# Patient Record
Sex: Female | Born: 1950 | Race: Black or African American | Hispanic: No | Smoking: Current every day smoker
Health system: Southern US, Community
[De-identification: ages and names within clinical notes are randomized; demographics above are authoritative.]

## PROBLEM LIST (undated history)

## (undated) DIAGNOSIS — Z87442 Personal history of urinary calculi: Secondary | ICD-10-CM

## (undated) DIAGNOSIS — A630 Anogenital (venereal) warts: Secondary | ICD-10-CM

## (undated) DIAGNOSIS — F17200 Nicotine dependence, unspecified, uncomplicated: Secondary | ICD-10-CM

## (undated) DIAGNOSIS — R911 Solitary pulmonary nodule: Secondary | ICD-10-CM

## (undated) DIAGNOSIS — I10 Essential (primary) hypertension: Secondary | ICD-10-CM

## (undated) DIAGNOSIS — T7840XA Allergy, unspecified, initial encounter: Secondary | ICD-10-CM

## (undated) DIAGNOSIS — C3491 Malignant neoplasm of unspecified part of right bronchus or lung: Secondary | ICD-10-CM

## (undated) DIAGNOSIS — H269 Unspecified cataract: Secondary | ICD-10-CM

## (undated) DIAGNOSIS — E785 Hyperlipidemia, unspecified: Secondary | ICD-10-CM

## (undated) DIAGNOSIS — K219 Gastro-esophageal reflux disease without esophagitis: Secondary | ICD-10-CM

## (undated) HISTORY — DX: Gastro-esophageal reflux disease without esophagitis: K21.9

## (undated) HISTORY — DX: Anogenital (venereal) warts: A63.0

## (undated) HISTORY — PX: OOPHORECTOMY: SHX86

## (undated) HISTORY — PX: ABDOMINAL HYSTERECTOMY: SHX81

## (undated) HISTORY — DX: Hyperlipidemia, unspecified: E78.5

## (undated) HISTORY — DX: Solitary pulmonary nodule: R91.1

## (undated) HISTORY — DX: Nicotine dependence, unspecified, uncomplicated: F17.200

## (undated) HISTORY — DX: Unspecified cataract: H26.9

## (undated) HISTORY — DX: Allergy, unspecified, initial encounter: T78.40XA

---

## 2014-01-28 ENCOUNTER — Emergency Department: Payer: Self-pay | Admitting: Emergency Medicine

## 2015-02-18 ENCOUNTER — Ambulatory Visit: Payer: Self-pay | Admitting: Family Medicine

## 2015-11-20 ENCOUNTER — Emergency Department: Payer: Commercial Managed Care - HMO

## 2015-11-20 ENCOUNTER — Encounter: Payer: Self-pay | Admitting: Emergency Medicine

## 2015-11-20 ENCOUNTER — Emergency Department
Admission: EM | Admit: 2015-11-20 | Discharge: 2015-11-20 | Disposition: A | Discharge status: Home / Self Care | Payer: Commercial Managed Care - HMO | Attending: Emergency Medicine | Admitting: Emergency Medicine

## 2015-11-20 DIAGNOSIS — M7121 Synovial cyst of popliteal space [Baker], right knee: Secondary | ICD-10-CM

## 2015-11-20 DIAGNOSIS — M79604 Pain in right leg: Secondary | ICD-10-CM | POA: Diagnosis present

## 2015-11-20 DIAGNOSIS — F17210 Nicotine dependence, cigarettes, uncomplicated: Secondary | ICD-10-CM | POA: Insufficient documentation

## 2015-11-20 MED ORDER — FUROSEMIDE 20 MG PO TABS: 20.0000 mg | ORAL_TABLET | Freq: Every day | ORAL | 0 refills | Status: DC

## 2015-11-20 MED ORDER — TRAMADOL HCL 50 MG PO TABS: 50.0000 mg | ORAL_TABLET | Freq: Two times a day (BID) | ORAL | 0 refills | Status: DC | PRN

## 2015-11-20 NOTE — ED Provider Notes (Signed)
[facility] Emergency Department Provider Note   ____________________________________________   First MD Initiated Contact with Patient 11/20/15 1429     (approximate)  I have reviewed the triage vital signs and the nursing notes.   HISTORY  Chief Complaint Leg Pain    HPI Joanna Morris is a 65 y.o. female history complaining of right leg pain for 1 week. Patient denies any provocative incident for this complaint. Patient stated pain is located in the popliteal area of the right leg. No palliative measures for this complaint. Patient denies any chest pain or shortness of breath. Patient rated the pain as a 10 over 10. Patient described a pain as "achy". Patient states she's noticed some swelling behind her right knee. Patient stated palpation of the popliteal area is uncomfortable.   History reviewed. No pertinent past medical history.  There are no active problems to display for this patient.   Past Surgical History:  Procedure Laterality Date  . ABDOMINAL HYSTERECTOMY      Prior to Admission medications   Medication Sig Start Date End Date Taking? Authorizing Provider  furosemide (LASIX) 20 MG tablet Take 1 tablet (20 mg total) by mouth daily. 11/20/15 11/19/16  Sable Feil, PA-C  traMADol (ULTRAM) 50 MG tablet Take 1 tablet (50 mg total) by mouth every 12 (twelve) hours as needed for moderate pain. 11/20/15   Sable Feil, PA-C    Allergies Review of patient's allergies indicates no known allergies.  No family history on file.  Social History Social History  Substance Use Topics  . Smoking status: Current Every Day Smoker    Packs/day: 1.00    Types: Cigarettes  . Smokeless tobacco: Not on file  . Alcohol use Not on file    Review of Systems Constitutional: No fever/chills Eyes: No visual changes. ENT: No sore throat. Cardiovascular: Denies chest pain. Respiratory: Denies shortness of breath. Gastrointestinal: No abdominal  pain.  No nausea, no vomiting.  No diarrhea.  No constipation. Genitourinary: Negative for dysuria. Musculoskeletal: Right leg pain Skin: Negative for rash. Neurological: Negative for headaches, focal weakness or numbness.    ____________________________________________   PHYSICAL EXAM:  VITAL SIGNS: ED Triage Vitals  Enc Vitals Group     BP 11/20/15 1239 (!) 146/86     Pulse Rate 11/20/15 1239 69     Resp 11/20/15 1239 18     Temp 11/20/15 1239 97.6 F (36.4 C)     Temp Source 11/20/15 1239 Oral     SpO2 11/20/15 1239 94 %     Weight 11/20/15 1240 200 lb (90.7 kg)     Height 11/20/15 1240 '5\' 1"'$  (1.549 m)     Head Circumference --      Peak Flow --      Pain Score 11/20/15 1240 10     Pain Loc --      Pain Edu? --      Excl. in [place]? --     Constitutional: Alert and oriented. Well appearing and in no acute distress. Eyes: Conjunctivae are normal. PERRL. EOMI. Head: Atraumatic. Nose: No congestion/rhinnorhea. Mouth/Throat: Mucous membranes are moist.  Oropharynx non-erythematous. Neck: No stridor.  No cervical spine tenderness to palpation. Hematological/Lymphatic/Immunilogical: No cervical lymphadenopathy. Cardiovascular: Normal rate, regular rhythm. Grossly normal heart sounds.  Good peripheral circulation. Respiratory: Normal respiratory effort.  No retractions. Lungs CTAB. Gastrointestinal: Soft and nontender. No distention. No abdominal bruits. No CVA tenderness. Musculoskeletal:No obvious deformity of the right lower leg. There is  marked edema approximately 35 cm circumference at the superior aspect of the tibia.. Versus 32 cm of the left superior aspect of the tibia. Patient is marked guarding with palpation of the right popliteal area. Neurologic:  Normal speech and language. No gross focal neurologic deficits are appreciated. No gait instability. Skin:  Skin is warm, dry and intact. No rash noted. Psychiatric: Mood and affect are normal. Speech and behavior are  normal.  ____________________________________________   LABS (all labs ordered are listed, but only abnormal results are displayed)  Labs Reviewed - No data to display ____________________________________________  EKG   ____________________________________________  RADIOLOGY  Ultrasound was negative for DVT. Ultrasound did reveal a Baker's cyst in the right popliteal area. ____________________________________________   PROCEDURES  Procedure(s) performed: None  Procedures  Critical Care performed: No  ____________________________________________   INITIAL IMPRESSION / ASSESSMENT AND PLAN / ED COURSE  Pertinent labs & imaging results that were available during my care of the patient were reviewed by me and considered in my medical decision making (see chart for details).  Right leg pain and edema. Baker's cyst. Discussed ultrasound found with patient. Patient given discharge Instructions. Patient given a prescription for tramadol and Lasix. Patient advised to follow-up with scheduled appointment with PCP next week. Return by ER for condition worsens.  Clinical Course     ____________________________________________   FINAL CLINICAL IMPRESSION(S) / ED DIAGNOSES  Final diagnoses:  Right leg pain  Baker's cyst of knee, right      NEW MEDICATIONS STARTED DURING THIS VISIT:  New Prescriptions   FUROSEMIDE (LASIX) 20 MG TABLET    Take 1 tablet (20 mg total) by mouth daily.   TRAMADOL (ULTRAM) 50 MG TABLET    Take 1 tablet (50 mg total) by mouth every 12 (twelve) hours as needed for moderate pain.     Note:  This document was prepared using Dragon voice recognition software and may include unintentional dictation errors.    Sable Feil, PA-C 11/20/15 [place], MD 11/20/15 [id number]

## 2015-11-20 NOTE — ED Triage Notes (Signed)
R leg pain x 1 week, denies injury.

## 2015-11-20 NOTE — ED Notes (Signed)
Ultrasound resulted, pt moved to flex wait.

## 2015-12-10 ENCOUNTER — Ambulatory Visit (INDEPENDENT_AMBULATORY_CARE_PROVIDER_SITE_OTHER): Payer: Commercial Managed Care - HMO | Admitting: Family Medicine

## 2015-12-10 ENCOUNTER — Encounter: Payer: Self-pay | Admitting: Family Medicine

## 2015-12-10 VITALS — BP 140/90 | HR 79 | Temp 99.0°F | Resp 16 | Ht 62.0 in | Wt 209.8 lb

## 2015-12-10 DIAGNOSIS — I10 Essential (primary) hypertension: Secondary | ICD-10-CM | POA: Insufficient documentation

## 2015-12-10 DIAGNOSIS — M25561 Pain in right knee: Secondary | ICD-10-CM | POA: Diagnosis not present

## 2015-12-10 DIAGNOSIS — Z720 Tobacco use: Secondary | ICD-10-CM | POA: Diagnosis not present

## 2015-12-10 DIAGNOSIS — Z7689 Persons encountering health services in other specified circumstances: Secondary | ICD-10-CM

## 2015-12-10 DIAGNOSIS — Z87891 Personal history of nicotine dependence: Secondary | ICD-10-CM | POA: Insufficient documentation

## 2015-12-10 DIAGNOSIS — Z131 Encounter for screening for diabetes mellitus: Secondary | ICD-10-CM | POA: Diagnosis not present

## 2015-12-10 DIAGNOSIS — J3089 Other allergic rhinitis: Secondary | ICD-10-CM | POA: Insufficient documentation

## 2015-12-10 DIAGNOSIS — R030 Elevated blood-pressure reading, without diagnosis of hypertension: Secondary | ICD-10-CM | POA: Diagnosis not present

## 2015-12-10 DIAGNOSIS — Z13220 Encounter for screening for lipoid disorders: Secondary | ICD-10-CM | POA: Diagnosis not present

## 2015-12-10 DIAGNOSIS — E669 Obesity, unspecified: Secondary | ICD-10-CM | POA: Diagnosis not present

## 2015-12-10 MED ORDER — NAPROXEN 500 MG PO TABS: 500.0000 mg | ORAL_TABLET | Freq: Two times a day (BID) | ORAL | 0 refills | Status: DC

## 2015-12-10 NOTE — Assessment & Plan Note (Signed)
Subacute Right medial knee pain >1 month now, with assoc localized swelling without current joint effusion. 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 Possibly consider just severe knee sprain.  Plan: 1. Maximize conservative therapy first - Naproxen 500 BID wc x 2-3 weeks regularly then PRN, Tylenol breakthrough 2. RICE therapy, knee compression sleeve, ice packs for swelling 3. Avoid over use and re injury 4. Hold imaging unlikely fracture, possible underlying OA contributing 5. Follow-up 4 weeks, if still persistent worsening, consider steroid injection and referral to Ortho for further eval

## 2015-12-10 NOTE — Assessment & Plan Note (Addendum)
Obese, BMI >38, limited prior follow-up, concern for elevated BP and possible new HTN dx. No recent DM screening, concern with family history - Future fasting lipid panel and A1c, chemistry ordered - Follow-up lifestyle recommendations

## 2015-12-10 NOTE — Assessment & Plan Note (Signed)
Active smoker, 0.5ppd >25 year. Prior quit for 2 years without treatment Not ready to quit today. Following smoking cessation.

## 2015-12-10 NOTE — Assessment & Plan Note (Signed)
Concern for borderline HTN vs new dx HTN, prior readings elevated. No prior formal dx. Most likely mild HTN given age and risk factors. Fam history. Recent acute Knee injury also confounding, pain may be raising BP. - Check labs with future fasting chemistry, lipids, A1c - Follow-up 4 weeks re-check BP, if still elevated, dx HTN and start low dose therapy

## 2015-12-10 NOTE — Patient Instructions (Signed)
Thank you for coming in to clinic today.  1. For your Knee pain - I think you sprained your knee, but am concerned you may have injured your Meniscus, shock absorber cartilage in knee  Recommend trial of Anti-inflammatory with Naproxen (Naprosyn) '500mg'$  tabs - take one with food and plenty of water TWICE daily every day (breakfast and dinner), for next 2 weeks, then you may take only as needed - DO NOT TAKE any ibuprofen, aleve, motrin while you are taking this medicine - It is safe to take Tylenol Ext Str '500mg'$  tabs - take 1 to 2 (max dose '1000mg'$ ) every 6 hours as needed for breakthrough pain, max 24 hour daily dose is 6 to 8 tablets or '4000mg'$   Keep using knee sleeve for compression, ice packs, and elevation (toes above nose, or above heart level) Avoid over use and try to limit activity  Blood pressure mildly elevated, we will check again.  If you are interested in quitting smoking let us know.  Mammogram screening, call number on card given.  Send [place] results from your Cologuard or colon cancer screening test  You will be due for fasting blood work before your next follow-up Two options: 1. About 1 to 2 weeks before your next appointment, please schedule a separate "Lab Only" visit here at the clinic to draw your blood only. Prefer early morning, open at 8:00am. You need to be fasting (No food or drink after midnight, and nothing except water / coffee (no cream or sugar) in the morning before your blood draw).   Please schedule a follow-up appointment with Dr. Parks Ranger in 4 weeks to follow-up Right Knee Pain / Lab results / Blood pressure re-check   If you have any other questions or concerns, please feel free to call the clinic or send a message through [place]. You may also schedule an earlier appointment if necessary.  Nobie Putnam, DO [place]

## 2015-12-10 NOTE — Progress Notes (Signed)
Subjective:    Patient ID: Joanna Morris, female    DOB: [birth year], 65 y.o.   MRN: [id number]  Joanna Morris is a 65 y.o. female presenting on 12/10/2015 for Establish Care  No prior PCP.  HPI  RIGHT KNEE PAIN MEDIAL: - Reports recent problem over past >1 month ago with acute Right knee injury. See recent record from ED 11/20/15, dx with R popliteal baker's cyst on [place] (negative for DVT) and given tramadol and lasix for edema. - Today patient reports persistent pain in Right knee. States initial injury while she was walking around a corner and turned to the left with right leg planted and twisted, she felt a "pop" behind her Right knee, and had some pain and her knee "locked up" difficulty straightening it. She was able to put weight on it initially. Overall still persistent and it is affecting her ambulation, pain is usually only with activities. Swelling has improved. Worse with driving and putting pressure on that leg. - No OTC medications. Tried Tramadol x1 from ED but made her itch so she stopped. Tried Lasix for swelling with improvement then stopped - Tried knee compression sleeve with improvement - No prior history of DVT. No prior history of knee injury, injection, surgery  ELEVATED BP / OBESITY BMI >38 - Reports prior history of some borderline elevated BP. No formal dx of HTN. Never treated with anti-HTN therapy. Thinks she has some family members with HTN but unsure. Mother passed with MI and had DM. - No known history of abnormal cholesterol, cannot recall last lab draw - Does not check BP at home  Seasonal Environmental Allergies: - Chronic problem, well controlled recently, uses Flonase as needed  TOBACCO ABUSE: - Chronic smoker >25 years, 0.5ppd currently. Quit only once before for 2 years, cold [place]. Never tried NRT - Not ready to quit.  Health Maintenance: - Last Flu shot she got sick, declines again, counseled on risks benefits today - She plans to order  cologuard through her insurance, and is awaiting on the kit to arrive, will forward results - Last mammo 2012, normal, wants repeat screen, no fam history of breast cancer   Past Medical History:  Diagnosis Date  . Allergy   . Cataract   . Genital warts   . GERD (gastroesophageal reflux disease)   . Kidney stone    Social History   Social History  . Marital status: Divorced    Spouse name: N/A  . Number of children: N/A  . Years of education: High School   Occupational History  . Retired    Social History Main Topics  . Smoking status: Current Every Day Smoker    Packs/day: 0.50    Years: 25.00    Types: Cigarettes  . Smokeless tobacco: Current User     Comment: Quit once for 2 years.  . Alcohol use Yes     Comment: 1-3 drinks every other week.  . Drug use: No  . Sexual activity: Not on file   Other Topics Concern  . Not on file   Social History Narrative  . No narrative on file   Family History  Problem Relation Age of Onset  . Heart disease Mother 61    MI  . Heart attack Mother 29  . Diabetes Mother    No current outpatient prescriptions on file prior to visit.   No current facility-administered medications on file prior to visit.     Review of Systems  Constitutional:  Negative for activity change, appetite change, chills, diaphoresis, fatigue and fever.  HENT: Negative for congestion, hearing loss and sinus pressure.   Eyes: Negative for visual disturbance.  Respiratory: Negative for cough, chest tightness, shortness of breath and wheezing.   Cardiovascular: Negative for chest pain, palpitations and leg swelling.  Gastrointestinal: Negative for abdominal pain, constipation, diarrhea, nausea and vomiting.  Endocrine: Negative for cold intolerance and polyuria.  Genitourinary: Negative for frequency and hematuria.  Musculoskeletal: Positive for arthralgias (Right knee), gait problem and joint swelling. Negative for back pain and neck pain.  Skin:  Negative for rash.  Allergic/Immunologic: Positive for environmental allergies.  Neurological: Negative for dizziness, weakness, light-headedness, numbness and headaches.  Hematological: Negative for adenopathy.  Psychiatric/Behavioral: Negative for behavioral problems, confusion, dysphoric mood and sleep disturbance.   Per HPI unless specifically indicated above     Objective:    BP 140/90 (BP Location: Left Arm, Cuff Size: Normal)   Pulse 79   Temp 99 F (37.2 C) (Oral)   Resp 16   Ht '5\' 2"'  (1.575 m)   Wt 209 lb 12.8 oz (95.2 kg)   BMI 38.37 kg/m   Wt Readings from Last 3 Encounters:  12/10/15 209 lb 12.8 oz (95.2 kg)  11/20/15 200 lb (90.7 kg)    Physical Exam  Constitutional: She is oriented to person, place, and time. She appears well-developed and well-nourished. No distress.  Well-appearing, comfortable, cooperative, obese  HENT:  Head: Normocephalic and atraumatic.  Mouth/Throat: Oropharynx is clear and moist.  Eyes: Conjunctivae and EOM are normal. Pupils are equal, round, and reactive to light.  Neck: Normal range of motion. Neck supple. No thyromegaly present.  Cardiovascular: Normal rate, regular rhythm, normal heart sounds and intact distal pulses.   No murmur heard. Pulmonary/Chest: Effort normal and breath sounds normal. No respiratory distress. She has no wheezes. She has no rales.  Abdominal: Soft. Bowel sounds are normal. She exhibits no distension and no mass. There is no tenderness.  Musculoskeletal: Normal range of motion. She exhibits no edema.  Bilateral Knees Inspection: Mild Right medial surrounding soft tissue edema, not consistent with joint effusion. No erythema. Palpation: Moderate +TTP Right knee only medial joint line and posteriorly. No crepitus ROM: mostly full active ROM, slightly limited R knee flexion due to pain. Special Testing: Lachman / Valgus/Varus tests negative with intact ligaments (ACL, MCL, LCL). McMurray R medial positive with  pain but no palpable pop or click. Standing Thessaly meniscus testing positive R medial for pain. Strength: 5/5 intact knee flex/ext, ankle dorsi/plantarflex Neurovascular: distally intact sensation light touch and pulses Gait is antalgic with favoring Left leg   Lymphadenopathy:    She has no cervical adenopathy.  Neurological: She is alert and oriented to person, place, and time.  Skin: Skin is warm and dry. No rash noted. She is not diaphoretic.  Psychiatric: She has a normal mood and affect. Her behavior is normal.  Nursing note and vitals reviewed.  Reviewed recent imaging radiology report 11/20/15 with Right Lower Extremity Venous US: IMPRESSION: No evidence of right lower extremity deep venous thrombosis. Small right popliteal fossa Baker's cyst.   No results found for this or any previous visit.    Assessment & Plan:   Problem List Items Addressed This Visit    Tobacco abuse    Active smoker, 0.5ppd >25 year. Prior quit for 2 years without treatment Not ready to quit today. Following smoking cessation.      Right medial knee pain - Primary  Subacute Right medial knee pain >1 month now, with assoc localized swelling without current joint effusion. 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 Possibly consider just severe knee sprain.  Plan: 1. Maximize conservative therapy first - Naproxen 500 BID wc x 2-3 weeks regularly then PRN, Tylenol breakthrough 2. RICE therapy, knee compression sleeve, ice packs for swelling 3. Avoid over use and re injury 4. Hold imaging unlikely fracture, possible underlying OA contributing 5. Follow-up 4 weeks, if still persistent worsening, consider steroid injection and referral to Ortho  for further eval      Relevant Medications   naproxen (NAPROSYN) 500 MG tablet   Obesity (BMI 35.0-39.9 without comorbidity)    Obese, BMI >38, limited prior follow-up, concern for elevated BP and possible new HTN dx. No recent DM screening, concern with family history - Future fasting lipid panel and A1c, chemistry ordered - Follow-up lifestyle recommendations      Relevant Orders   COMPLETE METABOLIC PANEL WITH GFR   Lipid panel   Hemoglobin A1c   Environmental and seasonal allergies    Stable, continue Flonase PRN      Elevated blood pressure reading    Concern for borderline HTN vs new dx HTN, prior readings elevated. No prior formal dx. Most likely mild HTN given age and risk factors. Fam history. Recent acute Knee injury also confounding, pain may be raising BP. - Check labs with future fasting chemistry, lipids, A1c - Follow-up 4 weeks re-check BP, if still elevated, dx HTN and start low dose therapy      Relevant Orders   COMPLETE METABOLIC PANEL WITH GFR   Lipid panel   Hemoglobin A1c    Other Visit Diagnoses    Encounter to establish care with new doctor       Screening cholesterol level       Relevant Orders   Lipid panel   Screening for diabetes mellitus       Relevant Orders   Hemoglobin A1c      Meds ordered this encounter  Medications  . naproxen (NAPROSYN) 500 MG tablet    Sig: Take 1 tablet (500 mg total) by mouth 2 (two) times daily with a meal. For 2 weeks, then as needed    Dispense:  60 tablet    Refill:  0      Follow up plan: Return in about 4 weeks (around 01/07/2016) for R knee, , blood pressure, labs.  Nobie Putnam, [facility] 12/10/2015, 5:27 PM

## 2015-12-10 NOTE — Assessment & Plan Note (Signed)
Stable, continue Flonase PRN

## 2015-12-22 LAB — IFOBT (OCCULT BLOOD): IMMUNOLOGICAL FECAL OCCULT BLOOD TEST: NEGATIVE

## 2015-12-23 ENCOUNTER — Other Ambulatory Visit: Payer: Self-pay

## 2015-12-23 DIAGNOSIS — Z131 Encounter for screening for diabetes mellitus: Secondary | ICD-10-CM

## 2015-12-23 DIAGNOSIS — E669 Obesity, unspecified: Secondary | ICD-10-CM

## 2015-12-23 DIAGNOSIS — Z13220 Encounter for screening for lipoid disorders: Secondary | ICD-10-CM

## 2015-12-23 DIAGNOSIS — R030 Elevated blood-pressure reading, without diagnosis of hypertension: Secondary | ICD-10-CM

## 2015-12-24 ENCOUNTER — Other Ambulatory Visit: Payer: Commercial Managed Care - HMO

## 2015-12-24 LAB — COMPLETE METABOLIC PANEL WITH GFR
ALT: 30 U/L — ABNORMAL HIGH (ref 6–29)
AST: 27 U/L (ref 10–35)
Albumin: 3.9 g/dL (ref 3.6–5.1)
Alkaline Phosphatase: 49 U/L (ref 33–130)
BUN: 17 mg/dL (ref 7–25)
CO2: 31 mmol/L (ref 20–31)
Calcium: 9 mg/dL (ref 8.6–10.4)
Chloride: 105 mmol/L (ref 98–110)
Creat: 0.86 mg/dL (ref 0.50–0.99)
GFR, Est African American: 81 mL/min (ref >=60)
GFR, Est Non African American: 70 mL/min (ref >=60)
Glucose, Bld: 105 mg/dL — ABNORMAL HIGH (ref 65–99)
Potassium: 4.7 mmol/L (ref 3.5–5.3)
Sodium: 142 mmol/L (ref 135–146)
Total Bilirubin: 0.6 mg/dL (ref 0.2–1.2)
Total Protein: 6.8 g/dL (ref 6.1–8.1)

## 2015-12-24 LAB — LIPID PANEL
CHOLESTEROL: 192 mg/dL (ref 125–200)
HDL: 54 mg/dL (ref >=46)
LDL CALC: 126 mg/dL (ref <130)
TRIGLYCERIDES: 62 mg/dL (ref <150)
Total CHOL/HDL Ratio: 3.6 Ratio (ref <=5.0)
VLDL: 12 mg/dL (ref <30)

## 2015-12-25 LAB — HEMOGLOBIN A1C
Hgb A1c MFr Bld: 5.5 % (ref <5.7)
Mean Plasma Glucose: 111 mg/dL

## 2016-01-07 ENCOUNTER — Encounter: Payer: Self-pay | Admitting: Family Medicine

## 2016-01-07 ENCOUNTER — Ambulatory Visit (INDEPENDENT_AMBULATORY_CARE_PROVIDER_SITE_OTHER): Payer: Commercial Managed Care - HMO | Admitting: Family Medicine

## 2016-01-07 VITALS — BP 152/78 | HR 72 | Temp 99.3°F | Resp 16 | Ht 62.0 in | Wt 215.6 lb

## 2016-01-07 DIAGNOSIS — M25561 Pain in right knee: Secondary | ICD-10-CM | POA: Diagnosis not present

## 2016-01-07 DIAGNOSIS — R030 Elevated blood-pressure reading, without diagnosis of hypertension: Secondary | ICD-10-CM | POA: Diagnosis not present

## 2016-01-07 MED ORDER — LIDOCAINE HCL (PF) 1 % IJ SOLN
4.0000 mL | Freq: Once | INTRAMUSCULAR | Status: AC
  Administered 2016-01-07: 4 mL

## 2016-01-07 MED ORDER — METHYLPREDNISOLONE ACETATE 40 MG/ML IJ SUSP
40.0000 mg | Freq: Once | INTRAMUSCULAR | Status: AC
  Administered 2016-01-07: 40 mg via INTRA_ARTICULAR

## 2016-01-07 NOTE — Progress Notes (Signed)
Subjective:    Patient ID: Joanna Morris, female    DOB: 08/16/1950, 65 y.o.   MRN: [id number]  Joanna Morris is a 65 y.o. female presenting on 01/07/2016 for Knee Pain (Right side)  HPI  RIGHT KNEE PAIN MEDIAL: - Last visit with me at [facility] on 12/10/15 to establish care, discussed same problem with R medial knee pain, some initial concerns for possible meniscus injury, started on conservative therapy initially with naproxen regularly, activity modification, compression and ice, and advised close follow-up if not improving. - Today patient returns with persistent worsening R medial knee pain (see prior note for details on initial injury, did have a twisting mechanism with feeling a "pop" without traumatic impact). Now severe pain up 9/10 mostly constant, worse with walking, extending knee, and at night. Does admit to a new fall about 2 weeks ago, tried to "plant her weight" on her right leg and it "gave way", without any other injury, and now she is scared ambulating around her home. - Taking Naproxen '500mg'$  regularly BID for 2 weeks, now just as needed, with only minimal relief. Uses topical muscle rub only relieves pain briefly - She could not tolerate Tramadol from ED - No prior history of knee injury, injection, surgery - Denies any fevers/chills, redness of knee joint, other joint pain or swelling  ELEVATED BP without dx HTN - Last visit 12/2015 with elevated BP, but no formal dx of HTN. Never treated with anti-HTN therapy. Family history of HTN. She remains in acute pain and thinks this may be affecting her BP. Also active smoker. - Does not check BP outside of office but will be able to get a cuff - Denies chest pain, shortness of breath, headache, lightheadedness, dizziness, pre-syncope  Social History  Substance Use Topics  . Smoking status: Current Every Day Smoker    Packs/day: 0.50    Years: 25.00    Types: Cigarettes  . Smokeless tobacco: Current User     Comment: Quit  once for 2 years.  . Alcohol use Yes     Comment: 1-3 drinks every other week.   Review of Systems   Per HPI unless specifically indicated above      Objective:    BP (!) 152/78 (BP Location: Right Arm, Cuff Size: Normal)   Pulse 72   Temp 99.3 F (37.4 C) (Oral)   Resp 16   Ht '5\' 2"'$  (1.575 m)   Wt 215 lb 9.6 oz (97.8 kg)   BMI 39.43 kg/m   Wt Readings from Last 3 Encounters:  01/07/16 215 lb 9.6 oz (97.8 kg)  12/10/15 209 lb 12.8 oz (95.2 kg)  11/20/15 200 lb (90.7 kg)    Physical Exam  Constitutional: She is oriented to person, place, and time. She appears well-developed and well-nourished. No distress.  Well-appearing, comfortable, cooperative, obese  HENT:  Head: Normocephalic and atraumatic.  Mouth/Throat: Oropharynx is clear and moist.  Neck: Neck supple.  Cardiovascular: Normal rate and intact distal pulses.   Pulmonary/Chest: Effort normal.  Musculoskeletal: Normal range of motion. She exhibits no edema.  Bilateral Knees - Unchanged from last exam Inspection: Mild Right medial surrounding soft tissue edema, not consistent with joint effusion. No erythema. Palpation: Moderate +TTP Right knee only medial joint line and posteriorly. No crepitus ROM: mostly full active ROM, slightly limited R knee flexion due to pain. Special Testing: Lachman / Valgus/Varus tests negative with intact ligaments (ACL, MCL, LCL). McMurray R medial positive with pain but no  palpable pop or click. Standing Thessaly meniscus testing positive R medial for pain. Strength: 5/5 intact knee flex/ext, ankle dorsi/plantarflex Neurovascular: distally intact sensation light touch and pulses Gait is antalgic with favoring Left leg   Neurological: She is alert and oriented to person, place, and time.  Skin: Skin is warm and dry. No rash noted. She is not diaphoretic.  Nursing note and vitals reviewed.   ________________________________________________________ PROCEDURE NOTE Date: 01/07/16 Right  Knee corticosteroid injection Discussed benefits and risks (including pain, bleeding, infection, steroid flare). Verbal consent given by patient. Medication:  1 cc Depo-medrol '40mg'$  and 4 cc Lidocaine 1% without epi Time Out taken  Landmarks identified. Area cleansed with alcohol wipes.Using 21 gauge and 1, 1/2 inch needle, Right knee joint space was injected (with above listed medication) via medial approach cold spray used for superficial anesthetic.Sterile bandage placed.Patient tolerated procedure well without bleeding or paresthesias.No complications.   I have personally reviewed the following lab results from 12/23/15.  Results for orders placed or performed in visit on 12/23/15  COMPLETE METABOLIC PANEL WITH GFR  Result Value Ref Range   Sodium 142 135 - 146 mmol/L   Potassium 4.7 3.5 - 5.3 mmol/L   Chloride 105 98 - 110 mmol/L   CO2 31 20 - 31 mmol/L   Glucose, Bld 105 (H) 65 - 99 mg/dL   BUN 17 7 - 25 mg/dL   Creat 0.86 0.50 - 0.99 mg/dL   Total Bilirubin 0.6 0.2 - 1.2 mg/dL   Alkaline Phosphatase 49 33 - 130 U/L   AST 27 10 - 35 U/L   ALT 30 (H) 6 - 29 U/L   Total Protein 6.8 6.1 - 8.1 g/dL   Albumin 3.9 3.6 - 5.1 g/dL   Calcium 9.0 8.6 - 10.4 mg/dL   GFR, Est African American 81 >=60 mL/min   GFR, Est Non African American 70 >=60 mL/min  Lipid panel  Result Value Ref Range   Cholesterol 192 125 - 200 mg/dL   Triglycerides 62 <150 mg/dL   HDL 54 >=46 mg/dL   Total CHOL/HDL Ratio 3.6 <=5.0 Ratio   VLDL 12 <30 mg/dL   LDL Cholesterol 126 <130 mg/dL  Hemoglobin A1c  Result Value Ref Range   Hgb A1c MFr Bld 5.5 <5.7 %   Mean Plasma Glucose 111 mg/dL      Assessment & Plan:   Problem List Items Addressed This Visit    Right medial knee pain - Primary    Worsening subacute/chronic R medial knee pain now following twisting injury mechanism. Still significant concern for meniscus injury from exam and history, especially with mechanical symptoms. No improvement on  NSAID conservative therapy. Less likely sprained Knee with worsening now.  Plan: 1. Received R knee corticosteroid injection today, see procedure note for details, tolerated well, counseled on may have initial steroid flare within 24 hours, relative rest next 1-2 days 2. Referral to [facility] for further imaging and evaluation, may need arthroscopic vs surgical management if torn meniscus 3. May continue Naproxen '500mg'$  BID for few days then PRN 4. Maximize regular Tylenol dosing, continue RICE therapy as needed 5. Follow-up if not improving      Relevant Medications   methylPREDNISolone acetate (DEPO-MEDROL) injection 40 mg (Completed)   lidocaine (PF) (XYLOCAINE) 1 % injection 4 mL (Completed)   Other Relevant Orders   Ambulatory referral to Orthopedic Surgery   Elevated blood pressure reading    Still concern with borderline HTN, has risk factors with  fam history and smoker. However, do not want to diagnose patient with HTN given her acute pain with R knee injury. - Reviewed recent labs - Follow-up 4 weeks check BP outside office, bring log to next visit, if still elevated will likely need start anti-HTN therapy       Other Visit Diagnoses   None.     Meds ordered this encounter  Medications  . methylPREDNISolone acetate (DEPO-MEDROL) injection 40 mg  . lidocaine (PF) (XYLOCAINE) 1 % injection 4 mL      Follow up plan: Return in about 4 weeks (around 02/04/2016) for blood pressure.  Nobie Putnam, [facility] 01/07/2016, 12:53 PM

## 2016-01-07 NOTE — Assessment & Plan Note (Signed)
Worsening subacute/chronic R medial knee pain now following twisting injury mechanism. Still significant concern for meniscus injury from exam and history, especially with mechanical symptoms. No improvement on NSAID conservative therapy. Less likely sprained Knee with worsening now.  Plan: 1. Received R knee corticosteroid injection today, see procedure note for details, tolerated well, counseled on may have initial steroid flare within 24 hours, relative rest next 1-2 days 2. Referral to [facility] for further imaging and evaluation, may need arthroscopic vs surgical management if torn meniscus 3. May continue Naproxen '500mg'$  BID for few days then PRN 4. Maximize regular Tylenol dosing, continue RICE therapy as needed 5. Follow-up if not improving

## 2016-01-07 NOTE — Assessment & Plan Note (Signed)
Still concern with borderline HTN, has risk factors with fam history and smoker. However, do not want to diagnose patient with HTN given her acute pain with R knee injury. - Reviewed recent labs - Follow-up 4 weeks check BP outside office, bring log to next visit, if still elevated will likely need start anti-HTN therapy

## 2016-01-07 NOTE — Patient Instructions (Addendum)
Thank you for coming in to clinic today.  Steroid injection in R knee today. This may cause worsening pain overnight. Take it easy, rest, elevate leg above heart level, use ACE wrap compression or sleeve on your knee, ice packs will help swelling. Do light activity tomorrow.  Continue Naproxen '500mg'$  twice a day for next few days then go back to as needed  Recommend to start taking Tylenol Extra Strength '500mg'$  tabs - take 1 to 2 tabs (max '1000mg'$  per dose) every 6 hours for pain (take regularly, don't skip a dose for next 3-7 days), max 24 hour daily dose is 6 to 8 tablets or 3000 to '4000mg'$  - This is safe to take with Naproxen as needed  [facility] [place], [place]  [address] Phone: [phone]  They will call you with appointment  Check BP at home regularly for next few weeks, write down readings, if BP is consistently >140/90 or higher, let us know. If you are in persistent worse knee pain, then perhaps we wait until knee improves FIRST  Try to quit smoking. 1 800-QUIT NOW.  Please schedule a follow-up appointment with Dr. Parks Ranger in 1 to 3 months for BP re-check  If you have any other questions or concerns, please feel free to call the clinic or send a message through [place]. You may also schedule an earlier appointment if necessary.  Nobie Putnam, DO [place]

## 2016-01-13 ENCOUNTER — Telehealth: Payer: Self-pay | Admitting: Family Medicine

## 2016-01-13 NOTE — Telephone Encounter (Signed)
Auth# [id number] Valid from 01/13/16-07/19/16 Visit# 6

## 2016-01-13 NOTE — Telephone Encounter (Signed)
Cindy with Dr. Claiborne Billings [phone] at [facility] ortho needs a [place] referral for pt's appt on Nov 17th for M25.561.  Her call back is [phone]

## 2016-01-17 ENCOUNTER — Telehealth: Payer: Self-pay | Admitting: *Deleted

## 2016-01-17 NOTE — Telephone Encounter (Signed)
Patient aware FIT fecal testing results normal.[place]

## 2016-01-26 ENCOUNTER — Other Ambulatory Visit: Payer: Self-pay | Admitting: Orthopedic Surgery

## 2016-01-26 DIAGNOSIS — M25561 Pain in right knee: Secondary | ICD-10-CM

## 2016-02-04 ENCOUNTER — Encounter: Payer: Self-pay | Admitting: Family Medicine

## 2016-02-04 ENCOUNTER — Ambulatory Visit (INDEPENDENT_AMBULATORY_CARE_PROVIDER_SITE_OTHER): Payer: Medicare Other | Admitting: Family Medicine

## 2016-02-04 VITALS — BP 142/86 | HR 75 | Temp 98.5°F | Resp 16 | Ht 62.0 in | Wt 215.6 lb

## 2016-02-04 DIAGNOSIS — M25561 Pain in right knee: Secondary | ICD-10-CM | POA: Diagnosis not present

## 2016-02-04 DIAGNOSIS — I10 Essential (primary) hypertension: Secondary | ICD-10-CM | POA: Diagnosis not present

## 2016-02-04 DIAGNOSIS — Z720 Tobacco use: Secondary | ICD-10-CM

## 2016-02-04 MED ORDER — HYDROCHLOROTHIAZIDE 12.5 MG PO CAPS: 12.5000 mg | ORAL_CAPSULE | Freq: Every day | ORAL | 5 refills | Status: DC

## 2016-02-04 NOTE — Patient Instructions (Addendum)
Thank you for coming in to clinic today.  1. Started Hydrochlorothiazide 12.'5mg'$  daily for BP, you are still borderline elevated BP 140s. Take same time every day in morning. It is a mild fluid pill may cause you to urinate some more and may help reduce swelling - Continue to try low salt diet, high vegetables - When your knee is repaired, work on gentle walking regular exercise - in the future keep trying to cut back on cigarettes, 1 800-QUIT NOW  CHeck BP at home and write down readings, once a day different times of day  Goal is BP < 140 / 90  Please schedule a follow-up appointment with Dr. Parks Ranger in 3 months for BP re-check, if concerned about still elevated BP you can return in 1 month for a NURSE ONLY BP check  If you have any other questions or concerns, please feel free to call the clinic or send a message through [place]. You may also schedule an earlier appointment if necessary.  Nobie Putnam, DO [place]

## 2016-02-04 NOTE — Assessment & Plan Note (Signed)
Not ready to quit, with continued knee pain and possible procedure Given resources, [place] Quitline, smoking cessation today, risks of inc BP with smoking Follow-up few months, then re-consider cessation

## 2016-02-04 NOTE — Assessment & Plan Note (Signed)
Stable, continued pain. Follow-up as scheduled with R knee MRI and Dr Marry Guan at [facility], suspects may need arthroscopic procedure if indeed degenerative mensicus injury

## 2016-02-04 NOTE — Progress Notes (Signed)
Subjective:    Patient ID: Joanna Morris, female    DOB: 07/24/1950, 65 y.o.   MRN: [id number]  Joanna Morris is a 65 y.o. female presenting on 02/04/2016 for Hypertension  HPI  HYPERTENSION, new diagnosis: - Last visit with me 01/07/16 for R knee pain, and I have seen her twice in past 2 months with elevated blood pressure. She did not have a prior diagnosis of HTN, and reports her pressure had been normal most of her life sometimes told "borderline HTN". She did not check BP outside office, as her home cuff is out of batteries, waiting to be replaced. She does have family history of HTN. - Never taken any anti-HTN medications - Tries to eat healthy diet but does admit to eating salty foods and various red meat as well - Recently concern with acute on chronic R knee pain contributing to elevated BP - Denies chest pain, shortness of breath, headache, lightheadedness, dizziness, pre-syncope  RIGHT KNEE PAIN MEDIAL: - Last seen by me twice in past 2 months for this complaint, recently established with [facility] Dr Marry Guan, on 11/17, she has had a steroid injection, and medical management without significant improvement, she is scheduled for MRI R knee on 02/10/16, suspected to have meniscus injury, awaiting further results and management per ortho. Today no new concerns, still admits to knee pain. - Denies any fevers/chills, redness of knee joint, other joint pain or swelling  TOBACCO ABUSE: - Chronic smoker >25 years, 0.5ppd currently. Quit only once before for 2 years, cold [place]. Never tried NRT - Not ready to quit, with upcoming likely knee procedure and pain, but understands she needs to cutback and quit in the future, to help her BP  Social History  Substance Use Topics  . Smoking status: Current Every Day Smoker    Packs/day: 0.50    Years: 25.00    Types: Cigarettes  . Smokeless tobacco: Current User     Comment: Quit once for 2 years.  . Alcohol use Yes   Comment: 1-3 drinks every other week.   Review of Systems Per HPI unless specifically indicated above      Objective:    BP (!) 142/86 (BP Location: Left Arm, Cuff Size: Normal)   Pulse 75   Temp 98.5 F (36.9 C) (Oral)   Resp 16   Ht '5\' 2"'$  (1.575 m)   Wt 215 lb 9.6 oz (97.8 kg)   BMI 39.43 kg/m   Wt Readings from Last 3 Encounters:  02/04/16 215 lb 9.6 oz (97.8 kg)  01/07/16 215 lb 9.6 oz (97.8 kg)  12/10/15 209 lb 12.8 oz (95.2 kg)    Physical Exam  Constitutional: She is oriented to person, place, and time. She appears well-developed and well-nourished. No distress.  Well-appearing, comfortable, cooperative, obese  HENT:  Head: Normocephalic and atraumatic.  Mouth/Throat: Oropharynx is clear and moist.  Neck: Neck supple.  Cardiovascular: Normal rate, regular rhythm, normal heart sounds and intact distal pulses.   No murmur heard. Pulmonary/Chest: Effort normal and breath sounds normal. No respiratory distress. She has no wheezes. She has no rales.  Musculoskeletal: She exhibits no edema.  Neurological: She is alert and oriented to person, place, and time.  Skin: Skin is warm and dry. She is not diaphoretic.  Nursing note and vitals reviewed.  I have personally reviewed the following lab results from 12/23/15.  Results for orders placed or performed in visit on 12/23/15  COMPLETE METABOLIC PANEL WITH GFR  Result Value Ref Range   Sodium 142 135 - 146 mmol/L   Potassium 4.7 3.5 - 5.3 mmol/L   Chloride 105 98 - 110 mmol/L   CO2 31 20 - 31 mmol/L   Glucose, Bld 105 (H) 65 - 99 mg/dL   BUN 17 7 - 25 mg/dL   Creat 0.86 0.50 - 0.99 mg/dL   Total Bilirubin 0.6 0.2 - 1.2 mg/dL   Alkaline Phosphatase 49 33 - 130 U/L   AST 27 10 - 35 U/L   ALT 30 (H) 6 - 29 U/L   Total Protein 6.8 6.1 - 8.1 g/dL   Albumin 3.9 3.6 - 5.1 g/dL   Calcium 9.0 8.6 - 10.4 mg/dL   GFR, Est African American 81 >=60 mL/min   GFR, Est Non African American 70 >=60 mL/min  Lipid panel  Result  Value Ref Range   Cholesterol 192 125 - 200 mg/dL   Triglycerides 62 <150 mg/dL   HDL 54 >=46 mg/dL   Total CHOL/HDL Ratio 3.6 <=5.0 Ratio   VLDL 12 <30 mg/dL   LDL Cholesterol 126 <130 mg/dL  Hemoglobin A1c  Result Value Ref Range   Hgb A1c MFr Bld 5.5 <5.7 %   Mean Plasma Glucose 111 mg/dL      Assessment & Plan:   Problem List Items Addressed This Visit    Tobacco abuse    Not ready to quit, with continued knee pain and possible procedure Given resources, [place] Quitline, smoking cessation today, risks of inc BP with smoking Follow-up few months, then re-consider cessation      Right medial knee pain    Stable, continued pain. Follow-up as scheduled with R knee MRI and Dr Marry Guan at [facility], suspects may need arthroscopic procedure if indeed degenerative mensicus injury      Hypertension - Primary    New diagnosis HTN today, given multiple values >140/90 in past, suspect some elevation with acute on chronic R knee pain likely torn meniscus, however given family history, risk factor with obesity BMI 31, age suspect she would benefit from HTN management  Plan: 1. Discussion today on chronic HTN and complications etc, reasons for managing, and preventing problems with ASCVD, mutual agreement to go ahead and start anti-HTN therapy today 2. Start low dose HCTZ 12.'5mg'$  daily, recent labs reviewed 3. Check BP at home, handout given for log, bring to next visit 4. Follow-up 1 to 3 months re-check BP, may do nurse visit or see me, future consider add low dose CCB amlodipine 2.5 to 5 if any additional treatment needed. Smoking cessation counseling once knee pain is resolved      Relevant Medications   hydrochlorothiazide (MICROZIDE) 12.5 MG capsule      Meds ordered this encounter  Medications  . hydrochlorothiazide (MICROZIDE) 12.5 MG capsule    Sig: Take 1 capsule (12.5 mg total) by mouth daily.    Dispense:  30 capsule    Refill:  5      Follow up plan: Return in  about 3 months (around 05/04/2016) for blood pressure.  Joanna Morris, [facility] 02/04/2016, 1:02 PM

## 2016-02-04 NOTE — Assessment & Plan Note (Signed)
New diagnosis HTN today, given multiple values >140/90 in past, suspect some elevation with acute on chronic R knee pain likely torn meniscus, however given family history, risk factor with obesity BMI 73, age suspect she would benefit from HTN management  Plan: 1. Discussion today on chronic HTN and complications etc, reasons for managing, and preventing problems with ASCVD, mutual agreement to go ahead and start anti-HTN therapy today 2. Start low dose HCTZ 12.'5mg'$  daily, recent labs reviewed 3. Check BP at home, handout given for log, bring to next visit 4. Follow-up 1 to 3 months re-check BP, may do nurse visit or see me, future consider add low dose CCB amlodipine 2.5 to 5 if any additional treatment needed. Smoking cessation counseling once knee pain is resolved

## 2016-02-10 ENCOUNTER — Ambulatory Visit
Admission: RE | Admit: 2016-02-10 | Discharge: 2016-02-10 | Disposition: A | Discharge status: Home / Self Care | Payer: Medicare Other | Source: Ambulatory Visit | Attending: Orthopedic Surgery | Admitting: Orthopedic Surgery

## 2016-02-10 DIAGNOSIS — M25561 Pain in right knee: Secondary | ICD-10-CM | POA: Diagnosis present

## 2016-02-10 DIAGNOSIS — X58XXXA Exposure to other specified factors, initial encounter: Secondary | ICD-10-CM | POA: Diagnosis not present

## 2016-02-10 DIAGNOSIS — S83241A Other tear of medial meniscus, current injury, right knee, initial encounter: Secondary | ICD-10-CM | POA: Diagnosis not present

## 2016-04-13 DIAGNOSIS — M2391 Unspecified internal derangement of right knee: Secondary | ICD-10-CM | POA: Diagnosis not present

## 2016-05-06 NOTE — Discharge Instructions (Signed)
°  Instructions after Knee Arthroscopy    Vint Pola P. Holley Bouche., M.D.     Dept. of [facility]  [address], [place]  [address]   Phone: [phone]   Fax: [phone]   DIET:  Drink plenty of non-alcoholic fluids & begin a light diet.  Resume your normal diet the day after surgery.  ACTIVITY:   You may use crutches or a walker with weight-bearing as tolerated, unless instructed otherwise.  You may wean yourself off of the walker or crutches as tolerated.   Begin doing gentle exercises. Exercising will reduce the pain and swelling, increase motion, and prevent muscle weakness.    Avoid strenuous activities or athletics for a minimum of 4-6 weeks after arthroscopic surgery.  Do not drive or operate any equipment until instructed.  WOUND CARE:   Place one to two pillows under the knee the first day or two when sitting or lying.   Continue to use the ice packs periodically to reduce pain and swelling.  The small incisions in your knee are closed with nylon stitches. The stitches will be removed in the office.  The bulky dressing may be removed on the second day after surgery. DO NOT TOUCH THE STITCHES. Put a Band-Aid over each stitch. Do NOT use any ointments or creams on the incisions.   You may bathe or shower after the stitches are removed at the first office visit following surgery.  MEDICATIONS:  You may resume your regular medications.  Please take the pain medication as prescribed.  Do not take pain medication on an empty stomach.  Do not drive or drink alcoholic beverages when taking pain medications.  CALL THE OFFICE FOR:  Temperature above 101 degrees  Excessive bleeding or drainage on the dressing.  Excessive swelling, coldness, or paleness of the toes.  Persistent nausea and vomiting.  FOLLOW-UP:   You should have an appointment to return to the office in 7-10 days after surgery.

## 2016-05-12 ENCOUNTER — Encounter: Payer: Self-pay | Admitting: Family Medicine

## 2016-05-12 ENCOUNTER — Ambulatory Visit (INDEPENDENT_AMBULATORY_CARE_PROVIDER_SITE_OTHER): Payer: Medicare Other | Admitting: Family Medicine

## 2016-05-12 DIAGNOSIS — I10 Essential (primary) hypertension: Secondary | ICD-10-CM

## 2016-05-12 DIAGNOSIS — Z720 Tobacco use: Secondary | ICD-10-CM | POA: Diagnosis not present

## 2016-05-12 DIAGNOSIS — Z13820 Encounter for screening for osteoporosis: Secondary | ICD-10-CM | POA: Diagnosis not present

## 2016-05-12 DIAGNOSIS — Z1239 Encounter for other screening for malignant neoplasm of breast: Secondary | ICD-10-CM

## 2016-05-12 DIAGNOSIS — Z780 Asymptomatic menopausal state: Secondary | ICD-10-CM | POA: Diagnosis not present

## 2016-05-12 DIAGNOSIS — Z1231 Encounter for screening mammogram for malignant neoplasm of breast: Secondary | ICD-10-CM | POA: Diagnosis not present

## 2016-05-12 NOTE — Progress Notes (Signed)
Subjective:    Patient ID: Joanna Morris, female    DOB: 07/08/1950, 66 y.o.   MRN: [id number]  Joanna Morris is a 66 y.o. female presenting on 05/12/2016 for Hypertension  HPI  CHRONIC HTN: - Recently diagnosed with HTN 02/2016, started on HCTZ 12.'5mg'$  daily, however patient states she did not realized had refills, only took intermittently to stretch out medication bottle, and missed many days, she did take one pill today, only has 3 left, plans to pick up refill today - Did not check BP outside of office, but admits can do this at pharmacy Meds - HCTZ 12.'5mg'$  daily, tolerating without side effects - Tries to eat healthy diet but does admit to eating salty foods - Still significant pain Right knee injury contributing to elevated BP, scheduled for Orthopedic surgery 3/19 - Denies chest pain, shortness of breath, headache, lightheadedness, dizziness, pre-syncope  TOBACCO ABUSE - Active smoker, not increased or reduced amount, stressed about upcoming knee arthroscopic surgery 05/2016  Health Maintenance: - Request repeat Mammo today she will schedule this after surgery (has health maintenance list of due items from insurance company), Last mammo 2012, normal, no fam history of breast cancer - Requesting DEXA scan, no history of osteoporosis, but she is estrogen deficient postmenopausal (surgical hysterectomy)  Social History  Substance Use Topics  . Smoking status: Current Every Day Smoker    Packs/day: 0.50    Years: 25.00    Types: Cigarettes  . Smokeless tobacco: Current User     Comment: Quit once for 2 years.  . Alcohol use Yes     Comment: 1-3 drinks every other week.   Review of Systems Per HPI unless specifically indicated above      Objective:    There were no vitals taken for this visit.  Wt Readings from Last 3 Encounters:  02/04/16 215 lb 9.6 oz (97.8 kg)  01/07/16 215 lb 9.6 oz (97.8 kg)  12/10/15 209 lb 12.8 oz (95.2 kg)    Physical Exam  Constitutional:  She is oriented to person, place, and time. She appears well-developed and well-nourished. No distress.  Well-appearing, comfortable, cooperative, obese  HENT:  Head: Normocephalic and atraumatic.  Mouth/Throat: Oropharynx is clear and moist.  Eyes: Conjunctivae are normal.  Neck: Neck supple.  Cardiovascular: Normal rate, regular rhythm, normal heart sounds and intact distal pulses.   No murmur heard. Pulmonary/Chest: Effort normal and breath sounds normal. No respiratory distress. She has no wheezes. She has no rales.  Musculoskeletal: She exhibits no edema.  Notable pain in Right knee, antalgic gait R knee not re-examined today  Neurological: She is alert and oriented to person, place, and time.  Skin: Skin is warm and dry. No rash noted. She is not diaphoretic.  Psychiatric: She has a normal mood and affect. Her behavior is normal.  Nursing note and vitals reviewed.    Chemistry      Component Value Date/Time   NA 142 12/23/2015 0807   K 4.7 12/23/2015 0807   CL 105 12/23/2015 0807   CO2 31 12/23/2015 0807   BUN 17 12/23/2015 0807   CREATININE 0.86 12/23/2015 0807      Component Value Date/Time   CALCIUM 9.0 12/23/2015 0807   ALKPHOS 49 12/23/2015 0807   AST 27 12/23/2015 0807   ALT 30 (H) 12/23/2015 0807   BILITOT 0.6 12/23/2015 0807          Assessment & Plan:   Problem List Items Addressed This Visit  Tobacco abuse    Not ready to quit Reviewed smoking cessation today, risks of inc BP with smoking Follow-up when ready      Hypertension - Primary    Still mild elevated BP, newly dx HTN 3 months ago with prior chronic history of borderline vs elevated BP Not adhering to HCTZ therapy, she was unaware of refills and missed many doses, did take one today by report No known complications  Plan: 1. Continue HCTZ 12.'5mg'$  for more stable dosing to see if helping - will not order future BMET as considered, since she is scheduled for pre-op labs within next 1-2  weeks for upcoming R knee arthroscopic surgery end of 05/2016 2. Again advised needs to check BP at pharmacy or home, write down readings, need more data 3. Encouraged plan for future after knee surgery, goals to work on exercise, quit smoking 4. Follow-up 3 months for HTN, then likely 3-4 months after that for 12/2016 Annual Physical and fasting labs       Other Visit Diagnoses    Osteoporosis screening       No prior DEXA, ordered now, handout given for pt to schedule   Relevant Orders   DG Bone Density   Breast cancer screening       Ordered mammogram, last 2012 normal, none in interval, handout given to schedule   Relevant Orders   MM DIGITAL SCREENING BILATERAL   Postmenopausal estrogen deficiency       Relevant Orders   DG Bone Density      No orders of the defined types were placed in this encounter.   Follow up plan: Return in about 3 months (around 08/12/2016) for blood pressure.  Nobie Putnam, [facility] 05/12/2016, 12:13 PM

## 2016-05-12 NOTE — Patient Instructions (Signed)
Thank you for coming in to clinic today.  1.  Keep taking HCTZ 12.'5mg'$  daily - make sure to keep up with your refills If about to run out or need new refills, please phone our office or the pharmacy to request this We can also provide 90 day supply if preferred Check BP at pharmacy at least once weekly. If BP is consistently >140/90, then notify us and we can send in additional BP pill, Amlodipine '5mg'$  daily, to take both together  [facility] [place], [place] [address] Phone: [phone] Call anytime to schedule once we have submitted order  [facility] [address] [place], [place] [address]  Good luck with your surgery, and we will stay tuned.  Let me know if you are interested to quit smoking.  Please schedule a follow-up appointment with Dr. Parks Ranger in 3 months for HTN, next visit will be Annual Physical 12/2016  If you have any other questions or concerns, please feel free to call the clinic or send a message through [place]. You may also schedule an earlier appointment if necessary.  Nobie Putnam, DO [place]

## 2016-05-12 NOTE — Assessment & Plan Note (Signed)
Not ready to quit Reviewed smoking cessation today, risks of inc BP with smoking Follow-up when ready

## 2016-05-12 NOTE — Assessment & Plan Note (Addendum)
Still mild elevated BP, newly dx HTN 3 months ago with prior chronic history of borderline vs elevated BP Not adhering to HCTZ therapy, she was unaware of refills and missed many doses, did take one today by report No known complications  Plan: 1. Continue HCTZ 12.'5mg'$  for more stable dosing to see if helping - will not order future BMET as considered, since she is scheduled for pre-op labs within next 1-2 weeks for upcoming R knee arthroscopic surgery end of 05/2016 2. Again advised needs to check BP at pharmacy or home, write down readings, need more data 3. Encouraged plan for future after knee surgery, goals to work on exercise, quit smoking 4. Follow-up 3 months for HTN, then likely 3-4 months after that for 12/2016 Annual Physical and fasting labs

## 2016-05-15 NOTE — Patient Instructions (Signed)
  Your procedure is scheduled on: 05-22-16 [facility]) Report to Same Day Surgery 2nd floor medical mall [facility] Entrance-take elevator on left to 2nd floor.  Check in with surgery information desk.) To find out your arrival time please call [phone] between 1PM - 3PM on 05-19-16 (FRIDAY)  Remember: Instructions that are not followed completely may result in serious medical risk, up to and including death, or upon the discretion of your surgeon and anesthesiologist your surgery may need to be rescheduled.    _x___ 1. Do not eat food or drink liquids after midnight. No gum chewing or hard candies.     __x__ 2. No Alcohol for 24 hours before or after surgery.   __x__3. No Smoking for 24 prior to surgery.   ____  4. Bring all medications with you on the day of surgery if instructed.    __x__ 5. Notify your doctor if there is any change in your medical condition     (cold, fever, infections).     Do not wear jewelry, make-up, hairpins, clips or nail polish.  Do not wear lotions, powders, or perfumes. You may wear deodorant.  Do not shave 48 hours prior to surgery. Men may shave face and neck.  Do not bring valuables to the hospital.    [facility] is not responsible for any belongings or valuables.               Contacts, dentures or bridgework may not be worn into surgery.  Leave your suitcase in the car. After surgery it may be brought to your room.  For patients admitted to the hospital, discharge time is determined by your treatment team.   Patients discharged the day of surgery will not be allowed to drive home.  You will need someone to drive you home and stay with you the night of your procedure.    Please read over the following fact sheets that you were given:   [facility] Preparing for Surgery and or MRSA Information   ____ Take anti-hypertensive (unless it includes a diuretic), cardiac, seizure, asthma, anti-reflux and psychiatric medicines. These include:  1.  NONE  2.  3.  4.  5.  6.  ____Fleets enema or Magnesium Citrate as directed.   _x___ Use CHG Soap or sage wipes as directed on instruction sheet   ____ Use inhalers on the day of surgery and bring to hospital day of surgery  ____ Stop Metformin and Janumet 2 days prior to surgery.    ____ Take 1/2 of usual insulin dose the night before surgery and none on the morning     surgery.   ____ Follow recommendations from Cardiologist, Pulmonologist or PCP regarding          stopping Aspirin, Coumadin, Pllavix ,Eliquis, Effient, or Pradaxa, and Pletal.  X____Stop Anti-inflammatories such as Advil, Aleve, Ibuprofen, Motrin, Naproxen, Naprosyn, Goodies powders or aspirin products NOW-OK to take Tylenol   ____ Stop supplements until after surgery.    ____ Bring C-Pap to the hospital.

## 2016-05-16 ENCOUNTER — Encounter
Admission: RE | Admit: 2016-05-16 | Discharge: 2016-05-16 | Disposition: A | Discharge status: Home / Self Care | Payer: Medicare Other | Source: Ambulatory Visit | Attending: Orthopedic Surgery | Admitting: Orthopedic Surgery

## 2016-05-16 DIAGNOSIS — Z01810 Encounter for preprocedural cardiovascular examination: Secondary | ICD-10-CM | POA: Diagnosis not present

## 2016-05-16 DIAGNOSIS — I10 Essential (primary) hypertension: Secondary | ICD-10-CM | POA: Insufficient documentation

## 2016-05-16 DIAGNOSIS — R9431 Abnormal electrocardiogram [ECG] [EKG]: Secondary | ICD-10-CM | POA: Diagnosis not present

## 2016-05-16 DIAGNOSIS — Z01812 Encounter for preprocedural laboratory examination: Secondary | ICD-10-CM | POA: Diagnosis not present

## 2016-05-16 HISTORY — DX: Personal history of urinary calculi: Z87.442

## 2016-05-16 HISTORY — DX: Essential (primary) hypertension: I10

## 2016-05-16 LAB — BASIC METABOLIC PANEL
Anion gap: 6 (ref 5–15)
BUN: 16 mg/dL (ref 6–20)
CO2: 33 mmol/L — AB (ref 22–32)
CREATININE: 0.68 mg/dL (ref 0.44–1.00)
Calcium: 9.4 mg/dL (ref 8.9–10.3)
Chloride: 100 mmol/L — ABNORMAL LOW (ref 101–111)
GFR calc non Af Amer: >60 mL/min (ref >60)
GLUCOSE: 107 mg/dL — AB (ref 65–99)
Potassium: 3.7 mmol/L (ref 3.5–5.1)
Sodium: 139 mmol/L (ref 135–145)

## 2016-05-16 LAB — CBC
HCT: 48.8 % — ABNORMAL HIGH (ref 35.0–47.0)
Hemoglobin: 16.1 g/dL — ABNORMAL HIGH (ref 12.0–16.0)
MCH: 29.7 pg (ref 26.0–34.0)
MCHC: 33 g/dL (ref 32.0–36.0)
MCV: 90 fL (ref 80.0–100.0)
PLATELETS: 140 10*3/uL — AB (ref 150–440)
RBC: 5.42 MIL/uL — ABNORMAL HIGH (ref 3.80–5.20)
RDW: 13.9 % (ref 11.5–14.5)
WBC: 5.6 10*3/uL (ref 3.6–11.0)

## 2016-05-21 MED ORDER — FAMOTIDINE 20 MG PO TABS
20.0000 mg | ORAL_TABLET | Freq: Once | ORAL | Status: AC
  Administered 2016-05-22: 20 mg via ORAL

## 2016-05-22 ENCOUNTER — Encounter: Payer: Self-pay | Admitting: Orthopedic Surgery

## 2016-05-22 ENCOUNTER — Encounter
Admission: RE | Disposition: A | Discharge status: Home / Self Care | Payer: Self-pay | Source: Ambulatory Visit | Attending: Orthopedic Surgery

## 2016-05-22 ENCOUNTER — Ambulatory Visit
Admission: RE | Admit: 2016-05-22 | Discharge: 2016-05-22 | Disposition: A | Discharge status: Home / Self Care | Payer: Medicare Other | Source: Ambulatory Visit | Attending: Orthopedic Surgery | Admitting: Orthopedic Surgery

## 2016-05-22 ENCOUNTER — Ambulatory Visit: Payer: Medicare Other | Admitting: Anesthesiology

## 2016-05-22 DIAGNOSIS — I10 Essential (primary) hypertension: Secondary | ICD-10-CM | POA: Diagnosis not present

## 2016-05-22 DIAGNOSIS — Y9301 Activity, walking, marching and hiking: Secondary | ICD-10-CM | POA: Diagnosis not present

## 2016-05-22 DIAGNOSIS — M23211 Derangement of anterior horn of medial meniscus due to old tear or injury, right knee: Secondary | ICD-10-CM | POA: Diagnosis not present

## 2016-05-22 DIAGNOSIS — M2391 Unspecified internal derangement of right knee: Secondary | ICD-10-CM | POA: Diagnosis present

## 2016-05-22 DIAGNOSIS — E669 Obesity, unspecified: Secondary | ICD-10-CM | POA: Diagnosis not present

## 2016-05-22 DIAGNOSIS — S83241A Other tear of medial meniscus, current injury, right knee, initial encounter: Secondary | ICD-10-CM | POA: Insufficient documentation

## 2016-05-22 DIAGNOSIS — F17200 Nicotine dependence, unspecified, uncomplicated: Secondary | ICD-10-CM | POA: Insufficient documentation

## 2016-05-22 DIAGNOSIS — X58XXXA Exposure to other specified factors, initial encounter: Secondary | ICD-10-CM | POA: Diagnosis not present

## 2016-05-22 DIAGNOSIS — Z79899 Other long term (current) drug therapy: Secondary | ICD-10-CM | POA: Insufficient documentation

## 2016-05-22 DIAGNOSIS — Z6841 Body Mass Index (BMI) 40.0 and over, adult: Secondary | ICD-10-CM | POA: Insufficient documentation

## 2016-05-22 HISTORY — PX: KNEE ARTHROSCOPY WITH MEDIAL MENISECTOMY: SHX5651

## 2016-05-22 SURGERY — ARTHROSCOPY, KNEE, WITH MEDIAL MENISCECTOMY
Anesthesia: General | Laterality: Right | Wound class: Clean

## 2016-05-22 MED ORDER — ACETAMINOPHEN 10 MG/ML IV SOLN
INTRAVENOUS | Status: DC | PRN
  Administered 2016-05-22: 1000 mg via INTRAVENOUS

## 2016-05-22 MED ORDER — SUCCINYLCHOLINE CHLORIDE 20 MG/ML IJ SOLN
INTRAMUSCULAR | Status: AC
  Filled 2016-05-22: qty 1

## 2016-05-22 MED ORDER — ACETAMINOPHEN 10 MG/ML IV SOLN
INTRAVENOUS | Status: AC
  Filled 2016-05-22: qty 100

## 2016-05-22 MED ORDER — FENTANYL CITRATE (PF) 100 MCG/2ML IJ SOLN
25.0000 ug | INTRAMUSCULAR | Status: DC | PRN
  Administered 2016-05-22 (×4): 25 ug via INTRAVENOUS

## 2016-05-22 MED ORDER — ONDANSETRON HCL 4 MG/2ML IJ SOLN: 4.0000 mg | Freq: Once | INTRAMUSCULAR | Status: DC | PRN

## 2016-05-22 MED ORDER — FENTANYL CITRATE (PF) 100 MCG/2ML IJ SOLN
INTRAMUSCULAR | Status: AC
  Administered 2016-05-22: 25 ug via INTRAVENOUS
  Filled 2016-05-22: qty 2

## 2016-05-22 MED ORDER — NALOXONE HCL 0.4 MG/ML IJ SOLN
INTRAMUSCULAR | Status: AC
  Filled 2016-05-22: qty 1

## 2016-05-22 MED ORDER — MORPHINE SULFATE (PF) 4 MG/ML IV SOLN
INTRAVENOUS | Status: DC | PRN
  Administered 2016-05-22: 4 mg

## 2016-05-22 MED ORDER — SUCCINYLCHOLINE CHLORIDE 20 MG/ML IJ SOLN
INTRAMUSCULAR | Status: DC | PRN
  Administered 2016-05-22: 120 mg via INTRAVENOUS

## 2016-05-22 MED ORDER — SUGAMMADEX SODIUM 200 MG/2ML IV SOLN
INTRAVENOUS | Status: AC
  Filled 2016-05-22: qty 2

## 2016-05-22 MED ORDER — ONDANSETRON HCL 4 MG/2ML IJ SOLN: 4.0000 mg | Freq: Four times a day (QID) | INTRAMUSCULAR | Status: DC | PRN

## 2016-05-22 MED ORDER — FENTANYL CITRATE (PF) 100 MCG/2ML IJ SOLN
INTRAMUSCULAR | Status: DC | PRN
  Administered 2016-05-22 (×2): 100 ug via INTRAVENOUS

## 2016-05-22 MED ORDER — ONDANSETRON HCL 4 MG/2ML IJ SOLN
INTRAMUSCULAR | Status: DC | PRN
  Administered 2016-05-22: 4 mg via INTRAVENOUS

## 2016-05-22 MED ORDER — BUPIVACAINE-EPINEPHRINE (PF) 0.25% -1:200000 IJ SOLN
INTRAMUSCULAR | Status: AC
  Filled 2016-05-22: qty 30

## 2016-05-22 MED ORDER — IPRATROPIUM-ALBUTEROL 0.5-2.5 (3) MG/3ML IN SOLN
3.0000 mL | Freq: Once | RESPIRATORY_TRACT | Status: AC
  Administered 2016-05-22: 3 mL via RESPIRATORY_TRACT

## 2016-05-22 MED ORDER — MIDAZOLAM HCL 2 MG/2ML IJ SOLN
INTRAMUSCULAR | Status: AC
  Filled 2016-05-22: qty 2

## 2016-05-22 MED ORDER — PROPOFOL 10 MG/ML IV BOLUS
INTRAVENOUS | Status: AC
  Filled 2016-05-22: qty 20

## 2016-05-22 MED ORDER — PHENYLEPHRINE HCL 10 MG/ML IJ SOLN
INTRAMUSCULAR | Status: DC | PRN
  Administered 2016-05-22 (×3): 100 ug via INTRAVENOUS

## 2016-05-22 MED ORDER — ROCURONIUM BROMIDE 50 MG/5ML IV SOLN
INTRAVENOUS | Status: AC
  Filled 2016-05-22: qty 1

## 2016-05-22 MED ORDER — ALBUTEROL SULFATE HFA 108 (90 BASE) MCG/ACT IN AERS
INHALATION_SPRAY | RESPIRATORY_TRACT | Status: AC
  Filled 2016-05-22: qty 6.7

## 2016-05-22 MED ORDER — FAMOTIDINE 20 MG PO TABS
ORAL_TABLET | ORAL | Status: AC
  Filled 2016-05-22: qty 1

## 2016-05-22 MED ORDER — METOCLOPRAMIDE HCL 5 MG/ML IJ SOLN: 5.0000 mg | Freq: Three times a day (TID) | INTRAMUSCULAR | Status: DC | PRN

## 2016-05-22 MED ORDER — DEXAMETHASONE SODIUM PHOSPHATE 10 MG/ML IJ SOLN
INTRAMUSCULAR | Status: AC
  Filled 2016-05-22: qty 2

## 2016-05-22 MED ORDER — LIDOCAINE HCL (CARDIAC) 20 MG/ML IV SOLN
INTRAVENOUS | Status: DC | PRN
  Administered 2016-05-22: 100 mg via INTRAVENOUS

## 2016-05-22 MED ORDER — IPRATROPIUM-ALBUTEROL 0.5-2.5 (3) MG/3ML IN SOLN
RESPIRATORY_TRACT | Status: AC
  Filled 2016-05-22: qty 3

## 2016-05-22 MED ORDER — LACTATED RINGERS IV SOLN
INTRAVENOUS | Status: DC
  Administered 2016-05-22 (×2): via INTRAVENOUS

## 2016-05-22 MED ORDER — MIDAZOLAM HCL 2 MG/2ML IJ SOLN
INTRAMUSCULAR | Status: DC | PRN
  Administered 2016-05-22: 2 mg via INTRAVENOUS

## 2016-05-22 MED ORDER — FENTANYL CITRATE (PF) 100 MCG/2ML IJ SOLN
INTRAMUSCULAR | Status: AC
  Filled 2016-05-22: qty 2

## 2016-05-22 MED ORDER — ONDANSETRON HCL 4 MG/2ML IJ SOLN
INTRAMUSCULAR | Status: AC
  Filled 2016-05-22: qty 2

## 2016-05-22 MED ORDER — MORPHINE SULFATE (PF) 4 MG/ML IV SOLN
INTRAVENOUS | Status: AC
  Filled 2016-05-22: qty 1

## 2016-05-22 MED ORDER — BUPIVACAINE-EPINEPHRINE (PF) 0.25% -1:200000 IJ SOLN
INTRAMUSCULAR | Status: DC | PRN
  Administered 2016-05-22: 25 mL via PERINEURAL
  Administered 2016-05-22: 5 mL via PERINEURAL

## 2016-05-22 MED ORDER — PROPOFOL 10 MG/ML IV BOLUS
INTRAVENOUS | Status: DC | PRN
Start: 2016-05-22 — End: 2016-05-22
  Administered 2016-05-22: 150 mg via INTRAVENOUS
  Administered 2016-05-22: 50 mg via INTRAVENOUS

## 2016-05-22 MED ORDER — LIDOCAINE HCL (PF) 2 % IJ SOLN
INTRAMUSCULAR | Status: AC
  Filled 2016-05-22: qty 2

## 2016-05-22 MED ORDER — SUGAMMADEX SODIUM 200 MG/2ML IV SOLN
INTRAVENOUS | Status: DC | PRN
  Administered 2016-05-22: 195 mg via INTRAVENOUS

## 2016-05-22 MED ORDER — HYDROCODONE-ACETAMINOPHEN 5-325 MG PO TABS: 1.0000 | ORAL_TABLET | ORAL | 0 refills | Status: DC | PRN

## 2016-05-22 MED ORDER — ONDANSETRON HCL 4 MG PO TABS: 4.0000 mg | ORAL_TABLET | Freq: Four times a day (QID) | ORAL | Status: DC | PRN

## 2016-05-22 MED ORDER — DEXAMETHASONE SODIUM PHOSPHATE 10 MG/ML IJ SOLN
INTRAMUSCULAR | Status: DC | PRN
  Administered 2016-05-22: 20 mg via INTRAVENOUS

## 2016-05-22 MED ORDER — CHLORHEXIDINE GLUCONATE 4 % EX LIQD: 60.0000 mL | Freq: Once | CUTANEOUS | Status: DC

## 2016-05-22 MED ORDER — ALBUTEROL SULFATE HFA 108 (90 BASE) MCG/ACT IN AERS
INHALATION_SPRAY | RESPIRATORY_TRACT | Status: DC | PRN
  Administered 2016-05-22: 5 via RESPIRATORY_TRACT

## 2016-05-22 MED ORDER — METOCLOPRAMIDE HCL 10 MG PO TABS: 5.0000 mg | ORAL_TABLET | Freq: Three times a day (TID) | ORAL | Status: DC | PRN

## 2016-05-22 MED ORDER — ROCURONIUM BROMIDE 100 MG/10ML IV SOLN
INTRAVENOUS | Status: DC | PRN
  Administered 2016-05-22: 50 mg via INTRAVENOUS

## 2016-05-22 SURGICAL SUPPLY — 30 items
ADAPTER IRRIG TUBE 2 SPIKE SOL (ADAPTER) ×6 IMPLANT
BLADE SHAVER 4.5 DBL SERAT CV (CUTTER) ×3 IMPLANT
BNDG ESMARK 6X12 TAN STRL LF (GAUZE/BANDAGES/DRESSINGS) ×3 IMPLANT
CUFF TOURN 24 STER (MISCELLANEOUS) IMPLANT
CUFF TOURN 30 STER DUAL PORT (MISCELLANEOUS) ×3 IMPLANT
DRSG DERMACEA 8X12 NADH (GAUZE/BANDAGES/DRESSINGS) ×3 IMPLANT
DURAPREP 26ML APPLICATOR (WOUND CARE) ×6 IMPLANT
GAUZE SPONGE 4X4 12PLY STRL (GAUZE/BANDAGES/DRESSINGS) ×3 IMPLANT
GLOVE BIO SURGEON STRL SZ7 (GLOVE) ×6 IMPLANT
GLOVE BIOGEL M STRL SZ7.5 (GLOVE) ×3 IMPLANT
GLOVE BIOGEL PI IND STRL 7.0 (GLOVE) ×2 IMPLANT
GLOVE BIOGEL PI INDICATOR 7.0 (GLOVE) ×4
GLOVE INDICATOR 8.0 STRL GRN (GLOVE) ×3 IMPLANT
GOWN STRL REUS W/ TWL LRG LVL3 (GOWN DISPOSABLE) ×2 IMPLANT
GOWN STRL REUS W/TWL LRG LVL3 (GOWN DISPOSABLE) ×4
IV LACTATED RINGER IRRG 3000ML (IV SOLUTION) ×12
IV LR IRRIG 3000ML ARTHROMATIC (IV SOLUTION) ×6 IMPLANT
KIT RM TURNOVER STRD PROC AR (KITS) ×3 IMPLANT
MANIFOLD NEPTUNE II (INSTRUMENTS) ×3 IMPLANT
NEEDLE FILTER BLUNT 18X 1/2SAF (NEEDLE) ×2
NEEDLE FILTER BLUNT 18X1 1/2 (NEEDLE) ×1 IMPLANT
PACK ARTHROSCOPY KNEE (MISCELLANEOUS) ×3 IMPLANT
SET TUBE SUCT SHAVER OUTFL 24K (TUBING) ×3 IMPLANT
SET TUBE TIP INTRA-ARTICULAR (MISCELLANEOUS) ×3 IMPLANT
SUT ETHILON 3-0 FS-10 30 BLK (SUTURE) ×3
SUTURE EHLN 3-0 FS-10 30 BLK (SUTURE) ×1 IMPLANT
SYR 3ML LL SCALE MARK (SYRINGE) ×3 IMPLANT
TUBING ARTHRO INFLOW-ONLY STRL (TUBING) ×6 IMPLANT
WAND HAND CNTRL MULTIVAC 50 (MISCELLANEOUS) ×3 IMPLANT
WRAP KNEE W/COLD PACKS 25.5X14 (SOFTGOODS) ×3 IMPLANT

## 2016-05-22 NOTE — Anesthesia Preprocedure Evaluation (Signed)
Anesthesia Evaluation  Patient identified by MRN, date of birth, ID band Patient awake    Reviewed: Allergy & Precautions, NPO status , Patient's Chart, lab work & pertinent test results  History of Anesthesia Complications Negative for: history of anesthetic complications  Airway Mallampati: III       Dental   Pulmonary neg pulmonary ROS, Current Smoker,           Cardiovascular hypertension, Pt. on medications      Neuro/Psych negative neurological ROS     GI/Hepatic Neg liver ROS, GERD  ,  Endo/Other  negative endocrine ROS  Renal/GU negative Renal ROS     Musculoskeletal   Abdominal   Peds  Hematology negative hematology ROS (+)   Anesthesia Other Findings   Reproductive/Obstetrics                             Anesthesia Physical Anesthesia Plan  ASA: II  Anesthesia Plan: General   Post-op Pain Management:    Induction: Intravenous  Airway Management Planned: LMA  Additional Equipment:   Intra-op Plan:   Post-operative Plan:   Informed Consent: I have reviewed the patients History and Physical, chart, labs and discussed the procedure including the risks, benefits and alternatives for the proposed anesthesia with the patient or authorized representative who has indicated his/her understanding and acceptance.     Plan Discussed with:   Anesthesia Plan Comments:         Anesthesia Quick Evaluation

## 2016-05-22 NOTE — Anesthesia Procedure Notes (Signed)
Procedure Name: Intubation Date/Time: 05/22/2016 3:30 PM Performed by: Nelda Marseille Pre-anesthesia Checklist: Patient identified, Patient being monitored, Timeout performed, Emergency Drugs available and Suction available Patient Re-evaluated:Patient Re-evaluated prior to inductionOxygen Delivery Method: Circle system utilized Preoxygenation: Pre-oxygenation with 100% oxygen Intubation Type: IV induction Ventilation: Two handed mask ventilation required and Oral airway inserted - appropriate to patient size Laryngoscope Size: Mac and 4 Grade View: Grade III Tube type: Oral Tube size: 7.0 mm Number of attempts: 2 Airway Equipment and Method: Stylet and Oral airway Placement Confirmation: ETT inserted through vocal cords under direct vision,  positive ETCO2 and breath sounds checked- equal and bilateral Secured at: 21 cm Tube secured with: Tape Dental Injury: Teeth and Oropharynx as per pre-operative assessment  Difficulty Due To: Difficult Airway- due to large tongue, Difficult Airway- due to reduced neck mobility, Difficult Airway-  due to edematous airway and Difficulty was unanticipated Comments: Placed ETT and took 3.5 LMA out.  Unable to ventilate well with LMA.  High PIP pressures.  Albuterol given.

## 2016-05-22 NOTE — Op Note (Signed)
OPERATIVE NOTE  DATE OF SURGERY:  05/22/2016  PATIENT NAME:  Joanna Morris   DOB: 02/11/1951  MRN: [id number]   PRE-OPERATIVE DIAGNOSIS:  Internal derangement of the right knee   POST-OPERATIVE DIAGNOSIS:   Tear of the posterior horn of the medial meniscus, right knee  PROCEDURE:  Right knee arthroscopy, partial medial meniscectomy  SURGEON:  Marciano Sequin., M.D.   ASSISTANT: none  ANESTHESIA: general  ESTIMATED BLOOD LOSS: Minimal  FLUIDS REPLACED: 1000 mL of crystalloid  TOURNIQUET TIME: Not used   DRAINS: none  IMPLANTS UTILIZED: None  INDICATIONS FOR SURGERY: Joanna Morris is a 66 y.o. year old female who has been seen for complaints of right knee pain. MRI demonstrated findings consistent with meniscal pathology. After discussion of the risks and benefits of surgical intervention, the patient expressed understanding of the risks benefits and agree with plans for right knee arthroscopy.   PROCEDURE IN DETAIL: The patient was brought into the operating room and, after adequate general anesthesia was achieved, a tourniquet was applied to the right thigh and the leg was placed in the leg holder. All bony prominences were well padded. The patient's right knee was cleaned and prepped with alcohol and Duraprep and draped in the usual sterile fashion. A "timeout" was performed as per usual protocol. The anticipated portal sites were injected with 0.25% Marcaine with epinephrine. An anterolateral incision was made and a cannula was inserted. A small effusion was evacuated and the knee was distended with fluid using the pump. The scope was advanced down the medial gutter into the medial compartment. Under visualization with the scope, an anteromedial portal was created and a hooked probe was inserted. The medial meniscus was visualized and probed. There was a large flap type tear of the posterior horn of the medial meniscus. The tear was debrided using combination of meniscal  punches and a 4.5 mm incisor shaver. Final contouring was performed using a 50 ArthroCare wand. The remaining rim of meniscus was visualized and probed and noted to be stable. The articular cartilage was visualized and was noted to be in good condition.  The scope was then advanced into the intercondylar notch. The anterior cruciate ligament was visualized and probed and felt to be intact. The scope was removed from the lateral portal and reinserted via the anteromedial portal to better visualize the lateral compartment. The lateral meniscus was visualized and probed. There is no evidence of tear or instability of the lateral meniscus. The articular cartilage of the lateral compartment was visualized and noted to be in good condition. Finally, the scope was advanced so as to visualize the patellofemoral articulation. Good patellar tracking was appreciated.   The knee was irrigated with copius amounts of fluid and suctioned dry. The anterolateral portal was re-approximated with #3-0 nylon. A combination of 0.25% Marcaine with epinephrine and 4 mg of Morphine were injected via the scope. The scope was removed and the anteromedial portal was re-approximated with #3-0 nylon. A sterile dressing was applied followed by application of an ice wrap.  The patient tolerated the procedure well and was transported to the PACU in stable condition.  Kaleth Koy P. Holley Bouche., M.D.

## 2016-05-22 NOTE — H&P (Signed)
The patient has been re-examined, and the chart reviewed, and there have been no interval changes to the documented history and physical.    The risks, benefits, and alternatives have been discussed at length. The patient expressed understanding of the risks benefits and agreed with plans for surgical intervention.  James P. Hooten, Jr. M.D.    

## 2016-05-22 NOTE — Anesthesia Post-op Follow-up Note (Cosign Needed)
Anesthesia QCDR form completed.        

## 2016-05-22 NOTE — Transfer of Care (Signed)
Immediate Anesthesia Transfer of Care Note  Patient: Joanna Morris  Procedure(s) Performed: Procedure(s): KNEE ARTHROSCOPY WITH PARTIAL MEDIAL MENISECTOMY (Right)  Patient Location: PACU  Anesthesia Type:General  Level of Consciousness: awake, alert  and oriented  Airway & Oxygen Therapy: Patient Spontanous Breathing and Patient connected to face mask oxygen  Post-op Assessment: Report given to RN and Post -op Vital signs reviewed and stable  Post vital signs: Reviewed and stable  Last Vitals:  Vitals:   05/22/16 1357 05/22/16 1710  BP: 135/85 (!) (P) 176/99  Pulse: 81   Resp: 18 (P) 14  Temp: 37 C (P) 36.2 C    Last Pain:  Vitals:   05/22/16 1357  TempSrc: Oral         Complications: No apparent anesthesia complications

## 2016-05-22 NOTE — Anesthesia Procedure Notes (Signed)
Procedure Name: LMA Insertion Date/Time: 05/22/2016 3:15 PM Performed by: Nelda Marseille Pre-anesthesia Checklist: Patient identified, Patient being monitored, Timeout performed, Emergency Drugs available and Suction available Patient Re-evaluated:Patient Re-evaluated prior to inductionOxygen Delivery Method: Circle system utilized Preoxygenation: Pre-oxygenation with 100% oxygen Intubation Type: IV induction Ventilation: Mask ventilation without difficulty LMA: LMA inserted LMA Size: 3.5 Tube type: Oral Number of attempts: 1 Placement Confirmation: positive ETCO2 and breath sounds checked- equal and bilateral Tube secured with: Tape Dental Injury: Teeth and Oropharynx as per pre-operative assessment

## 2016-05-23 ENCOUNTER — Encounter: Payer: Self-pay | Admitting: Orthopedic Surgery

## 2016-05-23 NOTE — Anesthesia Postprocedure Evaluation (Signed)
Anesthesia Post Note  Patient: Joanna Morris  Procedure(s) Performed: Procedure(s) (LRB): KNEE ARTHROSCOPY WITH PARTIAL MEDIAL MENISECTOMY (Right)  Patient location during evaluation: PACU Anesthesia Type: General Level of consciousness: awake and alert Pain management: pain level controlled Vital Signs Assessment: post-procedure vital signs reviewed and stable Respiratory status: spontaneous breathing and respiratory function stable Cardiovascular status: stable Anesthetic complications: no     Last Vitals:  Vitals:   05/22/16 1829 05/22/16 1853  BP: (!) 139/97   Pulse: 93 90  Resp: 15   Temp:      Last Pain:  Vitals:   05/22/16 1820  TempSrc: Temporal  PainSc:                  [name] K

## 2016-08-18 ENCOUNTER — Ambulatory Visit: Payer: Self-pay | Admitting: Family Medicine

## 2016-08-21 ENCOUNTER — Encounter: Payer: Self-pay | Admitting: Family Medicine

## 2016-08-21 ENCOUNTER — Ambulatory Visit (INDEPENDENT_AMBULATORY_CARE_PROVIDER_SITE_OTHER): Payer: Medicare Other | Admitting: Family Medicine

## 2016-08-21 ENCOUNTER — Other Ambulatory Visit: Payer: Self-pay | Admitting: Family Medicine

## 2016-08-21 VITALS — BP 138/74 | HR 86 | Temp 98.5°F | Resp 16 | Ht 62.0 in | Wt 216.4 lb

## 2016-08-21 DIAGNOSIS — R7309 Other abnormal glucose: Secondary | ICD-10-CM

## 2016-08-21 DIAGNOSIS — R7989 Other specified abnormal findings of blood chemistry: Secondary | ICD-10-CM

## 2016-08-21 DIAGNOSIS — R799 Abnormal finding of blood chemistry, unspecified: Secondary | ICD-10-CM

## 2016-08-21 DIAGNOSIS — I10 Essential (primary) hypertension: Secondary | ICD-10-CM

## 2016-08-21 DIAGNOSIS — Z716 Tobacco abuse counseling: Secondary | ICD-10-CM

## 2016-08-21 DIAGNOSIS — Z720 Tobacco use: Secondary | ICD-10-CM | POA: Diagnosis not present

## 2016-08-21 DIAGNOSIS — E669 Obesity, unspecified: Secondary | ICD-10-CM

## 2016-08-21 DIAGNOSIS — E7800 Pure hypercholesterolemia, unspecified: Secondary | ICD-10-CM

## 2016-08-21 DIAGNOSIS — Z0000 Encounter for general adult medical examination without abnormal findings: Secondary | ICD-10-CM

## 2016-08-21 MED ORDER — HYDROCHLOROTHIAZIDE 12.5 MG PO CAPS: 12.5000 mg | ORAL_CAPSULE | Freq: Every day | ORAL | 11 refills | Status: DC

## 2016-08-21 NOTE — Assessment & Plan Note (Addendum)
Not ready to quit today. However she is very interested to quit and understands health impact of smoking, she is in contemplative stages of quitting now. Considering Chantix. - Her motivations to quit include caring for her niece/nephew young family members, avoiding smoke exposure.  Discussion today >5 minutes (<10 minutes) specifically on counseling on risks of tobacco use, complications, treatment, smoking cessation.

## 2016-08-21 NOTE — Patient Instructions (Addendum)
Thank you for coming in to clinic today.  1.  Keep taking HCTZ 12.5mg  daily  As discussed, next time if your BP is still close to 140, then we will likely double up the dose and increase to HCTZ 25mg . Try to reduce smoking or quit, and stay more active and take your pill each day to help control this.  Check BP at pharmacy at least once weekly. If BP is consistently >140/90, then notify us and we can send in additional BP pill, Amlodipine 5mg  daily, to take both together  [facility] [place], [place] [address] Phone: [phone] Call anytime to schedule once we have submitted order  [facility] [address] [place], [place] [address]  Let me know if you are interested to quit smoking. Please consider Varenicline (Chantix) or Buproprion (Zyban) - you could quit smoking in 1 month on these meds.  1 800-QUIT NOW  You will be due for FASTING BLOOD WORK (no food or drink after midnight before, only water or coffee without cream/sugar on the morning of)  - Please go ahead and schedule a "Lab Only" visit in the morning at the clinic for lab draw in 4 months  before next Annual Physical  - Make sure Lab Only appointment is at least 1-2 weeks before your next appointment, so that results will be available  Please schedule a follow-up appointment with Dr. Parks Ranger in 4 months for HTN, next visit will be Annual Physical 12/2016  If you have any other questions or concerns, please feel free to call the clinic or send a message through [place]. You may also schedule an earlier appointment if necessary.  Nobie Putnam, DO [place]

## 2016-08-21 NOTE — Progress Notes (Addendum)
Subjective:    Patient ID: Joanna Morris, female    DOB: Oct 18, 1950, 66 y.o.   MRN: [id number]  Joanna Morris is a 66 y.o. female presenting on 08/21/2016 for Hypertension  HPI  CHRONIC HTN: Reports that she has been not as adherent to medication, she has been taking HCTZ 12.5mg  every other day. She checks BP at the pharmacy maybe 1x week or less, often readings are 130-140s/68-70s. In past admits problem with running out of meds before, but now this is not problem, has enough med at home. Current Meds - HCTZ 12.5mg    Reports took meds today. Tolerating well, w/o complaints. - Med helps with limiting LE edema Lifestyle: - Diet: Reducing salty foods since last visit, drinks mostly water - Exercise: Recently s/p knee surgery, she has been working on home exercises for strength. Not doing PT currently. Walking distance is reduced at the moment while healing. Denies CP, dyspnea, HA, edema, dizziness / lightheadedness  TOBACCO ABUSE: - Active smoker still continues to think about reducing amount but not ready to quit, Admits recent stress with recent knee surgery and she is watching over her nieces's children recently more incentive to quit. - Still smoking about 0.5ppd, unchanged. She may consider Chantix in future.  Health Maintenance: - Has not scheduled Mammogram or DEXA yet, since last visit ordered in 05/2016. She will call soon, had recent recovery from knee surgery.  Social History  Substance Use Topics  . Smoking status: Current Every Day Smoker    Packs/day: 0.50    Years: 25.00    Types: Cigarettes  . Smokeless tobacco: Current User     Comment: Quit once for 2 years.  . Alcohol use Yes     Comment: 1-3 drinks every other week.   Review of Systems Per HPI unless specifically indicated above      Objective:    BP 138/74 (BP Location: Left Arm, Cuff Size: Large)   Pulse 86   Temp 98.5 F (36.9 C) (Oral)   Resp 16   Ht 5\' 2"  (1.575 m)   Wt 216 lb 6.4 oz (98.2  kg)   BMI 39.58 kg/m   Wt Readings from Last 3 Encounters:  08/21/16 216 lb 6.4 oz (98.2 kg)  05/22/16 215 lb (97.5 kg)  02/04/16 215 lb 9.6 oz (97.8 kg)    Physical Exam  Constitutional: She is oriented to person, place, and time. She appears well-developed and well-nourished. No distress.  Well-appearing, comfortable, cooperative, obese  HENT:  Head: Normocephalic and atraumatic.  Mouth/Throat: Oropharynx is clear and moist.  Eyes: Conjunctivae are normal.  Neck: Normal range of motion. Neck supple.  Cardiovascular: Normal rate, regular rhythm, normal heart sounds and intact distal pulses.   No murmur heard. Pulmonary/Chest: Effort normal and breath sounds normal. No respiratory distress. She has no wheezes. She has no rales.  Musculoskeletal: She exhibits no edema.  Neurological: She is alert and oriented to person, place, and time.  Skin: Skin is warm and dry. No rash noted. She is not diaphoretic.  Psychiatric: She has a normal mood and affect. Her behavior is normal.  Nursing note and vitals reviewed.     Chemistry      Component Value Date/Time   NA 139 05/16/2016 1124   K 3.7 05/16/2016 1124   CL 100 (L) 05/16/2016 1124   CO2 33 (H) 05/16/2016 1124   BUN 16 05/16/2016 1124   CREATININE 0.68 05/16/2016 1124   CREATININE 0.86 12/23/2015 0807  Component Value Date/Time   CALCIUM 9.4 05/16/2016 1124   ALKPHOS 49 12/23/2015 0807   AST 27 12/23/2015 0807   ALT 30 (H) 12/23/2015 0807   BILITOT 0.6 12/23/2015 0807        Assessment & Plan:   Problem List Items Addressed This Visit    Tobacco abuse    Not ready to quit today. However she is very interested to quit and understands health impact of smoking, she is in contemplative stages of quitting now. Considering Chantix. - Her motivations to quit include caring for her niece/nephew young family members, avoiding smoke exposure.  Discussion today >5 minutes (<10 minutes) specifically on counseling on risks of  tobacco use, complications, treatment, smoking cessation.      Hypertension - Primary    Still mild elevated BP, overall improved but SBP on manual repeat is borderline 138. Still not adhering to therapy regularly, intermittent qod dosing - Interval pre-op chemistry normal after relatively new start thiazide No known complications  Plan: 1. Discussion that may need additional meds / higher dose - however since still non adherent will defer for now, she admits to improving adherence, wants to try longer on current dose - Continue HCTZ 12.5mg  2. Continue to check BP at pharmacy, write down readings, notify sooner if persistently elevated >140/90, likely would increase dose HCTZ to 25mg  daily first 3. Now post-op - continue to improve working on daily exercise and lifestyle, low sodium diet 4. Counseling on quitting smoking - plan to quit by 01/2017 5. Follow-up 4 months for Annual Physical and labs      Relevant Medications   hydrochlorothiazide (MICROZIDE) 12.5 MG capsule    Other Visit Diagnoses    Encounter for smoking cessation counseling          Meds ordered this encounter  Medications  . hydrochlorothiazide (MICROZIDE) 12.5 MG capsule    Sig: Take 1 capsule (12.5 mg total) by mouth daily.    Dispense:  30 capsule    Refill:  11    Follow up plan: Return in about 4 months (around 12/21/2016) for Annual Physical.  Nobie Putnam, DO [facility] 08/21/2016, 10:38 PM

## 2016-08-21 NOTE — Assessment & Plan Note (Addendum)
Still mild elevated BP, overall improved but SBP on manual repeat is borderline 138. Still not adhering to therapy regularly, intermittent qod dosing - Interval pre-op chemistry normal after relatively new start thiazide No known complications  Plan: 1. Discussion that may need additional meds / higher dose - however since still non adherent will defer for now, she admits to improving adherence, wants to try longer on current dose - Continue HCTZ 12.5mg  2. Continue to check BP at pharmacy, write down readings, notify sooner if persistently elevated >140/90, likely would increase dose HCTZ to 25mg  daily first 3. Now post-op - continue to improve working on daily exercise and lifestyle, low sodium diet 4. Counseling on quitting smoking - plan to quit by 01/2017 5. Follow-up 4 months for Annual Physical and labs

## 2016-12-05 ENCOUNTER — Ambulatory Visit (INDEPENDENT_AMBULATORY_CARE_PROVIDER_SITE_OTHER): Payer: Medicare Other

## 2016-12-05 ENCOUNTER — Other Ambulatory Visit: Payer: Self-pay

## 2016-12-05 VITALS — BP 148/82 | HR 82 | Temp 99.0°F | Resp 16 | Ht 62.0 in | Wt 216.2 lb

## 2016-12-05 DIAGNOSIS — E669 Obesity, unspecified: Secondary | ICD-10-CM

## 2016-12-05 DIAGNOSIS — I10 Essential (primary) hypertension: Secondary | ICD-10-CM

## 2016-12-05 DIAGNOSIS — R7989 Other specified abnormal findings of blood chemistry: Secondary | ICD-10-CM

## 2016-12-05 DIAGNOSIS — E7800 Pure hypercholesterolemia, unspecified: Secondary | ICD-10-CM

## 2016-12-05 DIAGNOSIS — R7309 Other abnormal glucose: Secondary | ICD-10-CM

## 2016-12-05 DIAGNOSIS — Z0000 Encounter for general adult medical examination without abnormal findings: Secondary | ICD-10-CM | POA: Diagnosis not present

## 2016-12-05 DIAGNOSIS — R799 Abnormal finding of blood chemistry, unspecified: Secondary | ICD-10-CM

## 2016-12-05 NOTE — Patient Instructions (Signed)
Joanna Morris , Thank you for taking time to come for your Medicare Wellness Visit. I appreciate your ongoing commitment to your health goals. Please review the following plan we discussed and let me know if I can assist you in the future.   Screening recommendations/referrals: Colonoscopy: completed 12/10/15 Mammogram: Please call [phone] to schedule your mammogram.  Bone Density: Please call [phone] to schedule your Dexa Scan.  Recommended yearly ophthalmology/optometry visit for glaucoma screening and checkup Recommended yearly dental visit for hygiene and checkup  Vaccinations: Influenza vaccine: declined Pneumococcal vaccine: declined Tdap vaccine: declined. Will check with insurance Shingles vaccine: declined. Will check with insurance    Advanced directives: Advance directive discussed with you today. I have provided a copy for you to complete at home and have notarized. Once this is complete please bring a copy in to our office so we can scan it into your chart.  Conditions/risks identified: Recommend gradually increasing distance walked from 1 mile to 2 miles; Fall risk prevention  Next appointment: Scheduled to complete labs on 12/05/16 @ 9:15am. Scheduled to see Dr. Parks Ranger on 12/27/16 @ 9:00am. Follow annual wellness in one year.   Preventive Care 17 Years and Older, Female Preventive care refers to lifestyle choices and visits with your health care provider that can promote health and wellness. What does preventive care include?  A yearly physical exam. This is also called an annual well check.  Dental exams once or twice a year.  Routine eye exams. Ask your health care provider how often you should have your eyes checked.  Personal lifestyle choices, including:  Daily care of your teeth and gums.  Regular physical activity.  Eating a healthy diet.  Avoiding tobacco and drug use.  Limiting alcohol use.  Practicing safe sex.  Taking low-dose  aspirin every day.  Taking vitamin and mineral supplements as recommended by your health care provider. What happens during an annual well check? The services and screenings done by your health care provider during your annual well check will depend on your age, overall health, lifestyle risk factors, and family history of disease. Counseling  Your health care provider may ask you questions about your:  Alcohol use.  Tobacco use.  Drug use.  Emotional well-being.  Home and relationship well-being.  Sexual activity.  Eating habits.  History of falls.  Memory and ability to understand (cognition).  Work and work Statistician.  Reproductive health. Screening  You may have the following tests or measurements:  Height, weight, and BMI.  Blood pressure.  Lipid and cholesterol levels. These may be checked every 5 years, or more frequently if you are over 29 years old.  Skin check.  Lung cancer screening. You may have this screening every year starting at age 25 if you have a 30-pack-year history of smoking and currently smoke or have quit within the past 15 years.  Fecal occult blood test (FOBT) of the stool. You may have this test every year starting at age 28.  Flexible sigmoidoscopy or colonoscopy. You may have a sigmoidoscopy every 5 years or a colonoscopy every 10 years starting at age 60.  Hepatitis C blood test.  Hepatitis B blood test.  Sexually transmitted disease (STD) testing.  Diabetes screening. This is done by checking your blood sugar (glucose) after you have not eaten for a while (fasting). You may have this done every 1-3 years.  Bone density scan. This is done to screen for osteoporosis. You may have this done starting at age 70.  Mammogram. This may be done every 1-2 years. Talk to your health care provider about how often you should have regular mammograms. Talk with your health care provider about your test results, treatment options, and if  necessary, the need for more tests. Vaccines  Your health care provider may recommend certain vaccines, such as:  Influenza vaccine. This is recommended every year.  Tetanus, diphtheria, and acellular pertussis (Tdap, Td) vaccine. You may need a Td booster every 10 years.  Zoster vaccine. You may need this after age 69.  Pneumococcal 13-valent conjugate (PCV13) vaccine. One dose is recommended after age 63.  Pneumococcal polysaccharide (PPSV23) vaccine. One dose is recommended after age 51. Talk to your health care provider about which screenings and vaccines you need and how often you need them. This information is not intended to replace advice given to you by your health care provider. Make sure you discuss any questions you have with your health care provider. Document Released: 03/19/2015 Document Revised: 11/10/2015 Document Reviewed: 12/22/2014 Elsevier Interactive Patient Education  2017 [employer] Prevention in the Home Falls can cause injuries. They can happen to people of all ages. There are many things you can do to make your home safe and to help prevent falls. What can I do on the outside of my home?  Regularly fix the edges of walkways and driveways and fix any cracks.  Remove anything that might make you trip as you walk through a door, such as a raised step or threshold.  Trim any bushes or trees on the path to your home.  Use bright outdoor lighting.  Clear any walking paths of anything that might make someone trip, such as rocks or tools.  Regularly check to see if handrails are loose or broken. Make sure that both sides of any steps have handrails.  Any raised decks and porches should have guardrails on the edges.  Have any leaves, snow, or ice cleared regularly.  Use sand or salt on walking paths during winter.  Clean up any spills in your garage right away. This includes oil or grease spills. What can I do in the bathroom?  Use night  lights.  Install grab bars by the toilet and in the tub and shower. Do not use towel bars as grab bars.  Use non-skid mats or decals in the tub or shower.  If you need to sit down in the shower, use a plastic, non-slip stool.  Keep the floor dry. Clean up any water that spills on the floor as soon as it happens.  Remove soap buildup in the tub or shower regularly.  Attach bath mats securely with double-sided non-slip rug tape.  Do not have throw rugs and other things on the floor that can make you trip. What can I do in the bedroom?  Use night lights.  Make sure that you have a light by your bed that is easy to reach.  Do not use any sheets or blankets that are too big for your bed. They should not hang down onto the floor.  Have a firm chair that has side arms. You can use this for support while you get dressed.  Do not have throw rugs and other things on the floor that can make you trip. What can I do in the kitchen?  Clean up any spills right away.  Avoid walking on wet floors.  Keep items that you use a lot in easy-to-reach places.  If you need to reach  something above you, use a strong step stool that has a grab bar.  Keep electrical cords out of the way.  Do not use floor polish or wax that makes floors slippery. If you must use wax, use non-skid floor wax.  Do not have throw rugs and other things on the floor that can make you trip. What can I do with my stairs?  Do not leave any items on the stairs.  Make sure that there are handrails on both sides of the stairs and use them. Fix handrails that are broken or loose. Make sure that handrails are as long as the stairways.  Check any carpeting to make sure that it is firmly attached to the stairs. Fix any carpet that is loose or worn.  Avoid having throw rugs at the top or bottom of the stairs. If you do have throw rugs, attach them to the floor with carpet tape.  Make sure that you have a light switch at the  top of the stairs and the bottom of the stairs. If you do not have them, ask someone to add them for you. What else can I do to help prevent falls?  Wear shoes that:  Do not have high heels.  Have rubber bottoms.  Are comfortable and fit you well.  Are closed at the toe. Do not wear sandals.  If you use a stepladder:  Make sure that it is fully opened. Do not climb a closed stepladder.  Make sure that both sides of the stepladder are locked into place.  Ask someone to hold it for you, if possible.  Clearly mark and make sure that you can see:  Any grab bars or handrails.  First and last steps.  Where the edge of each step is.  Use tools that help you move around (mobility aids) if they are needed. These include:  Canes.  Walkers.  Scooters.  Crutches.  Turn on the lights when you go into a dark area. Replace any light bulbs as soon as they burn out.  Set up your furniture so you have a clear path. Avoid moving your furniture around.  If any of your floors are uneven, fix them.  If there are any pets around you, be aware of where they are.  Review your medicines with your doctor. Some medicines can make you feel dizzy. This can increase your chance of falling. Ask your doctor what other things that you can do to help prevent falls. This information is not intended to replace advice given to you by your health care provider. Make sure you discuss any questions you have with your health care provider. Document Released: 12/17/2008 Document Revised: 07/29/2015 Document Reviewed: 03/27/2014 Elsevier Interactive Patient Education  2017 [employer].

## 2016-12-05 NOTE — Progress Notes (Signed)
Subjective:   Joanna Morris is a 66 y.o. female who presents for an Initial Medicare Annual Wellness Visit.  Review of Systems: N/A      Cardiac Risk Factors include: advanced age (>77men, >71 women);dyslipidemia;hypertension;obesity (BMI >30kg/m2);smoking/ tobacco exposure     Objective:    Today's Vitals   12/05/16 0915  BP: (!) 148/82  Pulse: 82  Resp: 16  Temp: 99 F (37.2 C)  TempSrc: Oral  Weight: 216 lb 3.2 oz (98.1 kg)  Height: 5\' 2"  (1.575 m)   Body mass index is 39.54 kg/m.   Current Medications (verified) Outpatient Encounter Prescriptions as of 12/05/2016  Medication Sig  . hydrochlorothiazide (MICROZIDE) 12.5 MG capsule Take 1 capsule (12.5 mg total) by mouth daily.  Marland Kitchen ketotifen (ZADITOR) 0.025 % ophthalmic solution Place 1-2 drops into both eyes 2 (two) times daily as needed (for itchy eyes).   No facility-administered encounter medications on file as of 12/05/2016.     Allergies (verified) Tramadol   History: Past Medical History:  Diagnosis Date  . Cataract   . Genital warts   . GERD (gastroesophageal reflux disease)    H/O  . History of kidney stones   . Hypertension    Past Surgical History:  Procedure Laterality Date  . ABDOMINAL HYSTERECTOMY    . CESAREAN SECTION     X2  . KNEE ARTHROSCOPY WITH MEDIAL MENISECTOMY Right 05/22/2016   Procedure: KNEE ARTHROSCOPY WITH PARTIAL MEDIAL MENISECTOMY;  Surgeon: Dereck Leep, MD;  Location: [facility] ORS;  Service: Orthopedics;  Laterality: Right;   Family History  Problem Relation Age of Onset  . Heart disease Mother 77       MI  . Heart attack Mother 37  . Diabetes Mother   . Pancreatic cancer Sister    Social History   Occupational History  . Retired    Social History Main Topics  . Smoking status: Current Every Day Smoker    Packs/day: 0.50    Years: 25.00    Types: Cigarettes  . Smokeless tobacco: Current User     Comment: Quit once for 2 years.  . Alcohol use Yes   Comment: 1-3 drinks every other week.  . Drug use: No  . Sexual activity: Not on file    Tobacco Counseling Ready to quit: Not Answered Counseling given: Not Answered   Activities of Daily Living In your present state of health, do you have any difficulty performing the following activities: 12/05/2016 05/15/2016  Hearing? N N  Vision? N N  Difficulty concentrating or making decisions? N N  Walking or climbing stairs? Y Y  Comment s/p R knee repair March of this year. Still having issues with ambulation -  Dressing or bathing? N N  Doing errands, shopping? N N  Preparing Food and eating ? N -  Using the Toilet? N -  In the past six months, have you accidently leaked urine? N -  Do you have problems with loss of bowel control? N -  Managing your Medications? N -  Managing your Finances? N -  Housekeeping or managing your Housekeeping? N -  Some recent data might be hidden    Immunizations and Health Maintenance  There is no immunization history on file for this patient. There are no preventive care reminders to display for this patient.  Patient Care Team: Olin Hauser, DO as PCP - General (Family Medicine) Marry Guan, Laurice Record, MD as Consulting Physician (Orthopedic Surgery)  Indicate any recent Medical  Services you may have received from other than [facility] providers in the past year (date may be approximate).     Assessment:   This is a routine wellness examination for Joanna Morris.   Hearing/Vision screen Vision Screening Comments: Does not complete annual eye exams. Does not have an eye doctor  Dietary issues and exercise activities discussed: Current Exercise Habits: The patient does not participate in regular exercise at present (Does walk. Has been gradually increasing distance since knee repair ), Exercise limited by: orthopedic condition(s) (s/p R knee repair May 22, 2016)  Goals    . Exercise 150 minutes per week (moderate activity)          Recommend  gradually increasing distance walked from 1 mile to 2 miles      Depression Screen PHQ 2/9 Scores 12/05/2016 12/10/2015  PHQ - 2 Score 0 0    Fall Risk Fall Risk  12/05/2016 12/10/2015  Falls in the past year? Yes No  Number falls in past yr: 1 -  Injury with Fall? No -  Risk for fall due to : Impaired balance/gait;Medication side effect -  Risk for fall due to: Comment s/p R knee repair 05/2016. Still having mobility issues with gait -  Follow up Education provided;Falls prevention discussed -    Cognitive Function:     6CIT Screen 12/05/2016  What Year? 0 points  What month? 0 points  What time? 0 points  Count back from 20 0 points  Months in reverse 0 points  Repeat phrase 2 points  Total Score 2    Screening Tests Health Maintenance  Topic Date Due  . DEXA SCAN  12/09/2016 (Originally 05/16/2013)  . MAMMOGRAM  01/05/2017 (Originally 05/17/1998)  . INFLUENZA VACCINE  12/04/2017 (Originally 10/04/2016)  . PNA vac Low Risk Adult (1 of 2 - PCV13) 12/04/2017 (Originally 05/16/2013)  . TETANUS/TDAP  12/09/2025 (Originally 05/17/1967)  . COLONOSCOPY  12/09/2025  . Hepatitis C Screening  Completed      Plan:   I have personally reviewed and addressed the Medicare Annual Wellness questionnaire and have noted the following in the patient's chart:  A. Medical and social history B. Use of alcohol, tobacco or illicit drugs  C. Current medications and supplements D. Functional ability and status E.  Nutritional status F.  Physical activity G. Advance directives H. List of other physicians I.  Hospitalizations, surgeries, and ER visits in previous 12 months J.  [place] such as hearing and vision if needed, cognitive and depression L. Referrals and appointments - none  In addition, I have reviewed and discussed with patient certain preventive protocols, quality metrics, and best practice recommendations. A written personalized care plan for preventive services as  well as general preventive health recommendations were provided to patient.  See attached scanned questionnaire for additional information.   Signed,  Aleatha Borer, LPN Nurse Health Advisor   MD Recommendations: None

## 2016-12-06 ENCOUNTER — Encounter: Payer: Self-pay | Admitting: Family Medicine

## 2016-12-06 DIAGNOSIS — R7309 Other abnormal glucose: Secondary | ICD-10-CM | POA: Insufficient documentation

## 2016-12-06 LAB — CBC WITH DIFFERENTIAL/PLATELET
BASOS ABS: 31 {cells}/uL (ref 0–200)
Basophils Relative: 0.7 %
EOS PCT: 1.6 %
Eosinophils Absolute: 70 cells/uL (ref 15–500)
HEMATOCRIT: 45.3 % — AB (ref 35.0–45.0)
HEMOGLOBIN: 14.9 g/dL (ref 11.7–15.5)
LYMPHS ABS: 2284 {cells}/uL (ref 850–3900)
MCH: 28.4 pg (ref 27.0–33.0)
MCHC: 32.9 g/dL (ref 32.0–36.0)
MCV: 86.5 fL (ref 80.0–100.0)
MPV: 11.9 fL (ref 7.5–12.5)
Monocytes Relative: 8.2 %
NEUTROS ABS: 1654 {cells}/uL (ref 1500–7800)
Neutrophils Relative %: 37.6 %
Platelets: 175 10*3/uL (ref 140–400)
RBC: 5.24 10*6/uL — ABNORMAL HIGH (ref 3.80–5.10)
RDW: 12.8 % (ref 11.0–15.0)
Total Lymphocyte: 51.9 %
WBC: 4.4 10*3/uL (ref 3.8–10.8)
WBCMIX: 361 {cells}/uL (ref 200–950)

## 2016-12-06 LAB — COMPLETE METABOLIC PANEL WITH GFR
AG RATIO: 1.2 (calc) (ref 1.0–2.5)
ALKALINE PHOSPHATASE (APISO): 42 U/L (ref 33–130)
ALT: 30 U/L — AB (ref 6–29)
AST: 30 U/L (ref 10–35)
Albumin: 3.6 g/dL (ref 3.6–5.1)
BILIRUBIN TOTAL: 0.6 mg/dL (ref 0.2–1.2)
BUN: 14 mg/dL (ref 7–25)
CALCIUM: 9.1 mg/dL (ref 8.6–10.4)
CHLORIDE: 102 mmol/L (ref 98–110)
CO2: 31 mmol/L (ref 20–32)
Creat: 0.83 mg/dL (ref 0.50–0.99)
GFR, EST NON AFRICAN AMERICAN: 72 mL/min/{1.73_m2} (ref >=60)
GFR, Est African American: 84 mL/min/{1.73_m2} (ref >=60)
GLOBULIN: 3 g/dL (ref 1.9–3.7)
Glucose, Bld: 101 mg/dL — ABNORMAL HIGH (ref 65–99)
POTASSIUM: 3.6 mmol/L (ref 3.5–5.3)
SODIUM: 142 mmol/L (ref 135–146)
Total Protein: 6.6 g/dL (ref 6.1–8.1)

## 2016-12-06 LAB — LIPID PANEL
Cholesterol: 191 mg/dL (ref <200)
HDL: 57 mg/dL (ref >50)
LDL Cholesterol (Calc): 112 mg/dL (calc) — ABNORMAL HIGH
NON-HDL CHOLESTEROL (CALC): 134 mg/dL — AB (ref <130)
TRIGLYCERIDES: 117 mg/dL (ref <150)
Total CHOL/HDL Ratio: 3.4 (calc) (ref <5.0)

## 2016-12-06 LAB — HEMOGLOBIN A1C
HEMOGLOBIN A1C: 5.7 %{Hb} — AB (ref <5.7)
Mean Plasma Glucose: 117 (calc)
eAG (mmol/L): 6.5 (calc)

## 2016-12-19 ENCOUNTER — Other Ambulatory Visit: Payer: Self-pay

## 2016-12-27 ENCOUNTER — Encounter: Payer: Self-pay | Admitting: Family Medicine

## 2016-12-27 ENCOUNTER — Other Ambulatory Visit: Payer: Self-pay | Admitting: Family Medicine

## 2016-12-27 ENCOUNTER — Ambulatory Visit (INDEPENDENT_AMBULATORY_CARE_PROVIDER_SITE_OTHER): Payer: Medicare Other | Admitting: Family Medicine

## 2016-12-27 VITALS — BP 127/78 | HR 74 | Temp 98.4°F | Resp 16 | Ht 62.0 in | Wt 210.0 lb

## 2016-12-27 DIAGNOSIS — Z23 Encounter for immunization: Secondary | ICD-10-CM | POA: Diagnosis not present

## 2016-12-27 DIAGNOSIS — R7309 Other abnormal glucose: Secondary | ICD-10-CM

## 2016-12-27 DIAGNOSIS — E7800 Pure hypercholesterolemia, unspecified: Secondary | ICD-10-CM

## 2016-12-27 DIAGNOSIS — Z720 Tobacco use: Secondary | ICD-10-CM

## 2016-12-27 DIAGNOSIS — D2310 Other benign neoplasm of skin of unspecified eyelid, including canthus: Secondary | ICD-10-CM

## 2016-12-27 DIAGNOSIS — J309 Allergic rhinitis, unspecified: Secondary | ICD-10-CM | POA: Insufficient documentation

## 2016-12-27 DIAGNOSIS — I10 Essential (primary) hypertension: Secondary | ICD-10-CM

## 2016-12-27 DIAGNOSIS — M25561 Pain in right knee: Secondary | ICD-10-CM

## 2016-12-27 DIAGNOSIS — Z79899 Other long term (current) drug therapy: Secondary | ICD-10-CM

## 2016-12-27 DIAGNOSIS — J0110 Acute frontal sinusitis, unspecified: Secondary | ICD-10-CM

## 2016-12-27 DIAGNOSIS — H6983 Other specified disorders of Eustachian tube, bilateral: Secondary | ICD-10-CM

## 2016-12-27 DIAGNOSIS — J3089 Other allergic rhinitis: Secondary | ICD-10-CM

## 2016-12-27 MED ORDER — LORATADINE 10 MG PO TABS: 10.0000 mg | ORAL_TABLET | Freq: Every day | ORAL | 11 refills | Status: DC

## 2016-12-27 MED ORDER — FLUTICASONE PROPIONATE 50 MCG/ACT NA SUSP: 2.0000 | Freq: Every day | NASAL | 3 refills | Status: DC

## 2016-12-27 MED ORDER — ASPIRIN EC 81 MG PO TBEC: 81.0000 mg | DELAYED_RELEASE_TABLET | Freq: Every day | ORAL | Status: AC

## 2016-12-27 MED ORDER — ROSUVASTATIN CALCIUM 5 MG PO TABS: 5.0000 mg | ORAL_TABLET | Freq: Every day | ORAL | 5 refills | Status: DC

## 2016-12-27 MED ORDER — AMOXICILLIN 500 MG PO CAPS: 500.0000 mg | ORAL_CAPSULE | Freq: Two times a day (BID) | ORAL | 0 refills | Status: DC

## 2016-12-27 NOTE — Assessment & Plan Note (Signed)
Not ready to quit today. She is still in contemplative stages of quitting now. Considering Chantix. - Her motivations to quit still remain with caring for her niece/nephew young family members, avoiding smoke exposure.

## 2016-12-27 NOTE — Progress Notes (Signed)
Subjective:    Patient ID: Joanna Morris, female    DOB: February 06, 1951, 66 y.o.   MRN: [id number]  Joanna Morris is a 66 y.o. female presenting on 12/27/2016 for Annual Exam   HPI   Here for Annual Physical and Lab Review.  CHRONIC HTN: No new concerns. Improved adherence. Not checking BP regularly outside office Current Meds - HCTZ 12.5mg    Reports took meds today. Tolerating well, w/o complaints. Lifestyle: - Diet: Reducing salty foods since last visit, drinks mostly water  ELEVATED A1c / MORBID OBESITY (BMI >38 with comorbid HTN, HLD) Reports no concerns, this is first result with A1c >5.7 Has family history mother DM Lifestyle: - Weight loss down 6 lbs in 1-2 months - Diet (Admits eats a lot of pasta and carbs, not limiting, but she tries to reduce sugars, drinks a lot of kool-aid)  - Exercise (Improving now with walking, s/p knee surgery few months ago)  HYPERLIPIDEMIA: - Reports no concerns. Last lipid panel 12/2016, mostly controlled  - Never on statin cholesterol medication - Not taking ASA, had some intolerance to regular aspirin in past, not EC  TOBACCO ABUSE: - Active smoker, has not reduced or quit yet. Still consider due to importance of watching over her nieces's children recently more incentive to quit. - Still smoking about 0.5ppd, unchanged. She may consider Chantix in future, again not ready at this time  Chronic R knee pain, s/p R arthroscopic partial medial meniscectomy - Last seen by [facility] Dr Marry Guan on 08/10/16, reportedly doing well and improving exercise, was told expected still long duration of recovery to get back to normal. - Today reports pain continues to improve, still only mild R lower medial aspect with discomfort at times, much improved still, says may be worse with colder weather - No change to medication  SINUSITIS, Ear Pressure - Reports new complaint over past 1 week with increasing sinus pressure and congestion, also ear pain and  fullness, frontal sinus pain, states post nasal drip thicker continues to worsen. Not tried any OTC medications. - No recent antibiotics - Denies any fever/chills, sick contacts, cough, nausea vomiting  Health Maintenance: - Due for Flu Shot, will receive today - Already scheduled for Mammogram and DEXA on 01/08/17 - Declines Pneumonia vaccine today  Depression screen [facility] 2/9 12/27/2016 12/05/2016 12/10/2015  Decreased Interest 0 0 0  Down, Depressed, Hopeless 0 0 0  PHQ - 2 Score 0 0 0    Past Medical History:  Diagnosis Date  . Cataract   . Genital warts   . GERD (gastroesophageal reflux disease)    H/O  . History of kidney stones   . Hypertension    Past Surgical History:  Procedure Laterality Date  . ABDOMINAL HYSTERECTOMY    . CESAREAN SECTION     X2  . KNEE ARTHROSCOPY WITH MEDIAL MENISECTOMY Right 05/22/2016   Procedure: KNEE ARTHROSCOPY WITH PARTIAL MEDIAL MENISECTOMY;  Surgeon: Dereck Leep, MD;  Location: [facility] ORS;  Service: Orthopedics;  Laterality: Right;   Social History   Social History  . Marital status: Divorced    Spouse name: N/A  . Number of children: N/A  . Years of education: High School   Occupational History  . Retired    Social History Main Topics  . Smoking status: Current Every Day Smoker    Packs/day: 0.50    Years: 25.00    Types: Cigarettes  . Smokeless tobacco: Current User     Comment: Quit  once for 2 years.  . Alcohol use Yes     Comment: 1-3 drinks every other week.  . Drug use: No  . Sexual activity: Not on file   Other Topics Concern  . Not on file   Social History Narrative  . No narrative on file   Family History  Problem Relation Age of Onset  . Heart disease Mother 75       MI  . Heart attack Mother 49  . Diabetes Mother   . Pancreatic cancer Sister    Current Outpatient Prescriptions on File Prior to Visit  Medication Sig  . hydrochlorothiazide (MICROZIDE) 12.5 MG capsule Take 1 capsule (12.5 mg total) by  mouth daily.  Marland Kitchen ketotifen (ZADITOR) 0.025 % ophthalmic solution Place 1-2 drops into both eyes 2 (two) times daily as needed (for itchy eyes).   No current facility-administered medications on file prior to visit.     Review of Systems  Constitutional: Negative for activity change, appetite change, chills, diaphoresis, fatigue and fever.  HENT: Positive for congestion, ear pain, sinus pain and sinus pressure. Negative for hearing loss and sore throat.   Eyes: Negative for discharge and visual disturbance.  Respiratory: Negative for apnea, cough, choking, chest tightness, shortness of breath and wheezing.   Cardiovascular: Negative for chest pain, palpitations and leg swelling.  Gastrointestinal: Negative for abdominal pain, anal bleeding, blood in stool, constipation, diarrhea, nausea and vomiting.  Endocrine: Negative for cold intolerance and polyuria.  Genitourinary: Negative for dysuria, frequency and hematuria.  Musculoskeletal: Negative for arthralgias and neck pain.  Skin: Negative for rash.  Neurological: Negative for dizziness, weakness, light-headedness, numbness and headaches.  Hematological: Negative for adenopathy.  Psychiatric/Behavioral: Negative for behavioral problems, dysphoric mood and sleep disturbance.   Per HPI unless specifically indicated above     Objective:    BP 127/78   Pulse 74   Temp 98.4 F (36.9 C) (Oral)   Resp 16   Ht 5\' 2"  (1.575 m)   Wt 210 lb (95.3 kg)   BMI 38.41 kg/m   Wt Readings from Last 3 Encounters:  12/27/16 210 lb (95.3 kg)  12/05/16 216 lb 3.2 oz (98.1 kg)  08/21/16 216 lb 6.4 oz (98.2 kg)    Physical Exam  Constitutional: She is oriented to person, place, and time. She appears well-developed and well-nourished. No distress.  Well-appearing, comfortable, cooperative, obese with some weight loss  HENT:  Head: Normocephalic and atraumatic.  Mouth/Throat: Oropharynx is clear and moist.  Frontal sinuses mild tender. Nares with  some turbinate edema without congestion or purulence . Bilateral TMs symmetrical with opaque effusion and fullness to slight bulging with mild erythema.  Oropharynx clear without erythema, exudates, edema or asymmetry.  Poor dentition  Eyes: Pupils are equal, round, and reactive to light. Conjunctivae and EOM are normal. Right eye exhibits no discharge. Left eye exhibits no discharge.  Right lateral upper eyelid with large soft mobile cyst-like appendage vs skin tag, non inflamed, non tender  Neck: Normal range of motion. Neck supple. No thyromegaly present.  No carotid bruits  Cardiovascular: Normal rate, regular rhythm, normal heart sounds and intact distal pulses.   No murmur heard. Pulmonary/Chest: Effort normal and breath sounds normal. No respiratory distress. She has no wheezes. She has no rales.  Abdominal: Soft. Bowel sounds are normal. She exhibits no distension and no mass. There is no tenderness.  Musculoskeletal: Normal range of motion. She exhibits no edema or tenderness.  Upper / Lower Extremities: -  Normal muscle tone, strength bilateral upper extremities 5/5, lower extremities 5/5  Right Knee, normal appearance, no effusion or deformity. Non reproducible localized lower medial aspect with discomfort at times. No abnormality on exam.  Lymphadenopathy:    She has no cervical adenopathy.  Neurological: She is alert and oriented to person, place, and time.  Distal sensation intact to light touch all extremities  Skin: Skin is warm and dry. No rash noted. She is not diaphoretic. No erythema.  Psychiatric: She has a normal mood and affect. Her behavior is normal.  Well groomed, good eye contact, normal speech and thoughts  Nursing note and vitals reviewed.  Results for orders placed or performed in visit on 12/05/16  COMPLETE METABOLIC PANEL WITH GFR  Result Value Ref Range   Glucose, Bld 101 (H) 65 - 99 mg/dL   BUN 14 7 - 25 mg/dL   Creat 0.83 0.50 - 0.99 mg/dL   GFR, Est  Non African American 72 > OR = 60 mL/min/1.49m2   GFR, Est African American 84 > OR = 60 mL/min/1.54m2   BUN/Creatinine Ratio NOT APPLICABLE 6 - 22 (calc)   Sodium 142 135 - 146 mmol/L   Potassium 3.6 3.5 - 5.3 mmol/L   Chloride 102 98 - 110 mmol/L   CO2 31 20 - 32 mmol/L   Calcium 9.1 8.6 - 10.4 mg/dL   Total Protein 6.6 6.1 - 8.1 g/dL   Albumin 3.6 3.6 - 5.1 g/dL   Globulin 3.0 1.9 - 3.7 g/dL (calc)   AG Ratio 1.2 1.0 - 2.5 (calc)   Total Bilirubin 0.6 0.2 - 1.2 mg/dL   Alkaline phosphatase (APISO) 42 33 - 130 U/L   AST 30 10 - 35 U/L   ALT 30 (H) 6 - 29 U/L  Lipid panel  Result Value Ref Range   Cholesterol 191 <200 mg/dL   HDL 57 >50 mg/dL   Triglycerides 117 <150 mg/dL   LDL Cholesterol (Calc) 112 (H) mg/dL (calc)   Total CHOL/HDL Ratio 3.4 <5.0 (calc)   Non-HDL Cholesterol (Calc) 134 (H) <130 mg/dL (calc)  Hemoglobin A1c  Result Value Ref Range   Hgb A1c MFr Bld 5.7 (H) <5.7 % of total Hgb   Mean Plasma Glucose 117 (calc)   eAG (mmol/L) 6.5 (calc)  CBC with Differential/Platelet  Result Value Ref Range   WBC 4.4 3.8 - 10.8 Thousand/uL   RBC 5.24 (H) 3.80 - 5.10 Million/uL   Hemoglobin 14.9 11.7 - 15.5 g/dL   HCT 45.3 (H) 35.0 - 45.0 %   MCV 86.5 80.0 - 100.0 fL   MCH 28.4 27.0 - 33.0 pg   MCHC 32.9 32.0 - 36.0 g/dL   RDW 12.8 11.0 - 15.0 %   Platelets 175 140 - 400 Thousand/uL   MPV 11.9 7.5 - 12.5 fL   Neutro Abs 1,654 1,500 - 7,800 cells/uL   Lymphs Abs 2,284 850 - 3,900 cells/uL   WBC mixed population 361 200 - 950 cells/uL   Eosinophils Absolute 70 15 - 500 cells/uL   Basophils Absolute 31 0 - 200 cells/uL   Neutrophils Relative % 37.6 %   Total Lymphocyte 51.9 %   Monocytes Relative 8.2 %   Eosinophils Relative 1.6 %   Basophils Relative 0.7 %      Assessment & Plan:   Problem List Items Addressed This Visit    Allergic rhinitis    Consistent with acute frontal rhinosinusitis, likely initially viral URI vs allergic rhinitis component with worsening  concern for bacterial infection with now pain and pressure bilateral TMs questionable with opaque effusion and some erythema.  Plan: 1. With recent change agree to provide antibiotic coverage with Amoxicillin 500mg  BID x 10 days 2. Start Loratadine (Claritin) 10mg  daily and Flonase 2 sprays in each nostril daily for next 4-6 weeks, then may stop and use seasonally or as needed 3. Supportive care with nasal saline OTC, hydration 4. Return criteria reviewed       Relevant Medications   fluticasone (FLONASE) 50 MCG/ACT nasal spray   loratadine (CLARITIN) 10 MG tablet   Elevated hemoglobin A1c    Elevated A1c with recent labs 5.7, no prior readings 5.7, not considered new dx PreDM yet Now improving lifestyle exercise, but was not adhering to diet Concern with obesity, HTN, HLD  Plan:  1. Not indicated for medication 2. Encourage improved lifestyle - handout given for low carb, low sugar diet, reduce portion size, continue improving regular exercise 3. Follow-up 6 months for labs including A1c trend if >5.7 then new dx Pre-DM      Elevated LDL cholesterol level    Mildly elevated LDL on last lipid otherwise normal lipids. Improved on lifestyle Last lipid panel 12/2016 Calculated ASCVD 10 yr risk score elevated at 22.5% (large risk factor with smoking) Never on statin before  Plan: 1. Discussion on ASCVD risk reduction strategies medicines - Start Rosuvastatin 5mg  nightly for LDL and ASCVD risk reduction, counseled on potential side effects benefits and dosing titration if myalgias reduce dose every other day - Start ASA EC 81mg  daily for primary ASCVD risk reduction 3. Encourage improved lifestyle - low carb/cholesterol, reduce portion size, continue improving regular exercise 4. Follow-up 6 months - repeat fasting lipids to check progress on statin      Relevant Medications   rosuvastatin (CRESTOR) 5 MG tablet   Hypertension - Primary    Well-controlled HTN, significant  improvement likely now with less pain after R knee surgery months ago and better med adherence - Home BP readings none available  No known complications  Plan:  1. Continue current BP regimen - HCTZ 12.5mg  2. Encourage improved lifestyle - low sodium diet, regular exercise 3. Start monitor BP outside office, bring readings to next visit, if persistently >140/90 or new symptoms notify office sooner 4. Follow-up 6 months labs      Relevant Medications   rosuvastatin (CRESTOR) 5 MG tablet   aspirin EC 81 MG tablet   Morbid obesity (HCC)    Clinically considered morbid obesity with BMI >38, with 4 comorbid conditions HLD, HTN, GERD, OA Improved weight loss down 6 lbs in 1-2 months with improved regular exercise now after knee arthroscopic meniscus procedure  Encouraged continue improve diet and exercise lifestyle towards goal weight Follow-up 6 months weight check, labs as above      Right medial knee pain    Improved R knee medial pain, now s/p R arthroscopic and partial meniscotomy Followed by [facility] with lifestyle improvement regular exercise walking rehab program as tolerated      Tobacco abuse    Not ready to quit today. She is still in contemplative stages of quitting now. Considering Chantix. - Her motivations to quit still remain with caring for her niece/nephew young family members, avoiding smoke exposure.       Other Visit Diagnoses    Dermoid cyst of right eyelid     Benign appearing soft large sized cyst vs skin tag of R lateral upper eyelid Requesting  excision and treatment, as it is interfering with her eyelid function Referral to [facility], called them to confirm this problem could be addressed and would not need dermatology instead, they reassured me and proceed with referral    Relevant Orders   Ambulatory referral to Ophthalmology   Needs flu shot       Relevant Orders   Flu vaccine HIGH DOSE PF (Completed)   Dysfunction of Eustachian  tube, bilateral       Acute non-recurrent frontal sinusitis      Consistent with acute frontal rhinosinusitis, likely initially viral URI vs allergic rhinitis component with worsening concern for bacterial infection with now pain and pressure bilateral TMs questionable with opaque effusion and some erythema.  Plan: 1. With recent change agree to provide antibiotic coverage with Amoxicillin 500mg  BID x 10 days 2. Start Loratadine (Claritin) 10mg  daily and Flonase 2 sprays in each nostril daily for next 4-6 weeks, then may stop and use seasonally or as needed 3. Supportive care with nasal saline OTC, hydration 4. Return criteria reviewed   Relevant Medications   amoxicillin (AMOXIL) 500 MG capsule   fluticasone (FLONASE) 50 MCG/ACT nasal spray   loratadine (CLARITIN) 10 MG tablet      Meds ordered this encounter  Medications  . rosuvastatin (CRESTOR) 5 MG tablet    Sig: Take 1 tablet (5 mg total) by mouth at bedtime.    Dispense:  30 tablet    Refill:  5  . aspirin EC 81 MG tablet    Sig: Take 1 tablet (81 mg total) by mouth daily.  Marland Kitchen amoxicillin (AMOXIL) 500 MG capsule    Sig: Take 1 capsule (500 mg total) by mouth 2 (two) times daily. For 10 days    Dispense:  20 capsule    Refill:  0  . fluticasone (FLONASE) 50 MCG/ACT nasal spray    Sig: Place 2 sprays into both nostrils daily. Use for 4-6 weeks then stop and use seasonally or as needed.    Dispense:  16 g    Refill:  3  . loratadine (CLARITIN) 10 MG tablet    Sig: Take 1 tablet (10 mg total) by mouth daily. Use for 4-6 weeks then stop, and use as needed or seasonally    Dispense:  30 tablet    Refill:  11   Follow up plan: Return in about 6 months (around 06/27/2017) for Elevated A1c, HLD (on statin), Smoking.  Nobie Putnam, [facility] 12/27/2016, 12:55 PM

## 2016-12-27 NOTE — Assessment & Plan Note (Signed)
Elevated A1c with recent labs 5.7, no prior readings 5.7, not considered new dx PreDM yet Now improving lifestyle exercise, but was not adhering to diet Concern with obesity, HTN, HLD  Plan:  1. Not indicated for medication 2. Encourage improved lifestyle - handout given for low carb, low sugar diet, reduce portion size, continue improving regular exercise 3. Follow-up 6 months for labs including A1c trend if >5.7 then new dx Pre-DM

## 2016-12-27 NOTE — Assessment & Plan Note (Signed)
Well-controlled HTN, significant improvement likely now with less pain after R knee surgery months ago and better med adherence - Home BP readings none available  No known complications  Plan:  1. Continue current BP regimen - HCTZ 12.5mg  2. Encourage improved lifestyle - low sodium diet, regular exercise 3. Start monitor BP outside office, bring readings to next visit, if persistently >140/90 or new symptoms notify office sooner 4. Follow-up 6 months labs

## 2016-12-27 NOTE — Assessment & Plan Note (Signed)
Mildly elevated LDL on last lipid otherwise normal lipids. Improved on lifestyle Last lipid panel 12/2016 Calculated ASCVD 10 yr risk score elevated at 22.5% (large risk factor with smoking) Never on statin before  Plan: 1. Discussion on ASCVD risk reduction strategies medicines - Start Rosuvastatin 5mg  nightly for LDL and ASCVD risk reduction, counseled on potential side effects benefits and dosing titration if myalgias reduce dose every other day - Start ASA EC 81mg  daily for primary ASCVD risk reduction 3. Encourage improved lifestyle - low carb/cholesterol, reduce portion size, continue improving regular exercise 4. Follow-up 6 months - repeat fasting lipids to check progress on statin

## 2016-12-27 NOTE — Assessment & Plan Note (Signed)
Improved R knee medial pain, now s/p R arthroscopic and partial meniscotomy Followed by [facility] with lifestyle improvement regular exercise walking rehab program as tolerated

## 2016-12-27 NOTE — Assessment & Plan Note (Signed)
Consistent with acute frontal rhinosinusitis, likely initially viral URI vs allergic rhinitis component with worsening concern for bacterial infection with now pain and pressure bilateral TMs questionable with opaque effusion and some erythema.  Plan: 1. With recent change agree to provide antibiotic coverage with Amoxicillin 500mg  BID x 10 days 2. Start Loratadine (Claritin) 10mg  daily and Flonase 2 sprays in each nostril daily for next 4-6 weeks, then may stop and use seasonally or as needed 3. Supportive care with nasal saline OTC, hydration 4. Return criteria reviewed

## 2016-12-27 NOTE — Patient Instructions (Addendum)
Thank you for coming to the clinic today.  1.  For sinus take Amoxicillin as prescribed Continue Flonase 2 sprays each nostril once daily for several weeks Take Loratadine 10mg  daily generic claritin ---  Right eyelid cyst  Referral placed to [facility] - if you don't hear back go ahead and call them next week  We will also check with them to confirm that they can do this procedure, if NOT then we will switch your referral to Dermatology  [facility] Address: [address] [place], [place] [address]  Phone: [phone]  Website: [email or web address]  ------ IF Needed here is the dermatology information we will let you know if switching referral  [facility]   [place], [place] [address] Hours: 8AM-5PM Phone: [phone]  2. A1c mildly elevated 5.7  Diet Recommendations for Preventing Diabetes   Reduce Starchy (carb) foods include: Bread, rice, pasta, potatoes, corn, crackers, bagels, muffins, all baked goods.   Protein foods include: Meat, fish, poultry, eggs, dairy foods, and beans such as pinto and kidney beans (beans also provide carbohydrate).   1. Eat at least 3 meals and 1-2 snacks per day. Never go more than 4-5 hours while awake without eating.   2. Limit starchy foods to TWO per meal and ONE per snack. ONE portion of a starchy  food is equal to the following:   - ONE slice of bread (or its equivalent, such as half of a hamburger bun).   - 1/2 cup of a "scoopable" starchy food such as potatoes or rice.   - 1 OUNCE (28 grams) of starchy snacks (crackers or pretzels, look on label).   - 15 grams of carbohydrate as shown on food label.   3. Both lunch and dinner should include a protein food, a carb food, and vegetables.   - Obtain twice as many veg's as protein or carbohydrate foods for both lunch and dinner.   - Try to keep frozen veg's on hand for a quick vegetable serving.     - Fresh or frozen veg's are best.   4.  Breakfast should always include protein. -----------------------------------------------------------------------  DUE for FASTING BLOOD WORK (no food or drink after midnight before the lab appointment, only water or coffee without cream/sugar on the morning of)  SCHEDULE "Lab Only" visit in the morning at the clinic for lab draw in 6 MONTHS   - Make sure Lab Only appointment is at about 1 week before your next appointment, so that results will be available  For Lab Results, once available within 2-3 days of blood draw, you can can log in to MyChart online to view your results and a brief explanation. Also, we can discuss results at next follow-up visit.  Please schedule a Follow-up Appointment to: Return in about 6 months (around 06/27/2017) for Elevated A1c, HLD (on statin), Smoking.  If you have any other questions or concerns, please feel free to call the clinic or send a message through [place]. You may also schedule an earlier appointment if necessary.  Additionally, you may be receiving a survey about your experience at our clinic within a few days to 1 week by e-mail or mail. We value your feedback.  Nobie Putnam, DO [place]

## 2016-12-27 NOTE — Assessment & Plan Note (Addendum)
Clinically considered morbid obesity with BMI >38, with 4 comorbid conditions HLD, HTN, GERD, OA Improved weight loss down 6 lbs in 1-2 months with improved regular exercise now after knee arthroscopic meniscus procedure  Encouraged continue improve diet and exercise lifestyle towards goal weight Follow-up 6 months weight check, labs as above

## 2017-01-08 ENCOUNTER — Encounter: Payer: Self-pay | Admitting: Family Medicine

## 2017-01-08 ENCOUNTER — Ambulatory Visit
Admission: RE | Admit: 2017-01-08 | Discharge: 2017-01-08 | Disposition: A | Discharge status: Home / Self Care | Payer: Medicare Other | Source: Ambulatory Visit | Attending: Family Medicine | Admitting: Family Medicine

## 2017-01-08 DIAGNOSIS — M8589 Other specified disorders of bone density and structure, multiple sites: Secondary | ICD-10-CM | POA: Diagnosis not present

## 2017-01-08 DIAGNOSIS — Z13820 Encounter for screening for osteoporosis: Secondary | ICD-10-CM | POA: Diagnosis present

## 2017-01-08 DIAGNOSIS — Z1239 Encounter for other screening for malignant neoplasm of breast: Secondary | ICD-10-CM

## 2017-01-08 DIAGNOSIS — M8588 Other specified disorders of bone density and structure, other site: Secondary | ICD-10-CM | POA: Insufficient documentation

## 2017-01-08 DIAGNOSIS — Z780 Asymptomatic menopausal state: Secondary | ICD-10-CM | POA: Diagnosis not present

## 2017-01-08 DIAGNOSIS — Z1231 Encounter for screening mammogram for malignant neoplasm of breast: Secondary | ICD-10-CM | POA: Insufficient documentation

## 2017-01-08 DIAGNOSIS — M8580 Other specified disorders of bone density and structure, unspecified site: Secondary | ICD-10-CM | POA: Insufficient documentation

## 2017-01-16 ENCOUNTER — Other Ambulatory Visit: Payer: Self-pay | Admitting: *Deleted

## 2017-01-16 ENCOUNTER — Inpatient Hospital Stay
Admission: RE | Admit: 2017-01-16 | Discharge: 2017-01-16 | Disposition: A | Discharge status: Home / Self Care | Payer: Self-pay | Source: Ambulatory Visit | Attending: *Deleted | Admitting: *Deleted

## 2017-01-16 DIAGNOSIS — Z9289 Personal history of other medical treatment: Secondary | ICD-10-CM

## 2017-01-17 DIAGNOSIS — H2513 Age-related nuclear cataract, bilateral: Secondary | ICD-10-CM | POA: Diagnosis not present

## 2017-02-08 DIAGNOSIS — D485 Neoplasm of uncertain behavior of skin: Secondary | ICD-10-CM | POA: Diagnosis not present

## 2017-05-10 DIAGNOSIS — D23122 Other benign neoplasm of skin of left lower eyelid, including canthus: Secondary | ICD-10-CM | POA: Diagnosis not present

## 2017-05-10 DIAGNOSIS — D485 Neoplasm of uncertain behavior of skin: Secondary | ICD-10-CM | POA: Diagnosis not present

## 2017-05-10 DIAGNOSIS — D23111 Other benign neoplasm of skin of right upper eyelid, including canthus: Secondary | ICD-10-CM | POA: Diagnosis not present

## 2017-06-28 ENCOUNTER — Encounter: Payer: Self-pay | Admitting: Family Medicine

## 2017-06-28 ENCOUNTER — Ambulatory Visit (INDEPENDENT_AMBULATORY_CARE_PROVIDER_SITE_OTHER): Payer: Medicare Other | Admitting: Family Medicine

## 2017-06-28 VITALS — BP 134/80 | HR 84 | Temp 99.1°F | Resp 16 | Ht 62.0 in | Wt 208.0 lb

## 2017-06-28 DIAGNOSIS — M25561 Pain in right knee: Secondary | ICD-10-CM

## 2017-06-28 DIAGNOSIS — I10 Essential (primary) hypertension: Secondary | ICD-10-CM

## 2017-06-28 DIAGNOSIS — E7800 Pure hypercholesterolemia, unspecified: Secondary | ICD-10-CM | POA: Diagnosis not present

## 2017-06-28 DIAGNOSIS — Z720 Tobacco use: Secondary | ICD-10-CM | POA: Diagnosis not present

## 2017-06-28 DIAGNOSIS — R7309 Other abnormal glucose: Secondary | ICD-10-CM

## 2017-06-28 LAB — POCT GLYCOSYLATED HEMOGLOBIN (HGB A1C): Hemoglobin A1C: 5.9 — AB (ref ?–5.7)

## 2017-06-28 MED ORDER — HYDROCHLOROTHIAZIDE 12.5 MG PO CAPS: 12.5000 mg | ORAL_CAPSULE | Freq: Every day | ORAL | 1 refills | Status: DC

## 2017-06-28 MED ORDER — ROSUVASTATIN CALCIUM 5 MG PO TABS: 5.0000 mg | ORAL_TABLET | Freq: Every day | ORAL | 1 refills | Status: DC

## 2017-06-28 NOTE — Progress Notes (Signed)
Subjective:    Patient ID: Joanna Morris, female    DOB: [birth year], 67 y.o.   MRN: [id number]  Joanna Morris is a 67 y.o. female presenting on 06/28/2017 for Hyperlipidemia; Hypertension; and Pre-Diabetes   HPI   CHRONIC HTN: No new concerns. Checking BP occasionally at home. Readings controlled Current Meds - HCTZ 12.5mg  Reports did NOT take meds today. Tolerating well, w/o complaints.  ELEVATED A1c / MORBID OBESITY (BMI >38 with comorbid HTN, HLD) Reports no concerns, she was hopeful a1c improved, today up to 5.9 from 5.7 Has family history mother DM Lifestyle: - Weight loss down 8 lbs in 6 months - Diet (Reduced sweets and starch since last visit, now only limits bread to 1-2 slices per day, tried to inc water) - Exercise (She had improved walking after prior knee arthroscopic surgery now limited with return of some pain)  HYPERLIPIDEMIA: - Reports no concerns. Last visit 12/2016 with lipids, reviewed ASCVD risk >22% as a smoker, she was started on Rosuvastatin 5mg  nightly, tolerating well without myalgias. Additionally on ASA 81mg  daily.  TOBACCO ABUSE: Reduced cigarettes down to 8 per day and using nicotine patches, started last week, says using #3 NRT patch, has 14 day supply, will need to order more, lower doses. She plans to quit soon. Previously was smoking 10 cigs or half ppd previously  FOLLOW-UP Chronic R knee pain, s/p R arthroscopic partial medial meniscectomy - Last seen by [facility] Dr Marry Guan 08/2016 after arthroscopic surgery for R medial meniscus, see prior note - Today she states had improved for several months and doing well, until recently started to get gradual worsening pain and swelling localized again to R anterior medial knee - She admits needs to return to Ortho to review this. Not taking any new meds - Has tried ice packs and compression with improvement, elevation helps swelling - Denies any new fall or injury or twist, redness, fever chills,  swelling of other joint   Depression screen [facility] 2/9 06/28/2017 12/27/2016 12/05/2016  Decreased Interest 0 0 0  Down, Depressed, Hopeless 0 0 0  PHQ - 2 Score 0 0 0    Social History   Tobacco Use  . Smoking status: Current Every Day Smoker    Packs/day: 0.50    Years: 25.00    Pack years: 12.50    Types: Cigarettes  . Smokeless tobacco: Current User  . Tobacco comment: Quit once for 2 years.  Substance Use Topics  . Alcohol use: Yes    Comment: 1-3 drinks every other week.  . Drug use: No    Review of Systems Per HPI unless specifically indicated above     Objective:    BP 134/80 (BP Location: Left Arm, Cuff Size: Normal)   Pulse 84   Temp 99.1 F (37.3 C) (Oral)   Resp 16   Ht 5\' 2"  (1.575 m)   Wt 208 lb (94.3 kg)   BMI 38.04 kg/m   Wt Readings from Last 3 Encounters:  06/28/17 208 lb (94.3 kg)  12/27/16 210 lb (95.3 kg)  12/05/16 216 lb 3.2 oz (98.1 kg)    Physical Exam  Constitutional: She is oriented to person, place, and time. She appears well-developed and well-nourished. No distress.  Well-appearing, comfortable, cooperative, obese  HENT:  Head: Normocephalic and atraumatic.  Mouth/Throat: Oropharynx is clear and moist.  Eyes: Conjunctivae are normal. Right eye exhibits no discharge. Left eye exhibits no discharge.  Neck: Normal range of motion. Neck supple.  No thyromegaly present.  Cardiovascular: Normal rate, regular rhythm, normal heart sounds and intact distal pulses.  No murmur heard. Pulmonary/Chest: Effort normal and breath sounds normal. No respiratory distress. She has no wheezes. She has no rales.  Musculoskeletal: Normal range of motion.  Right Bilateral Knee Inspection Palpation: Slightly bulky appearance with soft tissue edema vs effusion medial joint line aspect, mild tender to palpation. No ecchymosis ROM: Mostly full active ROM bilaterally, slightly limited R knee flex due to swelling - Did not re-test meniscus tests today Strength:  5/5 intact knee flex/ext, ankle dorsi/plantarflex Neurovascular: distally intact sensation light touch and pulses  Lymphadenopathy:    She has no cervical adenopathy.  Neurological: She is alert and oriented to person, place, and time.  Skin: Skin is warm and dry. No rash noted. She is not diaphoretic. No erythema.  Psychiatric: She has a normal mood and affect. Her behavior is normal.  Well groomed, good eye contact, normal speech and thoughts  Nursing note and vitals reviewed.    Recent Labs    12/05/16 0953 06/28/17 0820  HGBA1C 5.7* 5.9*   Results for orders placed or performed in visit on 06/28/17  POCT HgB A1C  Result Value Ref Range   Hemoglobin A1C 5.9 (A) 5.7      Assessment & Plan:   Problem List Items Addressed This Visit    Elevated hemoglobin A1c - Primary    Further elevated A1c with 5.9 from 5.7, does not carry new dx PreDM yet Limited exercise again with not walking as much due to knee, but improved diet Concern with obesity, HTN, HLD  Plan:  1. Not indicated for medication 2. Encourage improved lifestyle 3. Follow-up 3 months PreDM A1c      Relevant Orders   POCT HgB A1C (Completed)   Elevated LDL cholesterol level    Stable and tolerating statin now Rosuvastatin 5mg  - refilled today Calculated ASCVD 10 yr risk score elevated at 22.5% (large risk factor with smoking) Continue ASA 81 Will re-check Lipids in 6 months approx 12/2017      Relevant Medications   rosuvastatin (CRESTOR) 5 MG tablet   Hypertension    Seems to be controlled HTN, did not take med today, overall BP seems improved with less knee pain, now some pain resumed and limited exercise - Home BP readings reported normal No known complications  Plan:  1. Continue current BP regimen - HCTZ 12.5mg  - refilled today 90 day supply 2. Encourage improved lifestyle - low sodium diet, regular exercise - need to return to ortho for knee 3. Continue monitor BP outside office, bring readings  to next visit, if persistently >140/90 or new symptoms notify office sooner 4. Follow-up 3 months      Relevant Medications   hydrochlorothiazide (MICROZIDE) 12.5 MG capsule   rosuvastatin (CRESTOR) 5 MG tablet   Morbid obesity (HCC)    Clinically considered morbid obesity with BMI >38, with 4 comorbid conditions HLD, HTN, GERD, OA Again improved weight loss down 6 lbs in 6 months, had improved exercise now knee painful and swelling again  Encouraged continue improve diet and exercise lifestyle towards goal weight Follow-up 3 months      Relevant Medications   calcium carbonate (TUMS - DOSED IN MG ELEMENTAL CALCIUM) 500 MG chewable tablet   Right medial knee pain    Concerning now with return of R medial knee pain and swelling, w/o injury, approx 8 months after her arthroscopic surgery Recommend continue RICE therapy, avoid injury  May use Tylenol PRN Future consider topical Diclofenac She should return to [facility] Ortho Dr Marry Guan for re-evaluation before any worsening - handout given w/ #      Tobacco abuse    Actively trying to quit smoking, reducing cigs down to 8 per day Just started NRT patches 1 week ago on 21mg  by her report, plan to taper nicotine dose down Reviewed when to quit on patches, dose taper every 2-4 weeks, handout given  Discussion today >5 minutes (<10 minutes) specifically on counseling on risks of tobacco use, complications, treatment, smoking cessation.         Meds ordered this encounter  Medications  . hydrochlorothiazide (MICROZIDE) 12.5 MG capsule    Sig: Take 1 capsule (12.5 mg total) by mouth daily.    Dispense:  90 capsule    Refill:  1    Please keep this refill on file until patient requests it. She is not due for refill yet. 90 day supply requested  . rosuvastatin (CRESTOR) 5 MG tablet    Sig: Take 1 tablet (5 mg total) by mouth at bedtime.    Dispense:  90 tablet    Refill:  1      Follow up plan: Return in about 3 months (around  09/27/2017) for PreDM A1c, HTN, Smoking, R Knee Pain.  Nobie Putnam, [facility] 06/28/2017, 8:49 AM

## 2017-06-28 NOTE — Assessment & Plan Note (Signed)
Further elevated A1c with 5.9 from 5.7, does not carry new dx PreDM yet Limited exercise again with not walking as much due to knee, but improved diet Concern with obesity, HTN, HLD  Plan:  1. Not indicated for medication 2. Encourage improved lifestyle 3. Follow-up 3 months PreDM A1c

## 2017-06-28 NOTE — Patient Instructions (Addendum)
Thank you for coming to the office today.  Please contact Dr Marry Guan to re-evaluate your Right knee with the return of pain and swelling  Dr Skip Estimable  [address]  [place], [place] [address]  [phone]   Refilled BP med and cholesterol med  A1c 5.9 today, slightly elevated  Try to improve diet and exercise as you are, if knee is treated then maybe can stay more active  BP is controlled  Congratulations on reducing smoking, keep using patches   Please schedule a Follow-up Appointment to: Return in about 3 months (around 09/27/2017) for PreDM A1c, HTN, Smoking, R Knee Pain.  If you have any other questions or concerns, please feel free to call the office or send a message through [place]. You may also schedule an earlier appointment if necessary.  Additionally, you may be receiving a survey about your experience at our office within a few days to 1 week by e-mail or mail. We value your feedback.  Nobie Putnam, DO [place]

## 2017-06-28 NOTE — Assessment & Plan Note (Addendum)
Seems to be controlled HTN, did not take med today, overall BP seems improved with less knee pain, now some pain resumed and limited exercise - Home BP readings reported normal No known complications  Plan:  1. Continue current BP regimen - HCTZ 12.5mg  - refilled today 90 day supply 2. Encourage improved lifestyle - low sodium diet, regular exercise - need to return to ortho for knee 3. Continue monitor BP outside office, bring readings to next visit, if persistently >140/90 or new symptoms notify office sooner 4. Follow-up 3 months

## 2017-06-28 NOTE — Assessment & Plan Note (Signed)
Concerning now with return of R medial knee pain and swelling, w/o injury, approx 8 months after her arthroscopic surgery Recommend continue RICE therapy, avoid injury May use Tylenol PRN Future consider topical Diclofenac She should return to [place] for re-evaluation before any worsening - handout given w/ #

## 2017-06-28 NOTE — Assessment & Plan Note (Signed)
Stable and tolerating statin now Rosuvastatin 5mg  - refilled today Calculated ASCVD 10 yr risk score elevated at 22.5% (large risk factor with smoking) Continue ASA 81 Will re-check Lipids in 6 months approx 12/2017

## 2017-06-28 NOTE — Assessment & Plan Note (Signed)
Clinically considered morbid obesity with BMI >38, with 4 comorbid conditions HLD, HTN, GERD, OA Again improved weight loss down 6 lbs in 6 months, had improved exercise now knee painful and swelling again  Encouraged continue improve diet and exercise lifestyle towards goal weight Follow-up 3 months

## 2017-06-28 NOTE — Assessment & Plan Note (Signed)
Actively trying to quit smoking, reducing cigs down to 8 per day Just started NRT patches 1 week ago on 21mg  by her report, plan to taper nicotine dose down Reviewed when to quit on patches, dose taper every 2-4 weeks, handout given  Discussion today >5 minutes (<10 minutes) specifically on counseling on risks of tobacco use, complications, treatment, smoking cessation.

## 2017-07-05 DIAGNOSIS — M25561 Pain in right knee: Secondary | ICD-10-CM | POA: Diagnosis not present

## 2017-07-05 DIAGNOSIS — M2391 Unspecified internal derangement of right knee: Secondary | ICD-10-CM | POA: Diagnosis not present

## 2017-10-02 ENCOUNTER — Other Ambulatory Visit: Payer: Self-pay | Admitting: Family Medicine

## 2017-10-02 ENCOUNTER — Encounter: Payer: Self-pay | Admitting: Family Medicine

## 2017-10-02 ENCOUNTER — Ambulatory Visit (INDEPENDENT_AMBULATORY_CARE_PROVIDER_SITE_OTHER): Payer: Medicare Other | Admitting: Family Medicine

## 2017-10-02 VITALS — BP 130/72 | HR 74 | Temp 98.8°F | Resp 16 | Ht 62.0 in | Wt 204.0 lb

## 2017-10-02 DIAGNOSIS — Z720 Tobacco use: Secondary | ICD-10-CM | POA: Diagnosis not present

## 2017-10-02 DIAGNOSIS — Z0000 Encounter for general adult medical examination without abnormal findings: Secondary | ICD-10-CM

## 2017-10-02 DIAGNOSIS — I10 Essential (primary) hypertension: Secondary | ICD-10-CM | POA: Diagnosis not present

## 2017-10-02 DIAGNOSIS — M25561 Pain in right knee: Secondary | ICD-10-CM

## 2017-10-02 DIAGNOSIS — R7309 Other abnormal glucose: Secondary | ICD-10-CM

## 2017-10-02 DIAGNOSIS — E7800 Pure hypercholesterolemia, unspecified: Secondary | ICD-10-CM

## 2017-10-02 DIAGNOSIS — M8580 Other specified disorders of bone density and structure, unspecified site: Secondary | ICD-10-CM

## 2017-10-02 DIAGNOSIS — E559 Vitamin D deficiency, unspecified: Secondary | ICD-10-CM

## 2017-10-02 LAB — POCT GLYCOSYLATED HEMOGLOBIN (HGB A1C): HEMOGLOBIN A1C: 6 % — AB (ref 4.0–5.6)

## 2017-10-02 NOTE — Assessment & Plan Note (Signed)
Improved s/p recent steroid injection per [facility] Orth[facility] Follow-up as planned with ortho Gradually increase activity, avoid re-injury

## 2017-10-02 NOTE — Progress Notes (Signed)
Subjective:    Patient ID: Joanna Morris, female    DOB: 26-Sep-1950, 67 y.o.   MRN: [id number]  Joanna Morris is a 67 y.o. female presenting on 10/02/2017 for prediabetes; Arthritis; and Hypertension   HPI   CHRONIC HTN: No new concerns. Checking BP occasionally at home Current Meds - HCTZ 12.5mg  Reports took meds today. Tolerating well, w/o complaints. Denies CP, dyspnea, HA, edema, dizziness / lightheadedness  ELEVATED A1c/ MORBID OBESITY (BMI >37 with comorbid HTN, HLD) Reports no concerns. A1c up from 5.9 up to 6.0 today. Has family history mother DM Lifestyle: - Weight loss still down 4 lbs in 3 months - Diet (Still improving diet - reducing sweets and starch since last visit, now only limits bread, tried to inc water) - Exercise (Improving walking - trying to avoid overuse on R knee now feeling better)  TOBACCO ABUSE: Still smoking. Reduced cigarettes to < 10 per day still. Using nicotine patches, limited effective for her. She will try to self taper and quit. She considered Chantix, her brother took this and quit and recommended it. Interested but not ready.  FOLLOW-UP Chronic R knee pain, s/p R arthroscopic partial medial meniscectomy - Last seen by [facility] 07/05/2017, with updated R knee x-ray showed - mild degenerative changes to medial compartment of knee, given s/p steroid injection, with significant improvement and reduced pain. - Today she reports pain improved and she is a little more active - Denies any new fall or injury or twist, redness, fever chills, swelling of other joint   Depression screen [facility] 2/9 10/02/2017 06/28/2017 12/27/2016  Decreased Interest 0 0 0  Down, Depressed, Hopeless 0 0 0  PHQ - 2 Score 0 0 0    Social History   Tobacco Use  . Smoking status: Current Every Day Smoker    Packs/day: 0.25    Years: 25.00    Pack years: 6.25    Types: Cigarettes  . Smokeless tobacco: Current User  . Tobacco comment: Quit once for 2 years.    Substance Use Topics  . Alcohol use: Yes    Comment: 1-3 drinks every other week.  . Drug use: No    Review of Systems Per HPI unless specifically indicated above     Objective:    BP 130/72   Pulse 74   Temp 98.8 F (37.1 C) (Oral)   Resp 16   Ht 5\' 2"  (1.575 m)   Wt 204 lb (92.5 kg)   BMI 37.31 kg/m   Wt Readings from Last 3 Encounters:  10/02/17 204 lb (92.5 kg)  06/28/17 208 lb (94.3 kg)  12/27/16 210 lb (95.3 kg)    Physical Exam  Constitutional: She is oriented to person, place, and time. She appears well-developed and well-nourished. No distress.  Well-appearing, comfortable, cooperative, obese  HENT:  Head: Normocephalic and atraumatic.  Mouth/Throat: Oropharynx is clear and moist.  Eyes: Conjunctivae are normal. Right eye exhibits no discharge. Left eye exhibits no discharge.  Neck: Normal range of motion. Neck supple. No thyromegaly present.  Cardiovascular: Normal rate, regular rhythm, normal heart sounds and intact distal pulses.  No murmur heard. Pulmonary/Chest: Effort normal and breath sounds normal. No respiratory distress. She has no wheezes. She has no rales.  Musculoskeletal: Normal range of motion. She exhibits no edema.  Lymphadenopathy:    She has no cervical adenopathy.  Neurological: She is alert and oriented to person, place, and time.  Skin: Skin is warm and dry. No rash  noted. She is not diaphoretic. No erythema.  Psychiatric: She has a normal mood and affect. Her behavior is normal.  Well groomed, good eye contact, normal speech and thoughts  Nursing note and vitals reviewed.    Recent Labs    12/05/16 0953 06/28/17 0820 10/02/17 0818  HGBA1C 5.7* 5.9* 6.0*    Results for orders placed or performed in visit on 10/02/17  POCT HgB A1C  Result Value Ref Range   Hemoglobin A1C 6.0 (A) 4.0 - 5.6 %   HbA1c POC (<> result, manual entry)  4.0 - 5.6 %   HbA1c, POC (prediabetic range)  5.7 - 6.4 %   HbA1c, POC (controlled diabetic  range)  0.0 - 7.0 %      Assessment & Plan:   Problem List Items Addressed This Visit    Elevated hemoglobin A1c - Primary    Mild elevated A1c to 6.0, from prior 5.7 to 5.9 Concern with obesity, HTN, HLD  Plan:  1. Not on any therapy currently  - remain off 2. Encourage improved lifestyle - low carb, low sugar diet, reduce portion size, continue improving regular exercise now with improved knee 3. Follow-up 3 months annual phys + labs A1c       Relevant Orders   POCT HgB A1C (Completed)   Hypertension    Controlled HTN. Improved with less knee pain - Home BP readings reported normal No known complications  Plan:  1. Continue current BP regimen - HCTZ 12.5mg  2. Encourage improved lifestyle - low sodium diet, regular exercise - need to return to ortho for knee 3. Continue monitor BP outside office, bring readings to next visit, if persistently >140/90 or new symptoms notify office sooner 4. Follow-up 3 months      Right medial knee pain    Improved s/p recent steroid injection per [facility] Follow-up as planned with ortho Gradually increase activity, avoid re-injury      Tobacco abuse    Ready to quit Continue to taper down cigarettes < 8-10 now, finish NRT patches Offered Chantix, she is not ready - will reconsider         No orders of the defined types were placed in this encounter.   Follow up plan: Return in about 3 months (around 01/02/2018) for Annual Physical.  Future labs ordered for 12/31/17  Nobie Putnam, [facility] 10/02/2017, 12:59 PM

## 2017-10-02 NOTE — Assessment & Plan Note (Signed)
Ready to quit Continue to taper down cigarettes < 8-10 now, finish NRT patches Offered Chantix, she is not ready - will reconsider

## 2017-10-02 NOTE — Assessment & Plan Note (Signed)
Mild elevated A1c to 6.0, from prior 5.7 to 5.9 Concern with obesity, HTN, HLD  Plan:  1. Not on any therapy currently  - remain off 2. Encourage improved lifestyle - low carb, low sugar diet, reduce portion size, continue improving regular exercise now with improved knee 3. Follow-up 3 months annual phys + labs A1c

## 2017-10-02 NOTE — Patient Instructions (Addendum)
Thank you for coming to the office today.  A1c 6.0, slightly increased from last time 5.9. But overall stable. Keep reducing starches and carbs  BP is controlled  I am glad knee is feeling better. Stay active and avoid overuse.  Try to cut back cigarettes on your own - if you decide to start Chantix, call me and request the new rx.  DUE for FASTING BLOOD WORK (no food or drink after midnight before the lab appointment, only water or coffee without cream/sugar on the morning of)  SCHEDULE "Lab Only" visit in the morning at the clinic for lab draw in 3 MONTHS   - Make sure Lab Only appointment is at about 1 week before your next appointment, so that results will be available  For Lab Results, once available within 2-3 days of blood draw, you can can log in to MyChart online to view your results and a brief explanation. Also, we can discuss results at next follow-up visit.    Please schedule a Follow-up Appointment to: Return in about 3 months (around 01/02/2018) for Annual Physical.  If you have any other questions or concerns, please feel free to call the office or send a message through [employer]. You may also schedule an earlier appointment if necessary.  Additionally, you may be receiving a survey about your experience at our office within a few days to 1 week by e-mail or mail. We value your feedback.  Nobie Putnam, DO [place]

## 2017-10-02 NOTE — Assessment & Plan Note (Signed)
Controlled HTN. Improved with less knee pain - Home BP readings reported normal No known complications  Plan:  1. Continue current BP regimen - HCTZ 12.5mg  2. Encourage improved lifestyle - low sodium diet, regular exercise - need to return to ortho for knee 3. Continue monitor BP outside office, bring readings to next visit, if persistently >140/90 or new symptoms notify office sooner 4. Follow-up 3 months

## 2017-12-10 ENCOUNTER — Other Ambulatory Visit: Payer: Self-pay | Admitting: Family Medicine

## 2017-12-10 DIAGNOSIS — Z1231 Encounter for screening mammogram for malignant neoplasm of breast: Secondary | ICD-10-CM

## 2017-12-31 ENCOUNTER — Other Ambulatory Visit: Payer: Medicare Other

## 2017-12-31 DIAGNOSIS — R7309 Other abnormal glucose: Secondary | ICD-10-CM | POA: Diagnosis not present

## 2017-12-31 DIAGNOSIS — E559 Vitamin D deficiency, unspecified: Secondary | ICD-10-CM | POA: Diagnosis not present

## 2017-12-31 DIAGNOSIS — I10 Essential (primary) hypertension: Secondary | ICD-10-CM

## 2017-12-31 DIAGNOSIS — E7800 Pure hypercholesterolemia, unspecified: Secondary | ICD-10-CM | POA: Diagnosis not present

## 2017-12-31 DIAGNOSIS — Z0000 Encounter for general adult medical examination without abnormal findings: Secondary | ICD-10-CM

## 2017-12-31 DIAGNOSIS — M8580 Other specified disorders of bone density and structure, unspecified site: Secondary | ICD-10-CM

## 2018-01-01 LAB — VITAMIN D 25 HYDROXY (VIT D DEFICIENCY, FRACTURES): Vit D, 25-Hydroxy: 32 ng/mL (ref 30–100)

## 2018-01-01 LAB — COMPLETE METABOLIC PANEL WITH GFR
AG Ratio: 1.2 (calc) (ref 1.0–2.5)
ALT: 45 U/L — AB (ref 6–29)
AST: 63 U/L — AB (ref 10–35)
Albumin: 3.7 g/dL (ref 3.6–5.1)
Alkaline phosphatase (APISO): 41 U/L (ref 33–130)
BUN: 13 mg/dL (ref 7–25)
CALCIUM: 9.1 mg/dL (ref 8.6–10.4)
CHLORIDE: 103 mmol/L (ref 98–110)
CO2: 35 mmol/L — AB (ref 20–32)
CREATININE: 0.82 mg/dL (ref 0.50–0.99)
GFR, EST AFRICAN AMERICAN: 85 mL/min/{1.73_m2} (ref >=60)
GFR, EST NON AFRICAN AMERICAN: 73 mL/min/{1.73_m2} (ref >=60)
GLOBULIN: 3.1 g/dL (ref 1.9–3.7)
Glucose, Bld: 105 mg/dL — ABNORMAL HIGH (ref 65–99)
Potassium: 3.9 mmol/L (ref 3.5–5.3)
Sodium: 144 mmol/L (ref 135–146)
TOTAL PROTEIN: 6.8 g/dL (ref 6.1–8.1)
Total Bilirubin: 0.6 mg/dL (ref 0.2–1.2)

## 2018-01-01 LAB — CBC WITH DIFFERENTIAL/PLATELET
BASOS PCT: 0.6 %
Basophils Absolute: 22 cells/uL (ref 0–200)
EOS PCT: 1.7 %
Eosinophils Absolute: 61 cells/uL (ref 15–500)
HEMATOCRIT: 43.5 % (ref 35.0–45.0)
HEMOGLOBIN: 14.5 g/dL (ref 11.7–15.5)
LYMPHS ABS: 1706 {cells}/uL (ref 850–3900)
MCH: 29.7 pg (ref 27.0–33.0)
MCHC: 33.3 g/dL (ref 32.0–36.0)
MCV: 89.1 fL (ref 80.0–100.0)
MPV: 11.5 fL (ref 7.5–12.5)
Monocytes Relative: 9.2 %
NEUTROS ABS: 1480 {cells}/uL — AB (ref 1500–7800)
Neutrophils Relative %: 41.1 %
Platelets: 151 10*3/uL (ref 140–400)
RBC: 4.88 10*6/uL (ref 3.80–5.10)
RDW: 12.7 % (ref 11.0–15.0)
Total Lymphocyte: 47.4 %
WBC mixed population: 331 cells/uL (ref 200–950)
WBC: 3.6 10*3/uL — AB (ref 3.8–10.8)

## 2018-01-01 LAB — LIPID PANEL
CHOL/HDL RATIO: 2.6 (calc) (ref <5.0)
Cholesterol: 163 mg/dL (ref <200)
HDL: 63 mg/dL (ref >50)
LDL Cholesterol (Calc): 84 mg/dL (calc)
Non-HDL Cholesterol (Calc): 100 mg/dL (calc) (ref <130)
Triglycerides: 69 mg/dL (ref <150)

## 2018-01-01 LAB — HEMOGLOBIN A1C
HEMOGLOBIN A1C: 5.6 %{Hb} (ref <5.7)
Mean Plasma Glucose: 114 (calc)
eAG (mmol/L): 6.3 (calc)

## 2018-01-04 ENCOUNTER — Ambulatory Visit (INDEPENDENT_AMBULATORY_CARE_PROVIDER_SITE_OTHER): Payer: Medicare Other | Admitting: Family Medicine

## 2018-01-04 ENCOUNTER — Encounter: Payer: Self-pay | Admitting: Family Medicine

## 2018-01-04 VITALS — BP 141/85 | HR 71 | Temp 98.8°F | Resp 16 | Ht 62.0 in | Wt 194.0 lb

## 2018-01-04 DIAGNOSIS — Z0000 Encounter for general adult medical examination without abnormal findings: Secondary | ICD-10-CM

## 2018-01-04 NOTE — Progress Notes (Signed)
Patient was initially roomed by CMA, vitals taken and then after briefly waiting patient decided to leave and re-schedule, she had a child with her and she plans to return at future date instead. Patient was never examined today by me. No medical advice or treatment was given.  Nobie Putnam, [facility] 01/04/2018, 2:04 PM

## 2018-01-09 ENCOUNTER — Ambulatory Visit
Admission: RE | Admit: 2018-01-09 | Discharge: 2018-01-09 | Disposition: A | Discharge status: Home / Self Care | Payer: Medicare Other | Source: Ambulatory Visit | Attending: Family Medicine | Admitting: Family Medicine

## 2018-01-09 DIAGNOSIS — Z1231 Encounter for screening mammogram for malignant neoplasm of breast: Secondary | ICD-10-CM | POA: Diagnosis not present

## 2018-01-16 ENCOUNTER — Encounter: Payer: Self-pay | Admitting: Family Medicine

## 2018-01-16 ENCOUNTER — Ambulatory Visit (INDEPENDENT_AMBULATORY_CARE_PROVIDER_SITE_OTHER): Payer: Medicare Other | Admitting: Family Medicine

## 2018-01-16 VITALS — BP 138/84 | HR 67 | Temp 98.6°F | Resp 16 | Ht 62.0 in | Wt 196.0 lb

## 2018-01-16 DIAGNOSIS — Z0000 Encounter for general adult medical examination without abnormal findings: Secondary | ICD-10-CM

## 2018-01-16 DIAGNOSIS — Z720 Tobacco use: Secondary | ICD-10-CM

## 2018-01-16 DIAGNOSIS — E7800 Pure hypercholesterolemia, unspecified: Secondary | ICD-10-CM

## 2018-01-16 DIAGNOSIS — I10 Essential (primary) hypertension: Secondary | ICD-10-CM | POA: Diagnosis not present

## 2018-01-16 DIAGNOSIS — R7309 Other abnormal glucose: Secondary | ICD-10-CM

## 2018-01-16 DIAGNOSIS — F17200 Nicotine dependence, unspecified, uncomplicated: Secondary | ICD-10-CM | POA: Diagnosis not present

## 2018-01-16 DIAGNOSIS — Z23 Encounter for immunization: Secondary | ICD-10-CM

## 2018-01-16 DIAGNOSIS — J3089 Other allergic rhinitis: Secondary | ICD-10-CM

## 2018-01-16 MED ORDER — VARENICLINE TARTRATE 0.5 MG X 11 & 1 MG X 42 PO MISC: ORAL | 0 refills | Status: DC

## 2018-01-16 NOTE — Assessment & Plan Note (Signed)
Controlled cholesterol on statin / lifestyle Last lipid panel 12/2017 Calculated ASCVD 10 yr risk score 22.5% as smoker, if quit down to 12%  Plan: 1. Continue current meds - Rosuvastatin 5mg  daily 2. Continue ASA 81mg  for primary ASCVD risk reduction 3. Encourage improved lifestyle - low carb/cholesterol, reduce portion size, continue improving regular exercise 4. Follow-up yearly lipid

## 2018-01-16 NOTE — Patient Instructions (Addendum)
Thank you for coming to the office today.  BP is improved. Try to keep a watch on this, avoid high salt foods and stay hydrated.  Keep up great work! Sugar Is well controlled 5.6. Continue walking and low sugar foods.  Stick with plan on chantix  For your smoking cessation, here is the plan: Start Chantix - Choose a quit date in the future. Start taking the medicine about 1-2 weeks away from your quit date. Reduce the number of cigarettes daily until you eventually QUIT completely on your quit date. Continue taking the medicine  After 3-4 weeks if doing very well and still quit - can let me know by phone and we can send refill for medicine to continue up to 3 total months if need help then SCHEDULE office visit to return to see me.   Low Dose Chest CT Lung CA Screening - Age 31-74 - Smoking history >30 pack years - Annual screening for 15 years after quit date  [facility]) Burgess Estelle, RN, [place] Smoking Cessation Class Ph: [phone]    Please schedule a Follow-up Appointment to: Return in about 6 months (around 07/17/2018) for 6 month follow-up PreDM A1c, HTN, Wt, Smoking(quit?).  If you have any other questions or concerns, please feel free to call the office or send a message through [place]. You may also schedule an earlier appointment if necessary.  Additionally, you may be receiving a survey about your experience at our office within a few days to 1 week by e-mail or mail. We value your feedback.  Joanna Putnam, DO [place]

## 2018-01-16 NOTE — Assessment & Plan Note (Signed)
Improved wt loss down to BMI >35, still with co morbid conditions HLD, HTN, GERD, OA  Encourage continue improving diet / lifestyle plan, improved activity now w/ improved Knee pain

## 2018-01-16 NOTE — Assessment & Plan Note (Addendum)
Ready to quit Active smoker, approx 0.25 to 0.5ppd now, taper down Not tried medicines, tried NRT  Plan: 1. Start Varenicline (Chantix) Day 1-3: 0.5mg  once daily WITH FOOD, Days 4-7: increase to 0.5mg  BID, then maintenance dose after 1 week: 1mg  BID for up to 12 weeks - Set quit date 1 week after start of medication, alternatively if unable to quit, then set goal reduce by 50% or more by 4 weeks, then another 50% reduction in 4 more weeks, and lastly quit after final 4 weeks (total 12 week therapy) - Counseling on side effects primarily nausea (take with food), headaches, insomnia, vivid dreams, mood instability, seizure, rare risk of suicidal ideation, if any serious agitation or acute depression severe mood changes need to stop med  Discussion today >5 minutes (<10 minutes) specifically on counseling on risks of tobacco use, complications, treatment, smoking cessation.

## 2018-01-16 NOTE — Progress Notes (Signed)
Subjective:    Patient ID: Joanna Morris, female    DOB: April 26, 1950, 67 y.o.   MRN: [id number]  Joanna Morris is a 67 y.o. female presenting on 01/16/2018 for Annual Exam   HPI   Here for Annual Physical and Lab Review.  CHRONIC HTN: Home BP check from wellness visit from nurse recently was 122/78. She check occasionally normal range. Current Meds - HCTZ 12.5mg daily Reportstook meds today. Tolerating well, w/o complaints.  ELEVATED A1c/ MORBID OBESITY (BMI >35 with comorbid HTN, HLD) Interval update - weight loss 10 lbs in 3-4 months, maintaining weight more recently Last result A1c 5.6, improved from 6.0 Has family history mother DM Lifestyle: - Diet (Still improving diet - still reducing sweets and starch, now only limits bread, tried to inc water) - Exercise (Still walking regularly, has not had problem with R knee)  HYPERLIPIDEMIA: - Reports no concerns. Last lipid panel 12/2017, controlled  Recent lab showed mild elevated LFTs - Currently taking Rosuvastatin 5mg  daily, tolerating well without side effects or myalgias  TOBACCO ABUSE: Now ready to quit smoking. Up to 0.5ppd smoking daily, she is ready now, family member took Chantix with good results, she request rx today and will plan quit date in 2 weeks.  PMHChronic R knee pain, s/p R arthroscopic partial medial meniscectomy  Health Maintenance: - Due for Flu Shot, will receive today  Breast CA Screening: UTD Mammogram, last done 3D at [facility] on 01/09/18 for mammogram screening. Last mammogram result negative Bi-rads . No prior history abnormal mammogram. No known family history of breast cancer. Currently asymptomatic.  - Declines Pneumonia vaccine today   Depression screen [facility] 2/9 01/16/2018 01/04/2018 10/02/2017  Decreased Interest 0 0 0  Down, Depressed, Hopeless 0 0 0  PHQ - 2 Score 0 0 0    Past Medical History:  Diagnosis Date  . Cataract   . Genital warts   . GERD (gastroesophageal reflux  disease)    H/O  . History of kidney stones    Past Surgical History:  Procedure Laterality Date  . ABDOMINAL HYSTERECTOMY    . CESAREAN SECTION     X2  . KNEE ARTHROSCOPY WITH MEDIAL MENISECTOMY Right 05/22/2016   Procedure: KNEE ARTHROSCOPY WITH PARTIAL MEDIAL MENISECTOMY;  Surgeon: Dereck Leep, MD;  Location: [facility] ORS;  Service: Orthopedics;  Laterality: Right;  . OOPHORECTOMY     Social History   Socioeconomic History  . Marital status: Divorced    Spouse name: Not on file  . Number of children: Not on file  . Years of education: [employer]  . Highest education level: Not on file  Occupational History  . Occupation: Retired  Scientific laboratory technician  . Financial resource strain: Not on file  . Food insecurity:    Worry: Not on file    Inability: Not on file  . Transportation needs:    Medical: Not on file    Non-medical: Not on file  Tobacco Use  . Smoking status: Current Every Day Smoker    Packs/day: 0.50    Years: 25.00    Pack years: 12.50    Types: Cigarettes  . Smokeless tobacco: Current User  . Tobacco comment: Quit once for 2 years.  Substance and Sexual Activity  . Alcohol use: Yes    Comment: 1-3 drinks every other week.  . Drug use: No  . Sexual activity: Not on file  Lifestyle  . Physical activity:    Days per week: Not on file  Minutes per session: Not on file  . Stress: Not on file  Relationships  . Social connections:    Talks on phone: Not on file    Gets together: Not on file    Attends religious service: Not on file    Active member of club or organization: Not on file    Attends meetings of clubs or organizations: Not on file    Relationship status: Not on file  . Intimate partner violence:    Fear of current or ex partner: Not on file    Emotionally abused: Not on file    Physically abused: Not on file    Forced sexual activity: Not on file  Other Topics Concern  . Not on file  Social History Narrative  . Not on file   Family History   Problem Relation Age of Onset  . Heart disease Mother 96       MI  . Heart attack Mother 40  . Diabetes Mother   . Pancreatic cancer Sister   . Breast cancer Neg Hx    Current Outpatient Medications on File Prior to Visit  Medication Sig  . aspirin EC 81 MG tablet Take 1 tablet (81 mg total) by mouth daily.  . calcium carbonate (TUMS - DOSED IN MG ELEMENTAL CALCIUM) 500 MG chewable tablet Chew 1 tablet by mouth daily.  . Cholecalciferol (VITAMIN D3) 1000 units CAPS Take 1,000 Units by mouth daily.  . fluticasone (FLONASE) 50 MCG/ACT nasal spray Place 2 sprays into both nostrils daily. Use for 4-6 weeks then stop and use seasonally or as needed.  . hydrochlorothiazide (MICROZIDE) 12.5 MG capsule Take 1 capsule (12.5 mg total) by mouth daily.  Marland Kitchen ketotifen (ZADITOR) 0.025 % ophthalmic solution Place 1-2 drops into both eyes 2 (two) times daily as needed (for itchy eyes).  Marland Kitchen loratadine (CLARITIN) 10 MG tablet Take 1 tablet (10 mg total) by mouth daily. Use for 4-6 weeks then stop, and use as needed or seasonally  . rosuvastatin (CRESTOR) 5 MG tablet Take 1 tablet (5 mg total) by mouth at bedtime.   No current facility-administered medications on file prior to visit.     Review of Systems  Constitutional: Negative for activity change, appetite change, chills, diaphoresis, fatigue and fever.  HENT: Negative for congestion and hearing loss.   Eyes: Negative for visual disturbance.  Respiratory: Negative for apnea, cough, choking, chest tightness, shortness of breath and wheezing.   Cardiovascular: Negative for chest pain, palpitations and leg swelling.  Gastrointestinal: Negative for abdominal pain, anal bleeding, blood in stool, constipation, diarrhea, nausea and vomiting.  Endocrine: Negative for cold intolerance.  Genitourinary: Negative for difficulty urinating, dysuria, frequency and hematuria.  Musculoskeletal: Negative for arthralgias, back pain and neck pain.  Skin: Negative for  rash.  Allergic/Immunologic: Negative for environmental allergies.  Neurological: Negative for dizziness, weakness, light-headedness, numbness and headaches.  Hematological: Negative for adenopathy.  Psychiatric/Behavioral: Negative for behavioral problems, dysphoric mood and sleep disturbance. The patient is not nervous/anxious.    Per HPI unless specifically indicated above      Objective:    BP 138/84 (BP Location: Left Arm, Cuff Size: Normal)   Pulse 67   Temp 98.6 F (37 C) (Oral)   Resp 16   Ht 5\' 2"  (1.575 m)   Wt 196 lb (88.9 kg)   BMI 35.85 kg/m   Wt Readings from Last 3 Encounters:  01/16/18 196 lb (88.9 kg)  01/04/18 194 lb (88 kg)  10/02/17 204  lb (92.5 kg)    Physical Exam  Constitutional: She is oriented to person, place, and time. She appears well-developed and well-nourished. No distress.  Well-appearing, comfortable, cooperative, obese with some wt loss  HENT:  Head: Normocephalic and atraumatic.  Mouth/Throat: Oropharynx is clear and moist.  Eyes: Pupils are equal, round, and reactive to light. Conjunctivae and EOM are normal. Right eye exhibits no discharge. Left eye exhibits no discharge.  Neck: Normal range of motion. Neck supple. No thyromegaly present.  Cardiovascular: Normal rate, regular rhythm, normal heart sounds and intact distal pulses.  No murmur heard. Pulmonary/Chest: Effort normal and breath sounds normal. No respiratory distress. She has no wheezes. She has no rales.  Abdominal: Soft. Bowel sounds are normal. She exhibits no distension and no mass. There is no tenderness.  Musculoskeletal: Normal range of motion. She exhibits no edema or tenderness.  Upper / Lower Extremities: - Normal muscle tone, strength bilateral upper extremities 5/5, lower extremities 5/5  Lymphadenopathy:    She has no cervical adenopathy.  Neurological: She is alert and oriented to person, place, and time.  Distal sensation intact to light touch all extremities    Skin: Skin is warm and dry. No rash noted. She is not diaphoretic. No erythema.  Psychiatric: She has a normal mood and affect. Her behavior is normal.  Well groomed, good eye contact, normal speech and thoughts  Nursing note and vitals reviewed.  Results for orders placed or performed in visit on 12/31/17  VITAMIN D 25 Hydroxy (Vit-D Deficiency, Fractures)  Result Value Ref Range   Vit D, 25-Hydroxy 32 30 - 100 ng/mL  Lipid panel  Result Value Ref Range   Cholesterol 163 <200 mg/dL   HDL 63 >50 mg/dL   Triglycerides 69 <150 mg/dL   LDL Cholesterol (Calc) 84 mg/dL (calc)   Total CHOL/HDL Ratio 2.6 <5.0 (calc)   Non-HDL Cholesterol (Calc) 100 <130 mg/dL (calc)  COMPLETE METABOLIC PANEL WITH GFR  Result Value Ref Range   Glucose, Bld 105 (H) 65 - 99 mg/dL   BUN 13 7 - 25 mg/dL   Creat 0.82 0.50 - 0.99 mg/dL   GFR, Est Non African American 73 > OR = 60 mL/min/1.41m2   GFR, Est African American 85 > OR = 60 mL/min/1.63m2   BUN/Creatinine Ratio NOT APPLICABLE 6 - 22 (calc)   Sodium 144 135 - 146 mmol/L   Potassium 3.9 3.5 - 5.3 mmol/L   Chloride 103 98 - 110 mmol/L   CO2 35 (H) 20 - 32 mmol/L   Calcium 9.1 8.6 - 10.4 mg/dL   Total Protein 6.8 6.1 - 8.1 g/dL   Albumin 3.7 3.6 - 5.1 g/dL   Globulin 3.1 1.9 - 3.7 g/dL (calc)   AG Ratio 1.2 1.0 - 2.5 (calc)   Total Bilirubin 0.6 0.2 - 1.2 mg/dL   Alkaline phosphatase (APISO) 41 33 - 130 U/L   AST 63 (H) 10 - 35 U/L   ALT 45 (H) 6 - 29 U/L  CBC with Differential/Platelet  Result Value Ref Range   WBC 3.6 (L) 3.8 - 10.8 Thousand/uL   RBC 4.88 3.80 - 5.10 Million/uL   Hemoglobin 14.5 11.7 - 15.5 g/dL   HCT 43.5 35.0 - 45.0 %   MCV 89.1 80.0 - 100.0 fL   MCH 29.7 27.0 - 33.0 pg   MCHC 33.3 32.0 - 36.0 g/dL   RDW 12.7 11.0 - 15.0 %   Platelets 151 140 - 400 Thousand/uL   MPV 11.5  7.5 - 12.5 fL   Neutro Abs 1,480 (L) 1,500 - 7,800 cells/uL   Lymphs Abs 1,706 850 - 3,900 cells/uL   WBC mixed population 331 200 - 950 cells/uL    Eosinophils Absolute 61 15 - 500 cells/uL   Basophils Absolute 22 0 - 200 cells/uL   Neutrophils Relative % 41.1 %   Total Lymphocyte 47.4 %   Monocytes Relative 9.2 %   Eosinophils Relative 1.7 %   Basophils Relative 0.6 %  Hemoglobin A1c  Result Value Ref Range   Hgb A1c MFr Bld 5.6 <5.7 % of total Hgb   Mean Plasma Glucose 114 (calc)   eAG (mmol/L) 6.3 (calc)      Assessment & Plan:   Problem List Items Addressed This Visit    Elevated hemoglobin A1c    Significantly improved A1c down to 5.6 from prior 6.0 - improved lifestyle/exercise, wt loss Concern with obesity, HTN, HLD  Plan:  1. Not on any therapy currently  - remain off 2. Encourage improved lifestyle - low carb, low sugar diet, reduce portion size, continue improving regular exercise now with improved knee 3. Follow-up 6 months PreDM A1c      Elevated LDL cholesterol level    Controlled cholesterol on statin / lifestyle Last lipid panel 12/2017 Calculated ASCVD 10 yr risk score 22.5% as smoker, if quit down to 12%  Plan: 1. Continue current meds - Rosuvastatin 5mg  daily 2. Continue ASA 81mg  for primary ASCVD risk reduction 3. Encourage improved lifestyle - low carb/cholesterol, reduce portion size, continue improving regular exercise 4. Follow-up yearly lipid       Environmental and seasonal allergies   Essential hypertension    Controlled HTN - Home BP readings reported normal No known complications  Plan:  1. Continue current BP regimen - HCTZ 12.5mg  daily  2. Encourage improved lifestyle - low sodium diet, regular exercise - need to return to ortho for knee 3. Continue monitor BP outside office, bring readings to next visit, if persistently >140/90 or new symptoms notify office sooner 4. Follow-up 6 months      Morbid obesity (HCC)    Improved wt loss down to BMI >35, still with co morbid conditions HLD, HTN, GERD, OA  Encourage continue improving diet / lifestyle plan, improved activity now w/  improved Knee pain      Tobacco abuse    Ready to quit Active smoker, approx 0.25 to 0.5ppd now, taper down Not tried medicines, tried NRT  Plan: 1. Start Varenicline (Chantix) Day 1-3: 0.5mg  once daily WITH FOOD, Days 4-7: increase to 0.5mg  BID, then maintenance dose after 1 week: 1mg  BID for up to 12 weeks - Set quit date 1 week after start of medication, alternatively if unable to quit, then set goal reduce by 50% or more by 4 weeks, then another 50% reduction in 4 more weeks, and lastly quit after final 4 weeks (total 12 week therapy) - Counseling on side effects primarily nausea (take with food), headaches, insomnia, vivid dreams, mood instability, seizure, rare risk of suicidal ideation, if any serious agitation or acute depression severe mood changes need to stop med  Discussion today >5 minutes (<10 minutes) specifically on counseling on risks of tobacco use, complications, treatment, smoking cessation.       Relevant Medications   varenicline (CHANTIX PAK) 0.5 MG X 11 & 1 MG X 42 tablet    Other Visit Diagnoses    Annual physical exam    -  Primary  Needs flu shot       Relevant Orders   Flu vaccine HIGH DOSE PF (Completed)      Updated Health Maintenance information - Flu shot today - Declined pneumonia vaccine - Will send message to Burgess Estelle for coordinating Low Dose Lung CT for lung ca screening if interested Reviewed recent lab results with patient Encouraged improvement to lifestyle with diet and exercise - Goal of weight loss   Meds ordered this encounter  Medications  . varenicline (CHANTIX PAK) 0.5 MG X 11 & 1 MG X 42 tablet    Sig: Take one 0.5 mg tab by mouth once daily for 3 days, increase to one 0.5 mg twice daily for 4 days, then increase to one 1 mg twice daily.    Dispense:  53 tablet    Refill:  0    Follow up plan: Return in about 6 months (around 07/17/2018) for 6 month follow-up PreDM A1c, HTN, Wt, Smoking(quit?).  Nobie Putnam,  [facility] 01/16/2018, 12:58 PM

## 2018-01-16 NOTE — Assessment & Plan Note (Signed)
Significantly improved A1c down to 5.6 from prior 6.0 - improved lifestyle/exercise, wt loss Concern with obesity, HTN, HLD  Plan:  1. Not on any therapy currently  - remain off 2. Encourage improved lifestyle - low carb, low sugar diet, reduce portion size, continue improving regular exercise now with improved knee 3. Follow-up 6 months PreDM A1c

## 2018-01-16 NOTE — Assessment & Plan Note (Signed)
Controlled HTN - Home BP readings reported normal No known complications  Plan:  1. Continue current BP regimen - HCTZ 12.5mg  daily  2. Encourage improved lifestyle - low sodium diet, regular exercise - need to return to ortho for knee 3. Continue monitor BP outside office, bring readings to next visit, if persistently >140/90 or new symptoms notify office sooner 4. Follow-up 6 months

## 2018-01-17 ENCOUNTER — Telehealth: Payer: Self-pay | Admitting: *Deleted

## 2018-01-17 DIAGNOSIS — Z87891 Personal history of nicotine dependence: Secondary | ICD-10-CM

## 2018-01-17 DIAGNOSIS — Z122 Encounter for screening for malignant neoplasm of respiratory organs: Secondary | ICD-10-CM

## 2018-01-17 NOTE — Telephone Encounter (Signed)
Received referral for initial lung cancer screening scan. Contacted patient and obtained smoking history,(current, 51 pack year) as well as answering questions related to screening process. Patient denies signs of lung cancer such as weight loss or hemoptysis. Patient denies comorbidity that would prevent curative treatment if lung cancer were found. Patient is scheduled for shared decision making visit and CT scan on 02/04/18 at 930am.

## 2018-02-04 ENCOUNTER — Inpatient Hospital Stay: Payer: Medicare Other | Admitting: Nurse Practitioner

## 2018-02-04 ENCOUNTER — Ambulatory Visit: Payer: Medicare Other

## 2018-02-19 ENCOUNTER — Encounter: Payer: Self-pay | Admitting: Nurse Practitioner

## 2018-02-19 ENCOUNTER — Inpatient Hospital Stay: Payer: Medicare Other | Attending: Nurse Practitioner | Admitting: Nurse Practitioner

## 2018-02-19 ENCOUNTER — Telehealth: Payer: Self-pay | Admitting: *Deleted

## 2018-02-19 ENCOUNTER — Ambulatory Visit
Admission: RE | Admit: 2018-02-19 | Discharge: 2018-02-19 | Disposition: A | Discharge status: Home / Self Care | Payer: Medicare Other | Source: Ambulatory Visit | Attending: Nurse Practitioner | Admitting: Nurse Practitioner

## 2018-02-19 DIAGNOSIS — Z87891 Personal history of nicotine dependence: Secondary | ICD-10-CM

## 2018-02-19 DIAGNOSIS — Z720 Tobacco use: Secondary | ICD-10-CM

## 2018-02-19 DIAGNOSIS — Z122 Encounter for screening for malignant neoplasm of respiratory organs: Secondary | ICD-10-CM | POA: Diagnosis not present

## 2018-02-19 DIAGNOSIS — F17210 Nicotine dependence, cigarettes, uncomplicated: Secondary | ICD-10-CM | POA: Diagnosis not present

## 2018-02-19 NOTE — Progress Notes (Signed)
In accordance with CMS guidelines, patient has met eligibility criteria including age, absence of signs or symptoms of lung cancer.  Social History   Tobacco Use  . Smoking status: Current Every Day Smoker    Packs/day: 1.00    Years: 51.00    Pack years: 51.00    Types: Cigarettes  . Smokeless tobacco: Current User  . Tobacco comment: Quit once for 2 years.  Substance Use Topics  . Alcohol use: Yes    Comment: 1-3 drinks every other week.  . Drug use: No      A shared decision-making session was conducted prior to the performance of CT scan. This includes one or more decision aids, includes benefits and harms of screening, follow-up diagnostic testing, over-diagnosis, false positive rate, and total radiation exposure.   Counseling on the importance of adherence to annual lung cancer LDCT screening, impact of co-morbidities, and ability or willingness to undergo diagnosis and treatment is imperative for compliance of the program.   Counseling on the importance of continued smoking cessation for former smokers; the importance of smoking cessation for current smokers, and information about tobacco cessation interventions have been given to patient including [place] and 1800 quit [place] programs.   Written order for lung cancer screening with LDCT has been given to the patient and any and all questions have been answered to the best of my abilities.    Yearly follow up will be coordinated by Burgess Estelle, Thoracic Navigator.  Beckey Rutter, DNP, AGNP-C [place] at [facility] [phone] (work cell) [phone] (office)

## 2018-02-19 NOTE — Telephone Encounter (Signed)
Called report  IMPRESSION: Lung-RADS 4B, suspicious. Additional imaging evaluation or consultation with Pulmonology or Thoracic Surgery recommended.  22.4 mm irregular nodule in the lateral right upper lobe, abutting/involving the right lateral chest wall, suspicious for primary bronchogenic neoplasm. Additional 13.7 mm nodule in the central right upper lobe. Discussion at multidisciplinary tumor board is suggested. Consider PET-CT for further evaluation, as clinically warranted.  Aortic Atherosclerosis (ICD10-I70.0) and Emphysema (ICD10-J43.9).  These results will be called to the ordering clinician or representative by the Radiologist Assistant, and communication documented in the PACS or zVision Dashboard.   Electronically Signed   By: Julian Hy M.D.   On: 02/19/2018 12:40

## 2018-02-20 ENCOUNTER — Encounter: Payer: Self-pay | Admitting: *Deleted

## 2018-02-20 ENCOUNTER — Telehealth: Payer: Self-pay | Admitting: *Deleted

## 2018-02-20 DIAGNOSIS — R911 Solitary pulmonary nodule: Secondary | ICD-10-CM

## 2018-02-20 NOTE — Telephone Encounter (Signed)
Patient and PCP have been notified of lung screening results, and are in agreement with plan to proceed with PET scan and PFT's. After that patient will be discussed in multidisciplinary thoracic conference and will see appropriate provider to discuss results depending on outcome of PET and PFT's.  "IMPRESSION: Lung-RADS 4B, suspicious. Additional imaging evaluation or consultation with Pulmonology or Thoracic Surgery recommended.  22.4 mm irregular nodule in the lateral right upper lobe, abutting/involving the right lateral chest wall, suspicious for primary bronchogenic neoplasm. Additional 13.7 mm nodule in the central right upper lobe. Discussion at multidisciplinary tumor board is suggested. Consider PET-CT for further evaluation, as clinically warranted.  Aortic Atherosclerosis (ICD10-I70.0) and Emphysema (ICD10-J43.9).  These results will be called to the ordering clinician or representative by the Radiologist Assistant, and communication documented in the PACS or zVision Dashboard.

## 2018-02-21 ENCOUNTER — Encounter: Payer: Self-pay | Admitting: Nurse Practitioner

## 2018-02-21 NOTE — Telephone Encounter (Signed)
Pt has been scheduled for all recommended appts and notified. Appts have been mailed to patient as well.

## 2018-02-21 NOTE — Progress Notes (Signed)
   [place]  Telephone:([phone] [phone]  Patient Care Team: Olin Hauser, DO as PCP - General (Family Medicine) Marry Guan Laurice Record, MD as Consulting Physician (Orthopedic Surgery)   Name of the patient: Joanna Morris  [id number]  24-Dec-1950   [name], 67 y.o. female who was referred to the lung cancer screening program/low-dose CT for current use of tobacco products.  she had a low-dose chest CT on 02/19/18 which was reported as lung RADS 4B, suspicious based on 22.4 mm irregular nodule in the lateral right upper lobe and additional 13.7 mm nodule in the central right upper lobe.  These findings concerning for primary bronchogenic neoplasm.  she will require PET scan and PFTs to further evaluate and assist in medical decision-making including possible biopsy and to optimize management. Patient has been referred to [place], RN coordinating care.   Beckey Rutter, DNP, AGNP-C [place] at [facility] [phone] (work cell) [phone] (office)

## 2018-02-21 NOTE — Addendum Note (Signed)
Addended by: Telford Nab on: 02/21/2018 11:55 AM   Modules accepted: Orders

## 2018-02-21 NOTE — Progress Notes (Signed)
  Oncology Nurse Navigator Documentation  Navigator Location: [facility]-Med Onc (02/21/18 1100) Referral date to RadOnc/MedOnc: 02/19/18 (02/21/18 1100) )Navigator Encounter Type: Introductory phone call (02/21/18 1100)   Abnormal Finding Date: 02/19/18 (02/21/18 1100)                   Treatment Phase: Abnormal Scans (02/21/18 1100) Barriers/Navigation Needs: Coordination of Care (02/21/18 1100)   Interventions: Coordination of Care (02/21/18 1100)   Coordination of Care: Appts;Radiology (02/21/18 1100)        Acuity: Level 2 (02/21/18 1100)   Acuity Level 2: Initial guidance, education and coordination as needed;Educational needs;Assistance expediting appointments (02/21/18 1100)  phone call made to patient to introduce to navigator services. Reviewed upcoming appts with patient and informed will be mailed to her as well. Contact info given and instructed to call with any further questions or needs. Pt verbalized understanding.   Time Spent with Patient: 30 (02/21/18 1100)

## 2018-03-07 ENCOUNTER — Encounter
Admission: RE | Admit: 2018-03-07 | Discharge: 2018-03-07 | Disposition: A | Discharge status: Home / Self Care | Payer: Medicare Other | Source: Ambulatory Visit | Attending: Nurse Practitioner | Admitting: Nurse Practitioner

## 2018-03-07 DIAGNOSIS — R918 Other nonspecific abnormal finding of lung field: Secondary | ICD-10-CM | POA: Diagnosis not present

## 2018-03-07 DIAGNOSIS — R911 Solitary pulmonary nodule: Secondary | ICD-10-CM | POA: Insufficient documentation

## 2018-03-07 LAB — GLUCOSE, CAPILLARY: Glucose-Capillary: 104 mg/dL — ABNORMAL HIGH (ref 70–99)

## 2018-03-07 MED ORDER — FLUDEOXYGLUCOSE F - 18 (FDG) INJECTION
10.1000 | Freq: Once | INTRAVENOUS | Status: AC | PRN
  Administered 2018-03-07: 10.51 via INTRAVENOUS

## 2018-03-08 ENCOUNTER — Ambulatory Visit: Payer: Self-pay | Admitting: Internal Medicine

## 2018-03-12 ENCOUNTER — Ambulatory Visit: Payer: Medicare Other | Attending: Nurse Practitioner

## 2018-03-12 DIAGNOSIS — R911 Solitary pulmonary nodule: Secondary | ICD-10-CM

## 2018-03-12 LAB — BLOOD GAS, ARTERIAL
Acid-Base Excess: 5.8 mmol/L — ABNORMAL HIGH (ref 0.0–2.0)
Bicarbonate: 31.2 mmol/L — ABNORMAL HIGH (ref 20.0–28.0)
FIO2: 0.21
O2 SAT: 94.5 %
PO2 ART: 71 mmHg — AB (ref 83.0–108.0)
Patient temperature: 37
pCO2 arterial: 47 mmHg (ref 32.0–48.0)
pH, Arterial: 7.43 (ref 7.350–7.450)

## 2018-03-13 ENCOUNTER — Ambulatory Visit (INDEPENDENT_AMBULATORY_CARE_PROVIDER_SITE_OTHER): Payer: Medicare Other

## 2018-03-13 VITALS — BP 136/92 | HR 76 | Resp 16 | Ht 62.0 in | Wt 191.8 lb

## 2018-03-13 DIAGNOSIS — Z0000 Encounter for general adult medical examination without abnormal findings: Secondary | ICD-10-CM

## 2018-03-13 NOTE — Progress Notes (Signed)
Subjective:   Joanna Morris is a 68 y.o. female who presents for Medicare Annual (Subsequent) preventive examination.  Review of Systems:   Cardiac Risk Factors include: advanced age (>72men, >32 women);dyslipidemia;hypertension;obesity (BMI >30kg/m2);smoking/ tobacco exposure     Objective:     Vitals: BP (!) 136/92 (BP Location: Right Arm, Patient Position: Sitting, Cuff Size: Normal)   Pulse 76   Resp 16   Ht 5\' 2"  (1.575 m)   Wt 191 lb 12.8 oz (87 kg)   BMI 35.08 kg/m   Body mass index is 35.08 kg/m.  Advanced Directives 03/13/2018 12/05/2016 05/22/2016 05/15/2016 11/20/2015  Does Patient Have a Medical Advance Directive? No No No No;Yes No  Type of Advance Directive - - - [place] in Chart? - - - No - copy requested -  Would patient like information on creating a medical advance directive? No - Patient declined Yes (MAU/Ambulatory/Procedural Areas - Information given) - - No - patient declined information    Tobacco Social History   Tobacco Use  Smoking Status Current Every Day Smoker  . Packs/day: 1.00  . Years: 51.00  . Pack years: 51.00  . Types: Cigarettes  Smokeless Tobacco Current User  Tobacco Comment   Quit once for 2 years. pt has rx for chantix     Ready to quit: Yes Counseling given: Not Answered Comment: Quit once for 2 years. pt has rx for chantix   Clinical Intake:  Pre-visit preparation completed: Yes  Pain : No/denies pain     Nutritional Status: BMI > 30  Obese Nutritional Risks: None Diabetes: No  How often do you need to have someone help you when you read instructions, pamphlets, or other written materials from your doctor or pharmacy?: 1 - Never What is the last grade level you completed in school?: high school graduate  Interpreter Needed?: No  Information entered by :: Clemetine Marker LPN  Past Medical History:  Diagnosis Date  . Allergy   . Cataract   . Genital  warts   . GERD (gastroesophageal reflux disease)    H/O  . History of kidney stones   . Hyperlipidemia   . Hypertension   . Lung nodule   . Tobacco dependency    Past Surgical History:  Procedure Laterality Date  . ABDOMINAL HYSTERECTOMY    . CESAREAN SECTION     X2  . KNEE ARTHROSCOPY WITH MEDIAL MENISECTOMY Right 05/22/2016   Procedure: KNEE ARTHROSCOPY WITH PARTIAL MEDIAL MENISECTOMY;  Surgeon: Dereck Leep, MD;  Location: [facility] ORS;  Service: Orthopedics;  Laterality: Right;  . OOPHORECTOMY     Family History  Problem Relation Age of Onset  . Heart disease Mother 43       MI  . Heart attack Mother 34  . Diabetes Mother   . Pancreatic cancer Sister   . Ovarian cancer Sister   . Breast cancer Neg Hx    Social History   Socioeconomic History  . Marital status: Divorced    Spouse name: Not on file  . Number of children: 2  . Years of education: [employer]  . Highest education level: Not on file  Occupational History  . Occupation: Retired  Scientific laboratory technician  . Financial resource strain: Not very hard  . Food insecurity:    Worry: Sometimes true    Inability: Sometimes true  . Transportation needs:    Medical: No    Non-medical: No  Tobacco Use  . Smoking status: Current Every Day Smoker    Packs/day: 1.00    Years: 51.00    Pack years: 51.00    Types: Cigarettes  . Smokeless tobacco: Current User  . Tobacco comment: Quit once for 2 years. pt has rx for chantix  Substance and Sexual Activity  . Alcohol use: Yes    Comment: 1-3 drinks every other week.  . Drug use: No  . Sexual activity: Not Currently    Birth control/protection: Surgical  Lifestyle  . Physical activity:    Days per week: 2 days    Minutes per session: 30 min  . Stress: Not at all  Relationships  . Social connections:    Talks on phone: More than three times a week    Gets together: More than three times a week    Attends religious service: More than 4 times per year    Active member of  club or organization: No    Attends meetings of clubs or organizations: Never    Relationship status: Divorced  Other Topics Concern  . Not on file  Social History Narrative  . Not on file    Outpatient Encounter Medications as of 03/13/2018  Medication Sig  . aspirin EC 81 MG tablet Take 1 tablet (81 mg total) by mouth daily.  . calcium carbonate (TUMS - DOSED IN MG ELEMENTAL CALCIUM) 500 MG chewable tablet Chew 1 tablet by mouth daily.  . Cholecalciferol (VITAMIN D3) 1000 units CAPS Take 1,000 Units by mouth daily.  . hydrochlorothiazide (MICROZIDE) 12.5 MG capsule Take 1 capsule (12.5 mg total) by mouth daily.  Marland Kitchen ketotifen (ZADITOR) 0.025 % ophthalmic solution Place 1-2 drops into both eyes 2 (two) times daily as needed (for itchy eyes).  Marland Kitchen loratadine (CLARITIN) 10 MG tablet Take 1 tablet (10 mg total) by mouth daily. Use for 4-6 weeks then stop, and use as needed or seasonally  . rosuvastatin (CRESTOR) 5 MG tablet Take 1 tablet (5 mg total) by mouth at bedtime.  . fluticasone (FLONASE) 50 MCG/ACT nasal spray Place 2 sprays into both nostrils daily. Use for 4-6 weeks then stop and use seasonally or as needed. (Patient not taking: Reported on 03/13/2018)  . varenicline (CHANTIX PAK) 0.5 MG X 11 & 1 MG X 42 tablet Take one 0.5 mg tab by mouth once daily for 3 days, increase to one 0.5 mg twice daily for 4 days, then increase to one 1 mg twice daily. (Patient not taking: Reported on 03/13/2018)   No facility-administered encounter medications on file as of 03/13/2018.     Activities of Daily Living In your present state of health, do you have any difficulty performing the following activities: 03/13/2018 01/04/2018  Hearing? N N  Comment declines hearing aids -  Vision? N N  Comment wears reading glasses -  Difficulty concentrating or making decisions? N N  Walking or climbing stairs? N N  Dressing or bathing? N N  Doing errands, shopping? N N  Preparing Food and eating ? N -  Using the  Toilet? N -  In the past six months, have you accidently leaked urine? N -  Do you have problems with loss of bowel control? N -  Managing your Medications? N -  Managing your Finances? N -  Housekeeping or managing your Housekeeping? N -  Some recent data might be hidden    Patient Care Team: Olin Hauser, DO as PCP - General (Family Medicine) Skip Estimable  Mamie Nick, MD as Consulting Physician (Orthopedic Surgery) Telford Nab, RN as Registered Nurse    Assessment:   This is a routine wellness examination for [place].  Exercise Activities and Dietary recommendations Current Exercise Habits: Home exercise routine, Type of exercise: walking, Time (Minutes): 30, Frequency (Times/Week): 2, Weekly Exercise (Minutes/Week): 60, Intensity: Mild, Exercise limited by: None identified  Goals    . Exercise 150 minutes per week (moderate activity)     Recommend gradually increasing distance walked from 1 mile to 2 miles    . Quit Smoking     Pt would like to stop smoking completely.       Fall Risk Fall Risk  03/13/2018 01/16/2018 10/02/2017 06/28/2017 12/27/2016  Falls in the past year? 0 0 No No No  Number falls in past yr: 0 - - - -  Injury with Fall? - - - - -  Risk for fall due to : - - - - -  Risk for fall due to: Comment - - - - -  Follow up Falls prevention discussed Falls evaluation completed - - -   FALL RISK PREVENTION PERTAINING TO THE HOME:  Any stairs in or around the home WITH handrails? No  Home free of loose throw rugs in walkways, pet beds, electrical cords, etc? Yes  Adequate lighting in your home to reduce risk of falls? Yes   ASSISTIVE DEVICES UTILIZED TO PREVENT FALLS:  Life alert? No  Use of a cane, walker or w/c? No  Grab bars in the bathroom? No  Shower chair or bench in shower? No  Elevated toilet seat or a handicapped toilet? No   DME ORDERS:  DME order needed?  No   TIMED UP AND GO:  Was the test performed? Yes .  Length of time to ambulate  10 feet: 5 sec.   GAIT:  Appearance of gait: Gait stead-fast and without the use of an assistive device.  Education: Fall risk prevention has been discussed.  Intervention(s) required? No   Depression Screen PHQ 2/9 Scores 03/13/2018 01/16/2018 01/04/2018 10/02/2017  PHQ - 2 Score 0 0 0 0     Cognitive Function     6CIT Screen 03/13/2018 12/05/2016  What Year? 0 points 0 points  What month? 0 points 0 points  What time? 0 points 0 points  Count back from 20 0 points 0 points  Months in reverse 0 points 0 points  Repeat phrase 2 points 2 points  Total Score 2 2    Immunization History  Administered Date(s) Administered  . Influenza, High Dose Seasonal PF 12/27/2016, 01/16/2018    Qualifies for Shingles Vaccine? Yes . Due for Shingrix. Education has been provided regarding the importance of this vaccine. Pt has been advised to call insurance company to determine out of pocket expense. Advised may also receive vaccine at local pharmacy or Health Dept. Verbalized acceptance and understanding.  Tdap: Although this vaccine is not a covered service during a Wellness Exam, does the patient still wish to receive this vaccine today?  No .  Education has been provided regarding the importance of this vaccine. Advised may receive this vaccine at local pharmacy or Health Dept. Aware to provide a copy of the vaccination record if obtained from local pharmacy or Health Dept. Verbalized acceptance and understanding.  Flu Vaccine: Up to date   Pneumococcal Vaccine: Due for Pneumococcal vaccine. Does the patient want to receive this vaccine today?  No . Education has been provided regarding the importance of  this vaccine but still declined. Advised may receive this vaccine at local pharmacy or Health Dept. Aware to provide a copy of the vaccination record if obtained from local pharmacy or Health Dept. Verbalized acceptance and understanding.   Screening Tests Health Maintenance  Topic Date Due    . PNA vac Low Risk Adult (1 of 2 - PCV13) 01/16/2021 (Originally 05/16/2013)  . TETANUS/TDAP  12/09/2025 (Originally 05/17/1969)  . MAMMOGRAM  01/10/2020  . COLONOSCOPY  12/09/2025  . INFLUENZA VACCINE  Completed  . DEXA SCAN  Completed  . Hepatitis C Screening  Completed   Cancer Screenings:  Colorectal Screening: Completed 12/10/15. Repeat every 10 years;   Mammogram: Completed 01/09/18. Repeat every year;   Bone Density: Completed 01/08/17. Results reflect OSTEOPENIA, . Repeat every 2 years.   Lung Cancer Screening: (Low Dose CT Chest recommended if Age 43-80 years, 30 pack-year currently smoking OR have quit w/in 15years.) does qualify. Lung CT completed 02/2018.  Additional Screening:  Hepatitis C Screening: does qualify; Completed 2012  Vision Screening: Recommended annual ophthalmology exams for early detection of glaucoma and other disorders of the eye. Is the patient up to date with their annual eye exam?  Yes  Who is the provider or what is the name of the office in which the pt attends annual eye exams? [place] Screening: Recommended annual dental exams for proper oral hygiene  Community Resource Referral:  CRR required this visit?  No      Plan:     I have personally reviewed and addressed the Medicare Annual Wellness questionnaire and have noted the following in the patient's chart:  A. Medical and social history B. Use of alcohol, tobacco or illicit drugs  C. Current medications and supplements D. Functional ability and status E.  Nutritional status F.  Physical activity G. Advance directives H. List of other physicians I.  Hospitalizations, surgeries, and ER visits in previous 12 months J.  [place] such as hearing and vision if needed, cognitive and depression L. Referrals and appointments   In addition, I have reviewed and discussed with patient certain preventive protocols, quality metrics, and best practice  recommendations. A written personalized care plan for preventive services as well as general preventive health recommendations were provided to patient.   Signed,  Clemetine Marker, LPN Nurse Health Advisor   Nurse Notes:Pt recently had lung CT that showed a nodule she is currently undergoing testing for and she is somewhat anxious about this but plans to stop smoking using chantix which she is starting next week. Pt also states she has applied for [place] housing due to rent going up at her current apartment and her food stamps are no long eligible so she occaisonally goes to food banks. Community resources offered and patient declined at this time.

## 2018-03-13 NOTE — Patient Instructions (Signed)
Joanna Morris , Thank you for taking time to come for your Medicare Wellness Visit. I appreciate your ongoing commitment to your health goals. Please review the following plan we discussed and let me know if I can assist you in the future.   Screening recommendations/referrals: Colonoscopy: done 12/10/15 repeat in 2027 Mammogram: done 01/09/18 Bone Density: done 01/08/17 Recommended yearly ophthalmology/optometry visit for glaucoma screening and checkup Recommended yearly dental visit for hygiene and checkup  Vaccinations: Influenza vaccine: done 01/16/18 Pneumococcal vaccine: postponed Tdap vaccine: due, please contact us if you get a cut or scrape Shingles vaccine: Shingrix discussed. Please contact your pharmacy for coverage information.     Advanced directives: Advance directive discussed with you today. Even though you declined this today please call our office should you change your mind and we can give you the proper paperwork for you to fill out.  Conditions/risks identified: If you wish to quit smoking, help is available. For free tobacco cessation program offerings call the [facility] at [phone] or Live Well Line at 367 259 8824. You may also visit [email or web address] or email [email or web address] for more information on other programs.    Next appointment: Please follow up in one year for your Medicare Annual Wellness visit.     Preventive Care 31 Years and Older, Female Preventive care refers to lifestyle choices and visits with your health care provider that can promote health and wellness. What does preventive care include?  A yearly physical exam. This is also called an annual well check.  Dental exams once or twice a year.  Routine eye exams. Ask your health care provider how often you should have your eyes checked.  Personal lifestyle choices, including:  Daily care of your teeth and gums.  Regular physical activity.  Eating a healthy  diet.  Avoiding tobacco and drug use.  Limiting alcohol use.  Practicing safe sex.  Taking low-dose aspirin every day.  Taking vitamin and mineral supplements as recommended by your health care provider. What happens during an annual well check? The services and screenings done by your health care provider during your annual well check will depend on your age, overall health, lifestyle risk factors, and family history of disease. Counseling  Your health care provider may ask you questions about your:  Alcohol use.  Tobacco use.  Drug use.  Emotional well-being.  Home and relationship well-being.  Sexual activity.  Eating habits.  History of falls.  Memory and ability to understand (cognition).  Work and work Statistician.  Reproductive health. Screening  You may have the following tests or measurements:  Height, weight, and BMI.  Blood pressure.  Lipid and cholesterol levels. These may be checked every 5 years, or more frequently if you are over 71 years old.  Skin check.  Lung cancer screening. You may have this screening every year starting at age 58 if you have a 30-pack-year history of smoking and currently smoke or have quit within the past 15 years.  Fecal occult blood test (FOBT) of the stool. You may have this test every year starting at age 39.  Flexible sigmoidoscopy or colonoscopy. You may have a sigmoidoscopy every 5 years or a colonoscopy every 10 years starting at age 53.  Hepatitis C blood test.  Hepatitis B blood test.  Sexually transmitted disease (STD) testing.  Diabetes screening. This is done by checking your blood sugar (glucose) after you have not eaten for a while (fasting). You may have this done every 1-3 years.  Bone density scan. This is done to screen for osteoporosis. You may have this done starting at age 49.  Mammogram. This may be done every 1-2 years. Talk to your health care provider about how often you should have  regular mammograms. Talk with your health care provider about your test results, treatment options, and if necessary, the need for more tests. Vaccines  Your health care provider may recommend certain vaccines, such as:  Influenza vaccine. This is recommended every year.  Tetanus, diphtheria, and acellular pertussis (Tdap, Td) vaccine. You may need a Td booster every 10 years.  Zoster vaccine. You may need this after age 27.  Pneumococcal 13-valent conjugate (PCV13) vaccine. One dose is recommended after age 15.  Pneumococcal polysaccharide (PPSV23) vaccine. One dose is recommended after age 74. Talk to your health care provider about which screenings and vaccines you need and how often you need them. This information is not intended to replace advice given to you by your health care provider. Make sure you discuss any questions you have with your health care provider. Document Released: 03/19/2015 Document Revised: 11/10/2015 Document Reviewed: 12/22/2014 Elsevier Interactive Patient Education  2017 [place] Prevention in the Home Falls can cause injuries. They can happen to people of all ages. There are many things you can do to make your home safe and to help prevent falls. What can I do on the outside of my home?  Regularly fix the edges of walkways and driveways and fix any cracks.  Remove anything that might make you trip as you walk through a door, such as a raised step or threshold.  Trim any bushes or trees on the path to your home.  Use bright outdoor lighting.  Clear any walking paths of anything that might make someone trip, such as rocks or tools.  Regularly check to see if handrails are loose or broken. Make sure that both sides of any steps have handrails.  Any raised decks and porches should have guardrails on the edges.  Have any leaves, snow, or ice cleared regularly.  Use sand or salt on walking paths during winter.  Clean up any spills in your  garage right away. This includes oil or grease spills. What can I do in the bathroom?  Use night lights.  Install grab bars by the toilet and in the tub and shower. Do not use towel bars as grab bars.  Use non-skid mats or decals in the tub or shower.  If you need to sit down in the shower, use a plastic, non-slip stool.  Keep the floor dry. Clean up any water that spills on the floor as soon as it happens.  Remove soap buildup in the tub or shower regularly.  Attach bath mats securely with double-sided non-slip rug tape.  Do not have throw rugs and other things on the floor that can make you trip. What can I do in the bedroom?  Use night lights.  Make sure that you have a light by your bed that is easy to reach.  Do not use any sheets or blankets that are too big for your bed. They should not hang down onto the floor.  Have a firm chair that has side arms. You can use this for support while you get dressed.  Do not have throw rugs and other things on the floor that can make you trip. What can I do in the kitchen?  Clean up any spills right away.  Avoid walking  on wet floors.  Keep items that you use a lot in easy-to-reach places.  If you need to reach something above you, use a strong step stool that has a grab bar.  Keep electrical cords out of the way.  Do not use floor polish or wax that makes floors slippery. If you must use wax, use non-skid floor wax.  Do not have throw rugs and other things on the floor that can make you trip. What can I do with my stairs?  Do not leave any items on the stairs.  Make sure that there are handrails on both sides of the stairs and use them. Fix handrails that are broken or loose. Make sure that handrails are as long as the stairways.  Check any carpeting to make sure that it is firmly attached to the stairs. Fix any carpet that is loose or worn.  Avoid having throw rugs at the top or bottom of the stairs. If you do have throw  rugs, attach them to the floor with carpet tape.  Make sure that you have a light switch at the top of the stairs and the bottom of the stairs. If you do not have them, ask someone to add them for you. What else can I do to help prevent falls?  Wear shoes that:  Do not have high heels.  Have rubber bottoms.  Are comfortable and fit you well.  Are closed at the toe. Do not wear sandals.  If you use a stepladder:  Make sure that it is fully opened. Do not climb a closed stepladder.  Make sure that both sides of the stepladder are locked into place.  Ask someone to hold it for you, if possible.  Clearly mark and make sure that you can see:  Any grab bars or handrails.  First and last steps.  Where the edge of each step is.  Use tools that help you move around (mobility aids) if they are needed. These include:  Canes.  Walkers.  Scooters.  Crutches.  Turn on the lights when you go into a dark area. Replace any light bulbs as soon as they burn out.  Set up your furniture so you have a clear path. Avoid moving your furniture around.  If any of your floors are uneven, fix them.  If there are any pets around you, be aware of where they are.  Review your medicines with your doctor. Some medicines can make you feel dizzy. This can increase your chance of falling. Ask your doctor what other things that you can do to help prevent falls. This information is not intended to replace advice given to you by your health care provider. Make sure you discuss any questions you have with your health care provider. Document Released: 12/17/2008 Document Revised: 07/29/2015 Document Reviewed: 03/27/2014 Elsevier Interactive Patient Education  2017 [employer].

## 2018-03-14 ENCOUNTER — Telehealth: Payer: Self-pay | Admitting: Nurse Practitioner

## 2018-03-14 ENCOUNTER — Other Ambulatory Visit: Payer: Self-pay

## 2018-03-14 NOTE — Telephone Encounter (Signed)
Encounter created in error

## 2018-03-14 NOTE — Progress Notes (Signed)
Tumor Board Documentation  Joanna Morris was presented by Dr Janese Banks at our Tumor Board on 03/14/2018, which included representatives from medical oncology, radiation oncology, surgical oncology, internal medicine, navigation, pathology, surgical, radiology, research, palliative care, pulmonology.  Joanna Morris currently presents for discussion, for [place] with history of the following treatments: none.  Additionally, we reviewed previous medical and familial history, history of present illness, and recent lab results along with all available histopathologic and imaging studies. The tumor board considered available treatment options and made the following recommendations:   Refer to Med onc, seeing Dr Janese Banks 03/15/18 adn to Dr Genevive Bi for surgical evaluation   The following procedures/referrals were also placed: No orders of the defined types were placed in this encounter.   Clinical Trial Status:     Staging used:    National site-specific guidelines   were discussed with respect to the case.  Tumor board is a meeting of clinicians from various specialty areas who evaluate and discuss patients for whom a multidisciplinary approach is being considered. Final determinations in the plan of care are those of the provider(s). The responsibility for follow up of recommendations given during tumor board is that of the provider.   Today's extended care, comprehensive team conference, Joanna Morris was not present for the discussion and was not examined.   Multidisciplinary Tumor Board is a multidisciplinary case peer review process.  Decisions discussed in the Multidisciplinary Tumor Board reflect the opinions of the specialists present at the conference without having examined the patient.  Ultimately, treatment and diagnostic decisions rest with the primary provider(s) and the patient.

## 2018-03-15 ENCOUNTER — Encounter: Payer: Self-pay | Admitting: Oncology

## 2018-03-15 ENCOUNTER — Other Ambulatory Visit: Payer: Self-pay

## 2018-03-15 ENCOUNTER — Other Ambulatory Visit: Payer: Self-pay | Admitting: Cardiothoracic Surgery

## 2018-03-15 ENCOUNTER — Ambulatory Visit (INDEPENDENT_AMBULATORY_CARE_PROVIDER_SITE_OTHER): Payer: Medicare Other | Admitting: Cardiothoracic Surgery

## 2018-03-15 ENCOUNTER — Inpatient Hospital Stay: Payer: Medicare Other | Attending: Oncology | Admitting: Oncology

## 2018-03-15 ENCOUNTER — Encounter: Payer: Self-pay | Admitting: Cardiothoracic Surgery

## 2018-03-15 ENCOUNTER — Telehealth: Payer: Self-pay

## 2018-03-15 ENCOUNTER — Encounter: Payer: Self-pay | Admitting: *Deleted

## 2018-03-15 VITALS — BP 162/91 | HR 76 | Temp 97.3°F | Ht 61.0 in | Wt 193.2 lb

## 2018-03-15 VITALS — BP 130/88 | HR 69 | Temp 97.2°F | Resp 18 | Ht 61.02 in | Wt 193.6 lb

## 2018-03-15 DIAGNOSIS — R05 Cough: Secondary | ICD-10-CM

## 2018-03-15 DIAGNOSIS — Z8041 Family history of malignant neoplasm of ovary: Secondary | ICD-10-CM | POA: Diagnosis not present

## 2018-03-15 DIAGNOSIS — K6389 Other specified diseases of intestine: Secondary | ICD-10-CM | POA: Diagnosis not present

## 2018-03-15 DIAGNOSIS — R918 Other nonspecific abnormal finding of lung field: Secondary | ICD-10-CM

## 2018-03-15 DIAGNOSIS — Z720 Tobacco use: Secondary | ICD-10-CM | POA: Diagnosis not present

## 2018-03-15 DIAGNOSIS — R948 Abnormal results of function studies of other organs and systems: Secondary | ICD-10-CM

## 2018-03-15 DIAGNOSIS — Z800 Family history of malignant neoplasm of digestive organs: Secondary | ICD-10-CM | POA: Insufficient documentation

## 2018-03-15 NOTE — Telephone Encounter (Signed)
Patient notified of referral to [facility] Dr.Byrnette for Colonoscopy. Let patient know she should receive a call from our office within a week and if not then call us so we can check on this for her.

## 2018-03-15 NOTE — Patient Instructions (Addendum)
Patient is to scheduled for a colonoscopy. She will call the office next week to speak with Dr.Oaks to discuss if she will have surgery or radiation.  Call the office with any questions or concerns.

## 2018-03-15 NOTE — Progress Notes (Signed)
New patient here for Lung cancer. Chronic intermittent cough. Appetite is good. Energy is normal. Denies pain. No weight loss.

## 2018-03-15 NOTE — H&P (View-Only) (Signed)
Patient ID: Joanna Morris, female   DOB: 09/18/1950, 68 y.o.   MRN: [id number]  Chief Complaint  Patient presents with  . New Patient (Initial Visit)     New patient-Lung mass-PET done on 03/07/18 and PFT done on 03/12/18-appt with Dr Janese Banks at [facility]. seeing [place] after Janese Banks.    Referred By Dr. Parks Ranger Reason for Referral right upper lobe mass  HPI Location, Quality, Duration, Severity, Timing, Context, Modifying Factors, Associated Signs and Symptoms.  Joanna Morris is a 68 y.o. female.  She is a lifelong smoker and currently smokes about a pack of cigarettes every day or every other day.  She states that she had a lung cancer screening CT scan performed back in December which was abnormal.  This led to a subsequent referral for a PET scan which was markedly positive in the right upper lobe.  She had 2 lesions identified.  One was in the upper lobe and was spiculated the other in the upper lobe on the right was non-spiculated and was PET negative.  She had some pulmonary function studies done which revealed an FEV1 and DLCO of about 80% and she was then referred to me.  Overall I found her to be a somewhat poor historian with little insight into the process that she had.  She states that she currently smokes and has done so all of her life.  She did not appear to be interested in smoking cessation.   Past Medical History:  Diagnosis Date  . Allergy   . Cataract   . Genital warts   . GERD (gastroesophageal reflux disease)    H/O  . History of kidney stones   . Hyperlipidemia   . Hypertension   . Lung nodule   . Tobacco dependency     Past Surgical History:  Procedure Laterality Date  . ABDOMINAL HYSTERECTOMY    . CESAREAN SECTION     X2  . KNEE ARTHROSCOPY WITH MEDIAL MENISECTOMY Right 05/22/2016   Procedure: KNEE ARTHROSCOPY WITH PARTIAL MEDIAL MENISECTOMY;  Surgeon: Dereck Leep, MD;  Location: [facility] ORS;  Service: Orthopedics;  Laterality: Right;  . OOPHORECTOMY       Family History  Problem Relation Age of Onset  . Heart disease Mother 46       MI  . Heart attack Mother 30  . Diabetes Mother   . Pancreatic cancer Sister   . Ovarian cancer Sister   . Breast cancer Neg Hx     Social History Social History   Tobacco Use  . Smoking status: Current Every Day Smoker    Packs/day: 1.00    Years: 51.00    Pack years: 51.00    Types: Cigarettes  . Smokeless tobacco: Never Used  . Tobacco comment: Quit once for 2 years. pt has rx for chantix  Substance Use Topics  . Alcohol use: Yes    Comment: 1-3 drinks every other week.  . Drug use: No    Allergies  Allergen Reactions  . Tramadol Rash    Current Outpatient Medications  Medication Sig Dispense Refill  . aspirin EC 81 MG tablet Take 1 tablet (81 mg total) by mouth daily.    . calcium carbonate (TUMS - DOSED IN MG ELEMENTAL CALCIUM) 500 MG chewable tablet Chew 1 tablet by mouth daily.    . Cholecalciferol (VITAMIN D3) 1000 units CAPS Take 1,000 Units by mouth daily.    . fluticasone (FLONASE) 50 MCG/ACT nasal spray Place 2 sprays into both  nostrils daily. Use for 4-6 weeks then stop and use seasonally or as needed. 16 g 3  . hydrochlorothiazide (MICROZIDE) 12.5 MG capsule Take 1 capsule (12.5 mg total) by mouth daily. 90 capsule 1  . ketotifen (ZADITOR) 0.025 % ophthalmic solution Place 1-2 drops into both eyes 2 (two) times daily as needed (for itchy eyes).    Marland Kitchen loratadine (CLARITIN) 10 MG tablet Take 1 tablet (10 mg total) by mouth daily. Use for 4-6 weeks then stop, and use as needed or seasonally 30 tablet 11  . rosuvastatin (CRESTOR) 5 MG tablet Take 1 tablet (5 mg total) by mouth at bedtime. 90 tablet 1  . varenicline (CHANTIX PAK) 0.5 MG X 11 & 1 MG X 42 tablet Take one 0.5 mg tab by mouth once daily for 3 days, increase to one 0.5 mg twice daily for 4 days, then increase to one 1 mg twice daily. 53 tablet 0   No current facility-administered medications for this visit.        Review of Systems A complete review of systems was asked and was negative except for the following positive findings shortness of breath with exertion.  Blood pressure (!) 162/91, pulse 76, temperature (!) 97.3 F (36.3 C), temperature source Temporal, height 5\' 1"  (1.549 m), weight 193 lb 3.2 oz (87.6 kg), SpO2 92 %.  Physical Exam CONSTITUTIONAL:  Pleasant, well-developed, well-nourished, and in no acute distress. EYES: Pupils equal and reactive to light, Sclera non-icteric EARS, NOSE, MOUTH AND THROAT:  The oropharynx was clear.  Dentition is good repair.  She is edentulous on the top.  Oral mucosa pink and moist. LYMPH NODES:  Lymph nodes in the neck and axillae were normal RESPIRATORY:  Lungs were clear.  Normal respiratory effort without pathologic use of accessory muscles of respiration CARDIOVASCULAR: Heart was regular without murmurs.  There were no carotid bruits. GI: The abdomen was soft, nontender, and nondistended. There were no palpable masses. There was no hepatosplenomegaly. There were normal bowel sounds in all quadrants. GU:  Rectal deferred.   MUSCULOSKELETAL:  Normal muscle strength and tone.  No clubbing or cyanosis.   SKIN:  There were no pathologic skin lesions.  There were no nodules on palpation. NEUROLOGIC:  Sensation is normal.  Cranial nerves are grossly intact. PSYCH:  Oriented to person, place and time.  Mood and affect are normal.  Data Reviewed CT scan and PET scan  I have personally reviewed the patient's imaging, laboratory findings and medical records.    Assessment    2 right upper lobe lesions 1 spiculated and PET positive consistent with a malignancy.  The other is rounded and consistent with a benign tumor.    Plan    I had a long discussion with her regarding the options.  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  We discussed the role radiation therapy.  We discussed the role that lung biopsy may play.   I reviewed with her the risks of surgery including risks of bleeding, infection, air leak and death.  We also reviewed the options of radiation therapy.  She was accompanied today by her sister and they would like to think about their options.  They will contact me next week to discuss their findings.  They did not desire a appointment with Dr. Donella Stade today.       Nestor Lewandowsky, MD 03/15/2018, 12:32 PM

## 2018-03-15 NOTE — Progress Notes (Signed)
Patient ID: Joanna Morris, female   DOB: 09/21/1950, 68 y.o.   MRN: [id number]  Chief Complaint  Patient presents with  . New Patient (Initial Visit)     New patient-Lung mass-PET done on 03/07/18 and PFT done on 03/12/18-appt with Dr Janese Banks at [facility]. seeing [place] after Janese Banks.    Referred By Dr. Parks Ranger Reason for Referral right upper lobe mass  HPI Location, Quality, Duration, Severity, Timing, Context, Modifying Factors, Associated Signs and Symptoms.  Joanna Morris is a 68 y.o. female.  She is a lifelong smoker and currently smokes about a pack of cigarettes every day or every other day.  She states that she had a lung cancer screening CT scan performed back in December which was abnormal.  This led to a subsequent referral for a PET scan which was markedly positive in the right upper lobe.  She had 2 lesions identified.  One was in the upper lobe and was spiculated the other in the upper lobe on the right was non-spiculated and was PET negative.  She had some pulmonary function studies done which revealed an FEV1 and DLCO of about 80% and she was then referred to me.  Overall I found her to be a somewhat poor historian with little insight into the process that she had.  She states that she currently smokes and has done so all of her life.  She did not appear to be interested in smoking cessation.   Past Medical History:  Diagnosis Date  . Allergy   . Cataract   . Genital warts   . GERD (gastroesophageal reflux disease)    H/O  . History of kidney stones   . Hyperlipidemia   . Hypertension   . Lung nodule   . Tobacco dependency     Past Surgical History:  Procedure Laterality Date  . ABDOMINAL HYSTERECTOMY    . CESAREAN SECTION     X2  . KNEE ARTHROSCOPY WITH MEDIAL MENISECTOMY Right 05/22/2016   Procedure: KNEE ARTHROSCOPY WITH PARTIAL MEDIAL MENISECTOMY;  Surgeon: Dereck Leep, MD;  Location: [facility] ORS;  Service: Orthopedics;  Laterality: Right;  . OOPHORECTOMY       Family History  Problem Relation Age of Onset  . Heart disease Mother 70       MI  . Heart attack Mother 22  . Diabetes Mother   . Pancreatic cancer Sister   . Ovarian cancer Sister   . Breast cancer Neg Hx     Social History Social History   Tobacco Use  . Smoking status: Current Every Day Smoker    Packs/day: 1.00    Years: 51.00    Pack years: 51.00    Types: Cigarettes  . Smokeless tobacco: Never Used  . Tobacco comment: Quit once for 2 years. pt has rx for chantix  Substance Use Topics  . Alcohol use: Yes    Comment: 1-3 drinks every other week.  . Drug use: No    Allergies  Allergen Reactions  . Tramadol Rash    Current Outpatient Medications  Medication Sig Dispense Refill  . aspirin EC 81 MG tablet Take 1 tablet (81 mg total) by mouth daily.    . calcium carbonate (TUMS - DOSED IN MG ELEMENTAL CALCIUM) 500 MG chewable tablet Chew 1 tablet by mouth daily.    . Cholecalciferol (VITAMIN D3) 1000 units CAPS Take 1,000 Units by mouth daily.    . fluticasone (FLONASE) 50 MCG/ACT nasal spray Place 2 sprays into both  nostrils daily. Use for 4-6 weeks then stop and use seasonally or as needed. 16 g 3  . hydrochlorothiazide (MICROZIDE) 12.5 MG capsule Take 1 capsule (12.5 mg total) by mouth daily. 90 capsule 1  . ketotifen (ZADITOR) 0.025 % ophthalmic solution Place 1-2 drops into both eyes 2 (two) times daily as needed (for itchy eyes).    Marland Kitchen loratadine (CLARITIN) 10 MG tablet Take 1 tablet (10 mg total) by mouth daily. Use for 4-6 weeks then stop, and use as needed or seasonally 30 tablet 11  . rosuvastatin (CRESTOR) 5 MG tablet Take 1 tablet (5 mg total) by mouth at bedtime. 90 tablet 1  . varenicline (CHANTIX PAK) 0.5 MG X 11 & 1 MG X 42 tablet Take one 0.5 mg tab by mouth once daily for 3 days, increase to one 0.5 mg twice daily for 4 days, then increase to one 1 mg twice daily. 53 tablet 0   No current facility-administered medications for this visit.        Review of Systems A complete review of systems was asked and was negative except for the following positive findings shortness of breath with exertion.  Blood pressure (!) 162/91, pulse 76, temperature (!) 97.3 F (36.3 C), temperature source Temporal, height 5\' 1"  (1.549 m), weight 193 lb 3.2 oz (87.6 kg), SpO2 92 %.  Physical Exam CONSTITUTIONAL:  Pleasant, well-developed, well-nourished, and in no acute distress. EYES: Pupils equal and reactive to light, Sclera non-icteric EARS, NOSE, MOUTH AND THROAT:  The oropharynx was clear.  Dentition is good repair.  She is edentulous on the top.  Oral mucosa pink and moist. LYMPH NODES:  Lymph nodes in the neck and axillae were normal RESPIRATORY:  Lungs were clear.  Normal respiratory effort without pathologic use of accessory muscles of respiration CARDIOVASCULAR: Heart was regular without murmurs.  There were no carotid bruits. GI: The abdomen was soft, nontender, and nondistended. There were no palpable masses. There was no hepatosplenomegaly. There were normal bowel sounds in all quadrants. GU:  Rectal deferred.   MUSCULOSKELETAL:  Normal muscle strength and tone.  No clubbing or cyanosis.   SKIN:  There were no pathologic skin lesions.  There were no nodules on palpation. NEUROLOGIC:  Sensation is normal.  Cranial nerves are grossly intact. PSYCH:  Oriented to person, place and time.  Mood and affect are normal.  Data Reviewed CT scan and PET scan  I have personally reviewed the patient's imaging, laboratory findings and medical records.    Assessment    2 right upper lobe lesions 1 spiculated and PET positive consistent with a malignancy.  The other is rounded and consistent with a benign tumor.    Plan    I had a long discussion with her regarding the options.  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  We discussed the role radiation therapy.  We discussed the role that lung biopsy may play.   I reviewed with her the risks of surgery including risks of bleeding, infection, air leak and death.  We also reviewed the options of radiation therapy.  She was accompanied today by her sister and they would like to think about their options.  They will contact me next week to discuss their findings.  They did not desire a appointment with Dr. Donella Stade today.       Nestor Lewandowsky, MD 03/15/2018, 12:32 PM

## 2018-03-15 NOTE — Progress Notes (Signed)
Hematology/Oncology Consult note [facility] Telephone:([phone] [phone]  Patient Care Team: Olin Hauser, DO as PCP - General (Family Medicine) Marry Guan, Laurice Record, MD as Consulting Physician (Orthopedic Surgery) Telford Nab, RN as Registered Nurse   Name of the patient: Joanna Morris  [id number]  1950/10/13    Reason for referral-right upper lobe lung mass   Referring physician-Dr. Parks Ranger  Date of visit: 03/15/18   History of presenting illness-patient is a 68 year old female with a past medical history significant for hypertension and obesity among other medical problems.  She has a longstanding history of smoking and recently underwent CT. Lung cancer screening which showed a 22.4 mm irregular nodule in the right upper lobe.  This was followed by a PET CT scan which showed a spiculated 2.7 cm right upper lobe lung mass with an SUV of 26.8 consistent with primary lung cancer.  There was no evidence of hypermetabolic mediastinal or hilar adenopathy.  No evidence of metastatic disease elsewhere.  Patient referred to [place] for further recommendations.  Overall she is doing well.  She lives alone and is independent of her ADLs and IADLs.  She is also had her PFTs done which have been normal.  Reports that her appetite is good and she denies any unintentional weight loss  ECOG PS- 0  Pain scale- 0   Review of systems- Review of Systems  Constitutional: Positive for malaise/fatigue. Negative for chills, fever and weight loss.  HENT: Negative for congestion, ear discharge and nosebleeds.   Eyes: Negative for blurred vision.  Respiratory: Negative for cough, hemoptysis, sputum production, shortness of breath and wheezing.   Cardiovascular: Negative for chest pain, palpitations, orthopnea and claudication.  Gastrointestinal: Negative for abdominal pain, blood in stool, constipation, diarrhea, heartburn, melena, nausea and vomiting.    Genitourinary: Negative for dysuria, flank pain, frequency, hematuria and urgency.  Musculoskeletal: Negative for back pain, joint pain and myalgias.  Skin: Negative for rash.  Neurological: Negative for dizziness, tingling, focal weakness, seizures, weakness and headaches.  Endo/Heme/Allergies: Does not bruise/bleed easily.  Psychiatric/Behavioral: Negative for depression and suicidal ideas. The patient does not have insomnia.     Allergies  Allergen Reactions  . Tramadol Rash    Patient Active Problem List   Diagnosis Date Noted  . Osteopenia 01/08/2017  . Allergic rhinitis 12/27/2016  . Elevated hemoglobin A1c 12/06/2016  . Elevated LDL cholesterol level 08/21/2016  . Morbid obesity ([place]) 12/10/2015  . Essential hypertension 12/10/2015  . Right medial knee pain 12/10/2015  . Environmental and seasonal allergies 12/10/2015  . Personal history of tobacco use, presenting hazards to health 12/10/2015     Past Medical History:  Diagnosis Date  . Allergy   . Cataract   . Genital warts   . GERD (gastroesophageal reflux disease)    H/O  . History of kidney stones   . Hyperlipidemia   . Hypertension   . Lung nodule   . Tobacco dependency      Past Surgical History:  Procedure Laterality Date  . ABDOMINAL HYSTERECTOMY    . CESAREAN SECTION     X2  . KNEE ARTHROSCOPY WITH MEDIAL MENISECTOMY Right 05/22/2016   Procedure: KNEE ARTHROSCOPY WITH PARTIAL MEDIAL MENISECTOMY;  Surgeon: Dereck Leep, MD;  Location: [facility] ORS;  Service: Orthopedics;  Laterality: Right;  . OOPHORECTOMY      Social History   Socioeconomic History  . Marital status: Divorced    Spouse name: Not on file  . Number of  children: 2  . Years of education: [employer]  . Highest education level: Not on file  Occupational History  . Occupation: Retired  Scientific laboratory technician  . Financial resource strain: Not very hard  . Food insecurity:    Worry: Sometimes true    Inability: Sometimes true  .  Transportation needs:    Medical: No    Non-medical: No  Tobacco Use  . Smoking status: Current Every Day Smoker    Packs/day: 1.00    Years: 51.00    Pack years: 51.00    Types: Cigarettes  . Smokeless tobacco: Never Used  . Tobacco comment: Quit once for 2 years. pt has rx for chantix  Substance and Sexual Activity  . Alcohol use: Yes    Comment: 1-3 drinks every other week.  . Drug use: No  . Sexual activity: Not Currently    Birth control/protection: Surgical  Lifestyle  . Physical activity:    Days per week: 2 days    Minutes per session: 30 min  . Stress: Not at all  Relationships  . Social connections:    Talks on phone: More than three times a week    Gets together: More than three times a week    Attends religious service: More than 4 times per year    Active member of club or organization: No    Attends meetings of clubs or organizations: Never    Relationship status: Divorced  . Intimate partner violence:    Fear of current or ex partner: No    Emotionally abused: No    Physically abused: No    Forced sexual activity: No  Other Topics Concern  . Not on file  Social History Narrative  . Not on file     Family History  Problem Relation Age of Onset  . Heart disease Mother 4       MI  . Heart attack Mother 58  . Diabetes Mother   . Pancreatic cancer Sister   . Ovarian cancer Sister   . Breast cancer Neg Hx      Current Outpatient Medications:  .  aspirin EC 81 MG tablet, Take 1 tablet (81 mg total) by mouth daily., Disp: , Rfl:  .  calcium carbonate (TUMS - DOSED IN MG ELEMENTAL CALCIUM) 500 MG chewable tablet, Chew 1 tablet by mouth daily., Disp: , Rfl:  .  Cholecalciferol (VITAMIN D3) 1000 units CAPS, Take 1,000 Units by mouth daily., Disp: , Rfl:  .  fluticasone (FLONASE) 50 MCG/ACT nasal spray, Place 2 sprays into both nostrils daily. Use for 4-6 weeks then stop and use seasonally or as needed., Disp: 16 g, Rfl: 3 .  hydrochlorothiazide  (MICROZIDE) 12.5 MG capsule, Take 1 capsule (12.5 mg total) by mouth daily., Disp: 90 capsule, Rfl: 1 .  ketotifen (ZADITOR) 0.025 % ophthalmic solution, Place 1-2 drops into both eyes 2 (two) times daily as needed (for itchy eyes)., Disp: , Rfl:  .  loratadine (CLARITIN) 10 MG tablet, Take 1 tablet (10 mg total) by mouth daily. Use for 4-6 weeks then stop, and use as needed or seasonally, Disp: 30 tablet, Rfl: 11 .  rosuvastatin (CRESTOR) 5 MG tablet, Take 1 tablet (5 mg total) by mouth at bedtime., Disp: 90 tablet, Rfl: 1 .  varenicline (CHANTIX PAK) 0.5 MG X 11 & 1 MG X 42 tablet, Take one 0.5 mg tab by mouth once daily for 3 days, increase to one 0.5 mg twice daily for 4 days,  then increase to one 1 mg twice daily., Disp: 53 tablet, Rfl: 0   Physical exam:  Vitals:   03/15/18 0930 03/15/18 0949  BP: 130/88   Pulse: 69   Resp: 18   Temp: (!) 97.2 F (36.2 C)   TempSrc: Tympanic   SpO2: 97%   Weight: 193 lb 9.6 oz (87.8 kg)   Height:  5' 1.02" (1.55 m)   Physical Exam Constitutional:      General: She is not in acute distress.    Appearance: She is obese.  HENT:     Head: Normocephalic and atraumatic.  Eyes:     Pupils: Pupils are equal, round, and reactive to light.  Neck:     Musculoskeletal: Normal range of motion.  Cardiovascular:     Rate and Rhythm: Normal rate and regular rhythm.     Heart sounds: Normal heart sounds.  Pulmonary:     Effort: Pulmonary effort is normal.     Breath sounds: Normal breath sounds.  Abdominal:     General: Bowel sounds are normal.     Palpations: Abdomen is soft.  Skin:    General: Skin is warm and dry.  Neurological:     Mental Status: She is alert and oriented to person, place, and time.        CMP Latest Ref Rng & Units 12/31/2017  Glucose 65 - 99 mg/dL 105(H)  BUN 7 - 25 mg/dL 13  Creatinine 0.50 - 0.99 mg/dL 0.82  Sodium 135 - 146 mmol/L 144  Potassium 3.5 - 5.3 mmol/L 3.9  Chloride 98 - 110 mmol/L 103  CO2 20 - 32  mmol/L 35(H)  Calcium 8.6 - 10.4 mg/dL 9.1  Total Protein 6.1 - 8.1 g/dL 6.8  Total Bilirubin 0.2 - 1.2 mg/dL 0.6  Alkaline Phos 33 - 130 U/L -  AST 10 - 35 U/L 63(H)  ALT 6 - 29 U/L 45(H)   CBC Latest Ref Rng & Units 12/31/2017  WBC 3.8 - 10.8 Thousand/uL 3.6(L)  Hemoglobin 11.7 - 15.5 g/dL 14.5  Hematocrit 35.0 - 45.0 % 43.5  Platelets 140 - 400 Thousand/uL 151    No images are attached to the encounter.  Nm Pet Image Initial (pi) Skull Base To Thigh  Result Date: 03/07/2018 CLINICAL DATA:  Initial treatment strategy for right upper lobe pulmonary lesion on screening chest CT. EXAM: NUCLEAR MEDICINE PET SKULL BASE TO THIGH TECHNIQUE: 10.51 mCi F-18 FDG was injected intravenously. Full-ring PET imaging was performed from the skull base to thigh after the radiotracer. CT data was obtained and used for attenuation correction and anatomic localization. Fasting blood glucose: 104 mg/dl COMPARISON:  CT scan 02/19/2018. FINDINGS: Mediastinal blood pool activity: SUV max 2.98 NECK: No hypermetabolic lymph nodes in the neck. Incidental CT findings: none CHEST: Spiculated 2.7 cm peripheral right upper lobe lung mass is hypermetabolic with SUV max of 02.7 consistent with primary lung neoplasm. I do not see any obvious direct invasion of the chest wall/ribs. No enlarged or hypermetabolic mediastinal or hilar lymph nodes to suggest metastatic adenopathy. The smoothly marginated and well circumscribed rounded 12.5 mm nodule in the right upper lobe is not hypermetabolic with SUV max of 2.0 which is less than background mediastinum. No other pulmonary nodules are identified. Incidental CT findings: 7.5 mm epicardial/subdiaphragmatic lymph node on image number 114 is not hypermetabolic. ABDOMEN/PELVIS: No abnormal hypermetabolic activity within the liver, pancreas, adrenal glands, or spleen. No hypermetabolic lymph nodes in the abdomen or pelvis. There is a  focal area of hypermetabolism involving the ascending  colon with SUV max of 8.02. I do not see an obvious: Cancer but this is somewhat worrisome. Recommend colonoscopy for further evaluation. Incidental CT findings: Small low-attenuation hepatic lesions measure as simple cysts. SKELETON: No focal hypermetabolic activity to suggest skeletal metastasis. Incidental CT findings: none IMPRESSION: 1. 2.7 cm peripheral right upper lobe lung mass is markedly hypermetabolic and consistent with primary lung neoplasm. No findings for metastatic mediastinal or hilar lymphadenopathy. 2. 12.5 mm rounded nodule in the right upper lobe is not hypermetabolic and likely benign. 3. No findings for abdominal/pelvic metastatic disease or osseous metastatic disease. 4. Focal area of hypermetabolism involving the ascending colon somewhat suspicious although I do not see an obvious mass on the CT scan. Recommend correlation with patient's colon cancer screening history and colonoscopy if appropriate. Electronically Signed   By: Marijo Sanes M.D.   On: 03/07/2018 10:34   Ct Chest Lung Cancer Screening Low Dose Wo Contrast  Result Date: 02/19/2018 CLINICAL DATA:  68 year old female current smoker, with 51 pack-year history of smoking, for initial lung cancer screening EXAM: CT CHEST WITHOUT CONTRAST LOW-DOSE FOR LUNG CANCER SCREENING TECHNIQUE: Multidetector CT imaging of the chest was performed following the standard protocol without IV contrast. COMPARISON:  None. FINDINGS: Cardiovascular: Heart is normal in size.  No pericardial effusion. No evidence of thoracic aortic aneurysm. Mild atherosclerotic calcifications of the aortic arch. Mediastinum/Nodes: No suspicious mediastinal lymphadenopathy. Bilateral hilar regions are suboptimally visualized due to lack of intravenous contrast administration. Visualized thyroid is unremarkable. Lungs/Pleura: Mild centrilobular and paraseptal emphysematous changes, upper lobe predominant. 22.4 mm irregular nodule in the lateral right upper lobe  (image 83), abutting/involving the right lateral chest wall. Additional 13.7 cm smooth rounded nodule in the central right upper lobe (image 106). No focal consolidation. No pleural effusion or pneumothorax. Upper Abdomen: Probable bilateral hepatic cysts, measuring up to 1.5 cm in segment 2 (series 2/image 47). Bilateral adrenal glands are within normal limits. Musculoskeletal: Degenerative changes of the visualized thoracolumbar spine. IMPRESSION: Lung-RADS 4B, suspicious. Additional imaging evaluation or consultation with Pulmonology or Thoracic Surgery recommended. 22.4 mm irregular nodule in the lateral right upper lobe, abutting/involving the right lateral chest wall, suspicious for primary bronchogenic neoplasm. Additional 13.7 mm nodule in the central right upper lobe. Discussion at multidisciplinary tumor board is suggested. Consider PET-CT for further evaluation, as clinically warranted. Aortic Atherosclerosis (ICD10-I70.0) and Emphysema (ICD10-J43.9). These results will be called to the ordering clinician or representative by the Radiologist Assistant, and communication documented in the PACS or zVision Dashboard. Electronically Signed   By: Julian Hy M.D.   On: 02/19/2018 12:40    Assessment and plan- Patient is a 68 y.o. female with a spiculated right upper lobe lung mass 2.7 cm concerning for primary bronchogenic carcinoma  I have reviewed PET/CT scan images independently and discussed findings with the patient.  The spiculated right upper lobe 2.7 cm lung mass is highly concerning for lung cancer.  There was no evidence of hypermetabolic hilar or mediastinal adenopathy as well as no evidence of distant metastatic disease.  Patient therefore has early stage I lung cancer based on her PET CT scan.  At this time plan is to proceed with surgical opinion by Dr. Genevive Bi later today.  If she is deemed to be a surgical candidate she will directly proceed with a right upper lobectomy which will give  [place] tissue diagnosis and based on the surgery results we will decide if patient needs  any adjuvant chemotherapy or radiation.  Final pathology shows the primary lung mass is less than 4 cm with no evidence of adenopathy then she would not need any adjuvant chemotherapy.  If patient is not deemed to be a surgical candidate we will need to obtain tissue diagnosis either via CT-guided biopsy or bronchoscopy guided biopsy followed by referral to radiation oncology for consideration for SBRT.  I will also obtain MRI brain with and without contrast to complete her staging work-up  I will decide her follow-up with me based on Dr. Gerome Apley opinion.  Patient also noted to have some nonspecific hypermetabolism in her ascending colon for which I will refer her to GI for a colonoscopy  Thank you for this kind referral and the opportunity to participate in the care of this patient   Visit Diagnosis 1. Abnormal positron emission tomography (PET) of colon   2. Lung mass     Dr. Randa Evens, MD, MPH [facility] [phone] 03/15/2018  2:49 PM

## 2018-03-18 NOTE — Progress Notes (Signed)
  Oncology Nurse Navigator Documentation  Navigator Location: [facility]Med [facility]c (03/15/18 1100)   )Navigator Encounter Type: Clinic/MDC (03/15/18 1100)               Multidisiplinary Clinic Date: 03/15/18 (03/15/18 1100) Multidisiplinary Clinic Type: Thoracic (03/15/18 1100)   Patient Visit Type: MedOnc;Surgery (03/15/18 1100)   Barriers/Navigation Needs: Coordination of Care (03/15/18 1100)   Interventions: Coordination of Care (03/15/18 1100)   Coordination of Care: Appts;Radiology (03/15/18 1100)         met with patient after consultation with Dr. Janese Banks. All questions answered during visit. Reviewed upcoming appts. Contact info given and instructed to call with any further questions or needs. Pt verbalized understanding.         Time Spent with Patient: 60 (03/15/18 1100)

## 2018-03-21 ENCOUNTER — Telehealth: Payer: Self-pay | Admitting: *Deleted

## 2018-03-21 ENCOUNTER — Encounter: Payer: Self-pay | Admitting: *Deleted

## 2018-03-21 NOTE — Telephone Encounter (Signed)
Patient was contacted today and notified that surgery has been scheduled for 04-02-18 at [facility] with Dr. Genevive Bi.   The patient is aware to Pre-Admit on 04-02-18 at 7:30 am. Patient will check in at the [place], [address] (first floor).   The patient is aware to call the office should she have further questions.

## 2018-03-21 NOTE — Telephone Encounter (Signed)
*  Correction-Pre-admit appointment on 03-26-18 at 7:30 am.

## 2018-03-26 ENCOUNTER — Ambulatory Visit
Admission: RE | Admit: 2018-03-26 | Discharge: 2018-03-26 | Disposition: A | Discharge status: Home / Self Care | Payer: Medicare Other | Source: Ambulatory Visit | Attending: Cardiothoracic Surgery | Admitting: Cardiothoracic Surgery

## 2018-03-26 ENCOUNTER — Other Ambulatory Visit: Payer: Self-pay

## 2018-03-26 ENCOUNTER — Ambulatory Visit: Payer: Medicare Other

## 2018-03-26 ENCOUNTER — Telehealth: Payer: Self-pay

## 2018-03-26 ENCOUNTER — Encounter
Admission: RE | Admit: 2018-03-26 | Discharge: 2018-03-26 | Disposition: A | Discharge status: Home / Self Care | Payer: Medicare Other | Source: Ambulatory Visit | Attending: Cardiothoracic Surgery | Admitting: Cardiothoracic Surgery

## 2018-03-26 DIAGNOSIS — Z01810 Encounter for preprocedural cardiovascular examination: Secondary | ICD-10-CM

## 2018-03-26 DIAGNOSIS — R918 Other nonspecific abnormal finding of lung field: Secondary | ICD-10-CM | POA: Diagnosis not present

## 2018-03-26 LAB — COMPREHENSIVE METABOLIC PANEL
ALT: 96 U/L — AB (ref 0–44)
AST: 99 U/L — ABNORMAL HIGH (ref 15–41)
Albumin: 3.4 g/dL — ABNORMAL LOW (ref 3.5–5.0)
Alkaline Phosphatase: 51 U/L (ref 38–126)
Anion gap: 8 (ref 5–15)
BUN: 16 mg/dL (ref 8–23)
CO2: 29 mmol/L (ref 22–32)
Calcium: 9.1 mg/dL (ref 8.9–10.3)
Chloride: 102 mmol/L (ref 98–111)
Creatinine, Ser: 0.78 mg/dL (ref 0.44–1.00)
GFR calc non Af Amer: >60 mL/min (ref >60)
Glucose, Bld: 100 mg/dL — ABNORMAL HIGH (ref 70–99)
Potassium: 3.2 mmol/L — ABNORMAL LOW (ref 3.5–5.1)
Sodium: 139 mmol/L (ref 135–145)
Total Bilirubin: 0.7 mg/dL (ref 0.3–1.2)
Total Protein: 7.3 g/dL (ref 6.5–8.1)

## 2018-03-26 LAB — CBC WITH DIFFERENTIAL/PLATELET
Abs Immature Granulocytes: 0 10*3/uL (ref 0.00–0.07)
Basophils Absolute: 0 10*3/uL (ref 0.0–0.1)
Basophils Relative: 1 %
Eosinophils Absolute: 0.1 10*3/uL (ref 0.0–0.5)
Eosinophils Relative: 2 %
HCT: 45.8 % (ref 36.0–46.0)
Hemoglobin: 15 g/dL (ref 12.0–15.0)
Immature Granulocytes: 0 %
Lymphocytes Relative: 51 %
Lymphs Abs: 1.9 10*3/uL (ref 0.7–4.0)
MCH: 29.1 pg (ref 26.0–34.0)
MCHC: 32.8 g/dL (ref 30.0–36.0)
MCV: 88.8 fL (ref 80.0–100.0)
Monocytes Absolute: 0.4 10*3/uL (ref 0.1–1.0)
Monocytes Relative: 10 %
Neutro Abs: 1.3 10*3/uL — ABNORMAL LOW (ref 1.7–7.7)
Neutrophils Relative %: 36 %
Platelets: 123 10*3/uL — ABNORMAL LOW (ref 150–400)
RBC: 5.16 MIL/uL — ABNORMAL HIGH (ref 3.87–5.11)
RDW: 12.2 % (ref 11.5–15.5)
WBC: 3.7 10*3/uL — ABNORMAL LOW (ref 4.0–10.5)
nRBC: 0 % (ref 0.0–0.2)

## 2018-03-26 LAB — PROTIME-INR
INR: 1.07
Prothrombin Time: 13.8 seconds (ref 11.4–15.2)

## 2018-03-26 LAB — APTT: aPTT: 31 seconds (ref 24–36)

## 2018-03-26 MED ORDER — POTASSIUM CHLORIDE CRYS ER 20 MEQ PO TBCR: 20.0000 meq | EXTENDED_RELEASE_TABLET | Freq: Two times a day (BID) | ORAL | 0 refills | Status: DC

## 2018-03-26 NOTE — Pre-Procedure Instructions (Signed)
Faxed abnormal lab results to Dr. Genevive Bi. Fax confirmation received.

## 2018-03-26 NOTE — Patient Instructions (Signed)
Your procedure is scheduled on: Tuesday 04/02/18  Report to [place]. To find out your arrival time please call [phone] between 1PM - 3PM on Monday 04/01/18.   Remember: Instructions that are not followed completely may result in serious medical risk, up to and including death, or upon the discretion of your surgeon and anesthesiologist your surgery may need to be rescheduled.      _X__ 1. Do not eat food after midnight the night before your procedure.                 No gum chewing or hard candies. You may drink clear liquids up to 2 hours                 before you are scheduled to arrive for your surgery- DO NOT drink clear                 liquids within 2 hours of the start of your surgery.                 Clear Liquids include:  water, apple juice without pulp, clear carbohydrate                 drink such as Clearfast or Gatorade, Black Coffee or Tea (Do not add                 anything to coffee or tea).   __X__2.  On the morning of surgery brush your teeth with toothpaste and water, you may rinse your mouth with mouthwash if you wish.  Do not swallow any toothpaste or mouthwash.      _X__ 3.  No Alcohol for 24 hours before or after surgery.   _X__ 4.  Do Not Smoke or use e-cigarettes For 24 Hours Prior to Your Surgery.                 Do not use any chewable tobacco products for at least 6 hours prior to                 surgery.  __X__5.  Notify your doctor if there is any change in your medical condition      (cold, fever, infections).      Do not wear jewelry, make-up, hairpins, clips or nail polish. Do not wear lotions, powders, or perfumes.  Do not wear deodorant. Do not shave 48 hours prior to surgery. Men may shave face and neck. Do not bring valuables to the hospital.    [facility] is not responsible for any belongings or valuables.  Contacts, dentures/partials or body piercings may not be worn into  surgery. Bring a case for your contacts, glasses or hearing aids, a denture cup will be supplied.   Leave your suitcase in the car. After surgery it may be brought to your room.   For patients admitted to the hospital, discharge time is determined by your treatment team.     Please read over the following fact sheets that you were given:   MRSA Information   __X__ Take these medicines the morning of surgery with A SIP OF WATER:     1. rosuvastatin (CRESTOR) 5 MG tablet      __X__ Use CHG Soap as directed    __X__ Stop Blood Thinners Coumadin/Plavix/Xarelto/Pleta/Pradaxa/Eliquis/Effient/Aspirin. Your last dose will be on Friday 03/29/18.    __X__ Stop Anti-inflammatories 7 days before surgery such as Advil,  Ibuprofen, Motrin, BC or Goodies Powder, Naprosyn, Naproxen, Aleve, Aspirin, Meloxicam. May take Tylenol if needed for pain or discomfort.     __X__ Do not start any new herbal supplements before your surgery.

## 2018-03-26 NOTE — Telephone Encounter (Signed)
Call to the patient to let her know that her potassium levels came back low at 3.2. Per protocol we will send in a prescription for Potassium Chloride 20 meq. She will take one twice daily for 5 days. The patient is aware of the directions.

## 2018-04-01 ENCOUNTER — Telehealth: Payer: Self-pay | Admitting: *Deleted

## 2018-04-01 MED ORDER — CEFAZOLIN SODIUM-DEXTROSE 2-4 GM/100ML-% IV SOLN
2.0000 g | INTRAVENOUS | Status: AC
  Administered 2018-04-02: 2 g via INTRAVENOUS

## 2018-04-01 NOTE — Telephone Encounter (Signed)
Made several attempts to contact patient to inform about referral to [facility]. Pt did not answer and has no voicemail set up to leave a message. Will try again at a later time.

## 2018-04-02 ENCOUNTER — Inpatient Hospital Stay: Payer: Medicare Other

## 2018-04-02 ENCOUNTER — Other Ambulatory Visit: Payer: Self-pay

## 2018-04-02 ENCOUNTER — Encounter: Payer: Self-pay | Admitting: *Deleted

## 2018-04-02 ENCOUNTER — Encounter
Admission: RE | Disposition: A | Discharge status: Home Health | Payer: Self-pay | Source: Home / Self Care | Attending: Cardiothoracic Surgery

## 2018-04-02 ENCOUNTER — Inpatient Hospital Stay
Admission: RE | Admit: 2018-04-02 | Discharge: 2018-04-10 | DRG: 163 | Disposition: A | Discharge status: Home Health | Payer: Medicare Other | Attending: Cardiothoracic Surgery | Admitting: Cardiothoracic Surgery

## 2018-04-02 DIAGNOSIS — E876 Hypokalemia: Secondary | ICD-10-CM | POA: Diagnosis not present

## 2018-04-02 DIAGNOSIS — Z9889 Other specified postprocedural states: Secondary | ICD-10-CM

## 2018-04-02 DIAGNOSIS — C3490 Malignant neoplasm of unspecified part of unspecified bronchus or lung: Secondary | ICD-10-CM | POA: Diagnosis not present

## 2018-04-02 DIAGNOSIS — E785 Hyperlipidemia, unspecified: Secondary | ICD-10-CM | POA: Diagnosis not present

## 2018-04-02 DIAGNOSIS — R911 Solitary pulmonary nodule: Secondary | ICD-10-CM | POA: Diagnosis not present

## 2018-04-02 DIAGNOSIS — J439 Emphysema, unspecified: Secondary | ICD-10-CM | POA: Diagnosis not present

## 2018-04-02 DIAGNOSIS — Z8249 Family history of ischemic heart disease and other diseases of the circulatory system: Secondary | ICD-10-CM

## 2018-04-02 DIAGNOSIS — Z87442 Personal history of urinary calculi: Secondary | ICD-10-CM | POA: Diagnosis not present

## 2018-04-02 DIAGNOSIS — R918 Other nonspecific abnormal finding of lung field: Secondary | ICD-10-CM | POA: Diagnosis present

## 2018-04-02 DIAGNOSIS — J9811 Atelectasis: Secondary | ICD-10-CM | POA: Diagnosis not present

## 2018-04-02 DIAGNOSIS — I517 Cardiomegaly: Secondary | ICD-10-CM | POA: Diagnosis not present

## 2018-04-02 DIAGNOSIS — Z4682 Encounter for fitting and adjustment of non-vascular catheter: Secondary | ICD-10-CM | POA: Diagnosis not present

## 2018-04-02 DIAGNOSIS — J449 Chronic obstructive pulmonary disease, unspecified: Secondary | ICD-10-CM | POA: Diagnosis present

## 2018-04-02 DIAGNOSIS — Z833 Family history of diabetes mellitus: Secondary | ICD-10-CM | POA: Diagnosis not present

## 2018-04-02 DIAGNOSIS — J95812 Postprocedural air leak: Secondary | ICD-10-CM | POA: Diagnosis not present

## 2018-04-02 DIAGNOSIS — R000 Tachycardia, unspecified: Secondary | ICD-10-CM | POA: Diagnosis not present

## 2018-04-02 DIAGNOSIS — I10 Essential (primary) hypertension: Secondary | ICD-10-CM | POA: Diagnosis not present

## 2018-04-02 DIAGNOSIS — Z7689 Persons encountering health services in other specified circumstances: Secondary | ICD-10-CM | POA: Diagnosis not present

## 2018-04-02 DIAGNOSIS — J984 Other disorders of lung: Secondary | ICD-10-CM | POA: Diagnosis not present

## 2018-04-02 DIAGNOSIS — N179 Acute kidney failure, unspecified: Secondary | ICD-10-CM | POA: Diagnosis not present

## 2018-04-02 DIAGNOSIS — C3411 Malignant neoplasm of upper lobe, right bronchus or lung: Principal | ICD-10-CM | POA: Diagnosis present

## 2018-04-02 DIAGNOSIS — R509 Fever, unspecified: Secondary | ICD-10-CM

## 2018-04-02 DIAGNOSIS — Z90721 Acquired absence of ovaries, unilateral: Secondary | ICD-10-CM

## 2018-04-02 DIAGNOSIS — Z7982 Long term (current) use of aspirin: Secondary | ICD-10-CM

## 2018-04-02 DIAGNOSIS — E669 Obesity, unspecified: Secondary | ICD-10-CM | POA: Diagnosis present

## 2018-04-02 DIAGNOSIS — Z8041 Family history of malignant neoplasm of ovary: Secondary | ICD-10-CM | POA: Diagnosis not present

## 2018-04-02 DIAGNOSIS — R739 Hyperglycemia, unspecified: Secondary | ICD-10-CM | POA: Diagnosis present

## 2018-04-02 DIAGNOSIS — G9341 Metabolic encephalopathy: Secondary | ICD-10-CM | POA: Diagnosis present

## 2018-04-02 DIAGNOSIS — Z800 Family history of malignant neoplasm of digestive organs: Secondary | ICD-10-CM

## 2018-04-02 DIAGNOSIS — Z09 Encounter for follow-up examination after completed treatment for conditions other than malignant neoplasm: Secondary | ICD-10-CM

## 2018-04-02 DIAGNOSIS — Z6838 Body mass index (BMI) 38.0-38.9, adult: Secondary | ICD-10-CM

## 2018-04-02 DIAGNOSIS — J939 Pneumothorax, unspecified: Secondary | ICD-10-CM | POA: Diagnosis not present

## 2018-04-02 DIAGNOSIS — R5082 Postprocedural fever: Secondary | ICD-10-CM | POA: Diagnosis not present

## 2018-04-02 DIAGNOSIS — F17210 Nicotine dependence, cigarettes, uncomplicated: Secondary | ICD-10-CM | POA: Diagnosis present

## 2018-04-02 DIAGNOSIS — Z888 Allergy status to other drugs, medicaments and biological substances status: Secondary | ICD-10-CM

## 2018-04-02 DIAGNOSIS — Z9689 Presence of other specified functional implants: Secondary | ICD-10-CM

## 2018-04-02 DIAGNOSIS — G9340 Encephalopathy, unspecified: Secondary | ICD-10-CM | POA: Diagnosis not present

## 2018-04-02 DIAGNOSIS — K5900 Constipation, unspecified: Secondary | ICD-10-CM | POA: Diagnosis not present

## 2018-04-02 DIAGNOSIS — J95811 Postprocedural pneumothorax: Secondary | ICD-10-CM | POA: Diagnosis not present

## 2018-04-02 DIAGNOSIS — Z90710 Acquired absence of both cervix and uterus: Secondary | ICD-10-CM

## 2018-04-02 DIAGNOSIS — Z79899 Other long term (current) drug therapy: Secondary | ICD-10-CM | POA: Diagnosis not present

## 2018-04-02 DIAGNOSIS — R410 Disorientation, unspecified: Secondary | ICD-10-CM | POA: Diagnosis not present

## 2018-04-02 DIAGNOSIS — Z720 Tobacco use: Secondary | ICD-10-CM | POA: Diagnosis not present

## 2018-04-02 HISTORY — PX: THORACOTOMY: SHX5074

## 2018-04-02 LAB — BASIC METABOLIC PANEL
Anion gap: 7 (ref 5–15)
BUN: 13 mg/dL (ref 8–23)
CO2: 24 mmol/L (ref 22–32)
Calcium: 8.2 mg/dL — ABNORMAL LOW (ref 8.9–10.3)
Chloride: 104 mmol/L (ref 98–111)
Creatinine, Ser: 0.75 mg/dL (ref 0.44–1.00)
GFR calc Af Amer: >60 mL/min (ref >60)
GFR calc non Af Amer: >60 mL/min (ref >60)
Glucose, Bld: 180 mg/dL — ABNORMAL HIGH (ref 70–99)
Potassium: 3.7 mmol/L (ref 3.5–5.1)
Sodium: 135 mmol/L (ref 135–145)

## 2018-04-02 LAB — CBC
HEMATOCRIT: 44.7 % (ref 36.0–46.0)
Hemoglobin: 14.4 g/dL (ref 12.0–15.0)
MCH: 29 pg (ref 26.0–34.0)
MCHC: 32.2 g/dL (ref 30.0–36.0)
MCV: 90.1 fL (ref 80.0–100.0)
Platelets: 132 10*3/uL — ABNORMAL LOW (ref 150–400)
RBC: 4.96 MIL/uL (ref 3.87–5.11)
RDW: 12.4 % (ref 11.5–15.5)
WBC: 8.9 10*3/uL (ref 4.0–10.5)
nRBC: 0 % (ref 0.0–0.2)

## 2018-04-02 LAB — POCT I-STAT 4, (NA,K, GLUC, HGB,HCT)
Glucose, Bld: 91 mg/dL (ref 70–99)
HCT: 45 % (ref 36.0–46.0)
HEMOGLOBIN: 15.3 g/dL — AB (ref 12.0–15.0)
Potassium: 4.3 mmol/L (ref 3.5–5.1)
Sodium: 142 mmol/L (ref 135–145)

## 2018-04-02 LAB — GLUCOSE, CAPILLARY
GLUCOSE-CAPILLARY: 163 mg/dL — AB (ref 70–99)
Glucose-Capillary: 175 mg/dL — ABNORMAL HIGH (ref 70–99)

## 2018-04-02 LAB — MRSA PCR SCREENING: MRSA BY PCR: NEGATIVE

## 2018-04-02 LAB — ABO/RH: ABO/RH(D): B POS

## 2018-04-02 SURGERY — THORACOTOMY, MAJOR
Anesthesia: General | Laterality: Right

## 2018-04-02 MED ORDER — BUPIVACAINE HCL 0.25 % IJ SOLN
INTRAMUSCULAR | Status: DC | PRN
  Administered 2018-04-02: 30 mL

## 2018-04-02 MED ORDER — FENTANYL CITRATE (PF) 100 MCG/2ML IJ SOLN
INTRAMUSCULAR | Status: AC
  Filled 2018-04-02: qty 2

## 2018-04-02 MED ORDER — MIDAZOLAM HCL 2 MG/2ML IJ SOLN
INTRAMUSCULAR | Status: DC | PRN
  Administered 2018-04-02: 2 mg via INTRAVENOUS

## 2018-04-02 MED ORDER — IPRATROPIUM-ALBUTEROL 0.5-2.5 (3) MG/3ML IN SOLN
RESPIRATORY_TRACT | Status: AC
  Filled 2018-04-02: qty 3

## 2018-04-02 MED ORDER — OXYCODONE HCL 5 MG PO TABS
5.0000 mg | ORAL_TABLET | ORAL | Status: DC | PRN
  Administered 2018-04-03 – 2018-04-04 (×2): 5 mg via ORAL
  Administered 2018-04-04: 10 mg via ORAL
  Filled 2018-04-02: qty 1
  Filled 2018-04-02: qty 2
  Filled 2018-04-02: qty 1

## 2018-04-02 MED ORDER — LACTATED RINGERS IV SOLN
INTRAVENOUS | Status: DC
  Administered 2018-04-02 (×2): via INTRAVENOUS

## 2018-04-02 MED ORDER — ROSUVASTATIN CALCIUM 10 MG PO TABS
5.0000 mg | ORAL_TABLET | Freq: Every day | ORAL | Status: DC
  Administered 2018-04-03 – 2018-04-09 (×7): 5 mg via ORAL
  Filled 2018-04-02 (×7): qty 1

## 2018-04-02 MED ORDER — KETAMINE HCL 10 MG/ML IJ SOLN
INTRAMUSCULAR | Status: DC | PRN
  Administered 2018-04-02: 30 mg via INTRAVENOUS
  Administered 2018-04-02 (×2): 10 mg via INTRAVENOUS

## 2018-04-02 MED ORDER — ONDANSETRON HCL 4 MG/2ML IJ SOLN: 4.0000 mg | Freq: Once | INTRAMUSCULAR | Status: DC | PRN

## 2018-04-02 MED ORDER — KCL IN DEXTROSE-NACL 20-5-0.45 MEQ/L-%-% IV SOLN
INTRAVENOUS | Status: DC
  Administered 2018-04-02 – 2018-04-03 (×2): via INTRAVENOUS
  Filled 2018-04-02 (×3): qty 1000

## 2018-04-02 MED ORDER — LIDOCAINE HCL (PF) 2 % IJ SOLN
INTRAMUSCULAR | Status: AC
  Filled 2018-04-02: qty 10

## 2018-04-02 MED ORDER — PROPOFOL 10 MG/ML IV BOLUS
INTRAVENOUS | Status: DC | PRN
  Administered 2018-04-02: 180 mg via INTRAVENOUS
  Administered 2018-04-02: 20 mg via INTRAVENOUS

## 2018-04-02 MED ORDER — ACETAMINOPHEN 10 MG/ML IV SOLN
INTRAVENOUS | Status: DC | PRN
  Administered 2018-04-02: 1000 mg via INTRAVENOUS

## 2018-04-02 MED ORDER — ONDANSETRON HCL 4 MG/2ML IJ SOLN
INTRAMUSCULAR | Status: DC | PRN
  Administered 2018-04-02: 4 mg via INTRAVENOUS

## 2018-04-02 MED ORDER — DEXAMETHASONE SODIUM PHOSPHATE 10 MG/ML IJ SOLN
INTRAMUSCULAR | Status: DC | PRN
  Administered 2018-04-02: 10 mg via INTRAVENOUS

## 2018-04-02 MED ORDER — MIDAZOLAM HCL 2 MG/2ML IJ SOLN
INTRAMUSCULAR | Status: AC
  Filled 2018-04-02: qty 2

## 2018-04-02 MED ORDER — GLYCOPYRROLATE 0.2 MG/ML IJ SOLN
INTRAMUSCULAR | Status: DC | PRN
  Administered 2018-04-02 (×2): 0.1 mg via INTRAVENOUS

## 2018-04-02 MED ORDER — ONDANSETRON HCL 4 MG/2ML IJ SOLN
INTRAMUSCULAR | Status: AC
  Filled 2018-04-02: qty 2

## 2018-04-02 MED ORDER — FAMOTIDINE 20 MG PO TABS
20.0000 mg | ORAL_TABLET | Freq: Once | ORAL | Status: AC
  Administered 2018-04-02: 20 mg via ORAL

## 2018-04-02 MED ORDER — HYDROCHLOROTHIAZIDE 12.5 MG PO CAPS
12.5000 mg | ORAL_CAPSULE | Freq: Every day | ORAL | Status: DC
  Administered 2018-04-03 – 2018-04-05 (×3): 12.5 mg via ORAL
  Filled 2018-04-02 (×4): qty 1

## 2018-04-02 MED ORDER — BISACODYL 5 MG PO TBEC
10.0000 mg | DELAYED_RELEASE_TABLET | Freq: Every day | ORAL | Status: DC
  Administered 2018-04-03 – 2018-04-09 (×7): 10 mg via ORAL
  Filled 2018-04-02 (×8): qty 2

## 2018-04-02 MED ORDER — ACETAMINOPHEN 10 MG/ML IV SOLN
INTRAVENOUS | Status: AC
  Filled 2018-04-02: qty 100

## 2018-04-02 MED ORDER — SCOPOLAMINE 1 MG/3DAYS TD PT72
1.0000 | MEDICATED_PATCH | TRANSDERMAL | Status: DC
  Administered 2018-04-02: 1 via TRANSDERMAL

## 2018-04-02 MED ORDER — ROCURONIUM BROMIDE 100 MG/10ML IV SOLN
INTRAVENOUS | Status: DC | PRN
  Administered 2018-04-02: 40 mg via INTRAVENOUS
  Administered 2018-04-02 (×5): 10 mg via INTRAVENOUS

## 2018-04-02 MED ORDER — INSULIN ASPART 100 UNIT/ML ~~LOC~~ SOLN
0.0000 [IU] | SUBCUTANEOUS | Status: DC
  Administered 2018-04-02: 3 [IU] via SUBCUTANEOUS
  Administered 2018-04-03: 2 [IU] via SUBCUTANEOUS
  Administered 2018-04-03: 3 [IU] via SUBCUTANEOUS
  Administered 2018-04-03 – 2018-04-04 (×4): 2 [IU] via SUBCUTANEOUS
  Filled 2018-04-02 (×7): qty 1

## 2018-04-02 MED ORDER — FENTANYL CITRATE (PF) 100 MCG/2ML IJ SOLN
25.0000 ug | INTRAMUSCULAR | Status: DC | PRN
  Administered 2018-04-02: 25 ug via INTRAVENOUS

## 2018-04-02 MED ORDER — SODIUM CHLORIDE 0.9 % IV SOLN
INTRAVENOUS | Status: DC | PRN
  Administered 2018-04-02: 70 mL

## 2018-04-02 MED ORDER — SODIUM CHLORIDE FLUSH 0.9 % IV SOLN
INTRAVENOUS | Status: AC
  Filled 2018-04-02: qty 10

## 2018-04-02 MED ORDER — ROCURONIUM BROMIDE 50 MG/5ML IV SOLN
INTRAVENOUS | Status: AC
  Filled 2018-04-02: qty 2

## 2018-04-02 MED ORDER — SCOPOLAMINE 1 MG/3DAYS TD PT72
MEDICATED_PATCH | TRANSDERMAL | Status: AC
  Filled 2018-04-02: qty 1

## 2018-04-02 MED ORDER — LIDOCAINE HCL (CARDIAC) PF 100 MG/5ML IV SOSY
PREFILLED_SYRINGE | INTRAVENOUS | Status: DC | PRN
  Administered 2018-04-02: 100 mg via INTRAVENOUS

## 2018-04-02 MED ORDER — MORPHINE SULFATE (PF) 2 MG/ML IV SOLN
1.0000 mg | INTRAVENOUS | Status: DC | PRN
  Administered 2018-04-02 – 2018-04-04 (×10): 2 mg via INTRAVENOUS
  Filled 2018-04-02 (×10): qty 1

## 2018-04-02 MED ORDER — KETAMINE HCL 50 MG/ML IJ SOLN
INTRAMUSCULAR | Status: AC
  Filled 2018-04-02: qty 10

## 2018-04-02 MED ORDER — PROPOFOL 10 MG/ML IV BOLUS
INTRAVENOUS | Status: AC
  Filled 2018-04-02: qty 40

## 2018-04-02 MED ORDER — CHLORHEXIDINE GLUCONATE CLOTH 2 % EX PADS
6.0000 | MEDICATED_PAD | Freq: Once | CUTANEOUS | Status: DC
  Administered 2018-04-02: 6 via TOPICAL

## 2018-04-02 MED ORDER — SEVOFLURANE IN SOLN
RESPIRATORY_TRACT | Status: AC
  Filled 2018-04-02: qty 250

## 2018-04-02 MED ORDER — IPRATROPIUM-ALBUTEROL 0.5-2.5 (3) MG/3ML IN SOLN
3.0000 mL | Freq: Once | RESPIRATORY_TRACT | Status: AC
  Administered 2018-04-02: 3 mL via RESPIRATORY_TRACT

## 2018-04-02 MED ORDER — HYDROMORPHONE HCL 1 MG/ML IJ SOLN
0.2500 mg | INTRAMUSCULAR | Status: DC | PRN
  Administered 2018-04-02 (×2): 0.25 mg via INTRAVENOUS

## 2018-04-02 MED ORDER — DEXMEDETOMIDINE HCL 200 MCG/2ML IV SOLN
INTRAVENOUS | Status: DC | PRN
  Administered 2018-04-02 (×2): 4 ug via INTRAVENOUS
  Administered 2018-04-02: 8 ug via INTRAVENOUS
  Administered 2018-04-02 (×2): 4 ug via INTRAVENOUS
  Administered 2018-04-02: 8 ug via INTRAVENOUS

## 2018-04-02 MED ORDER — FENTANYL CITRATE (PF) 100 MCG/2ML IJ SOLN
INTRAMUSCULAR | Status: AC
  Administered 2018-04-02: 25 ug via INTRAVENOUS
  Filled 2018-04-02: qty 2

## 2018-04-02 MED ORDER — ALBUTEROL SULFATE (2.5 MG/3ML) 0.083% IN NEBU: 2.5000 mg | INHALATION_SOLUTION | RESPIRATORY_TRACT | Status: DC

## 2018-04-02 MED ORDER — SUCCINYLCHOLINE CHLORIDE 20 MG/ML IJ SOLN
INTRAMUSCULAR | Status: DC | PRN
  Administered 2018-04-02: 100 mg via INTRAVENOUS

## 2018-04-02 MED ORDER — HYDROMORPHONE HCL 1 MG/ML IJ SOLN
INTRAMUSCULAR | Status: AC
  Administered 2018-04-02: 0.25 mg via INTRAVENOUS
  Filled 2018-04-02: qty 1

## 2018-04-02 MED ORDER — ONDANSETRON HCL 4 MG/2ML IJ SOLN: 4.0000 mg | Freq: Four times a day (QID) | INTRAMUSCULAR | Status: DC | PRN

## 2018-04-02 MED ORDER — DEXAMETHASONE SODIUM PHOSPHATE 10 MG/ML IJ SOLN
INTRAMUSCULAR | Status: AC
  Filled 2018-04-02: qty 1

## 2018-04-02 MED ORDER — SUGAMMADEX SODIUM 200 MG/2ML IV SOLN
INTRAVENOUS | Status: DC | PRN
  Administered 2018-04-02: 175 mg via INTRAVENOUS

## 2018-04-02 MED ORDER — FENTANYL CITRATE (PF) 100 MCG/2ML IJ SOLN
INTRAMUSCULAR | Status: DC | PRN
  Administered 2018-04-02 (×4): 50 ug via INTRAVENOUS

## 2018-04-02 MED ORDER — CEFAZOLIN SODIUM-DEXTROSE 2-4 GM/100ML-% IV SOLN
2.0000 g | Freq: Three times a day (TID) | INTRAVENOUS | Status: AC
  Administered 2018-04-02 – 2018-04-03 (×2): 2 g via INTRAVENOUS
  Filled 2018-04-02 (×2): qty 100

## 2018-04-02 MED ORDER — PHENYLEPHRINE HCL 10 MG/ML IJ SOLN
INTRAMUSCULAR | Status: DC | PRN
  Administered 2018-04-02 (×8): 100 ug via INTRAVENOUS

## 2018-04-02 MED ORDER — ALBUTEROL SULFATE (2.5 MG/3ML) 0.083% IN NEBU
2.5000 mg | INHALATION_SOLUTION | RESPIRATORY_TRACT | Status: DC
  Administered 2018-04-02 – 2018-04-03 (×3): 2.5 mg via RESPIRATORY_TRACT
  Filled 2018-04-02 (×3): qty 3

## 2018-04-02 MED ORDER — CHLORHEXIDINE GLUCONATE CLOTH 2 % EX PADS: 6.0000 | MEDICATED_PAD | Freq: Once | CUTANEOUS | Status: DC

## 2018-04-02 SURGICAL SUPPLY — 75 items
BENZOIN TINCTURE PRP APPL 2/3 (GAUZE/BANDAGES/DRESSINGS) IMPLANT
BNDG COHESIVE 4X5 TAN STRL (GAUZE/BANDAGES/DRESSINGS) IMPLANT
BRONCHOSCOPE PED SLIM DISP (MISCELLANEOUS) ×2 IMPLANT
CANISTER SUCT 1200ML W/VALVE (MISCELLANEOUS) ×2 IMPLANT
CATH  RT ANGL  28F  SOF (CATHETERS) ×1
CATH RT ANGL 28F SOF (CATHETERS) ×1 IMPLANT
CATH THOR STR 28F  SOFT WA (CATHETERS) ×1
CATH THOR STR 28F SOFT WA (CATHETERS) ×1 IMPLANT
CATH URET ROBINSON 16FR STRL (CATHETERS) IMPLANT
CHLORAPREP W/TINT 26ML (MISCELLANEOUS) ×4 IMPLANT
CNTNR SPEC 2.5X3XGRAD LEK (MISCELLANEOUS)
CONN REDUCER 3/8X3/8X3/8Y (CONNECTOR) ×2
CONNECTOR REDUCER 3/8X3/8 (MISCELLANEOUS) ×4 IMPLANT
CONNECTOR REDUCER 3/8X3/8X3/8Y (CONNECTOR) ×1 IMPLANT
CONT SPEC 4OZ STER OR WHT (MISCELLANEOUS)
CONTAINER SPEC 2.5X3XGRAD LEK (MISCELLANEOUS) IMPLANT
CUTTER ECHEON FLEX ENDO 45 340 (ENDOMECHANICALS) ×2 IMPLANT
DEFOGGER SCOPE WARMER CLEARIFY (MISCELLANEOUS) ×2 IMPLANT
DRAIN CHEST DRY SUCT SGL (MISCELLANEOUS) ×2 IMPLANT
DRAPE C-SECTION (MISCELLANEOUS) ×2 IMPLANT
DRAPE MAG INST 16X20 L/F (DRAPES) ×2 IMPLANT
DRSG OPSITE POSTOP 4X6 (GAUZE/BANDAGES/DRESSINGS) IMPLANT
DRSG OPSITE POSTOP 4X8 (GAUZE/BANDAGES/DRESSINGS) IMPLANT
DRSG TELFA 3X8 NADH (GAUZE/BANDAGES/DRESSINGS) ×2 IMPLANT
ELECT BLADE 6.5 EXT (BLADE) ×4 IMPLANT
ELECT CAUTERY BLADE TIP 2.5 (TIP) ×2
ELECT REM PT RETURN 9FT ADLT (ELECTROSURGICAL) ×2
ELECTRODE CAUTERY BLDE TIP 2.5 (TIP) ×1 IMPLANT
ELECTRODE REM PT RTRN 9FT ADLT (ELECTROSURGICAL) ×1 IMPLANT
GAUZE SPONGE 4X4 12PLY STRL (GAUZE/BANDAGES/DRESSINGS) IMPLANT
GLOVE SURG SYN 7.5  E (GLOVE) ×10
GLOVE SURG SYN 7.5 E (GLOVE) ×10 IMPLANT
GOWN STRL REUS W/ TWL LRG LVL3 (GOWN DISPOSABLE) ×5 IMPLANT
GOWN STRL REUS W/TWL LRG LVL3 (GOWN DISPOSABLE) ×5
KIT TURNOVER KIT A (KITS) ×2 IMPLANT
LABEL OR SOLS (LABEL) ×2 IMPLANT
LOOP RED MAXI  1X406MM (MISCELLANEOUS) ×1
LOOP VESSEL MAXI 1X406 RED (MISCELLANEOUS) ×1 IMPLANT
MARKER SKIN DUAL TIP RULER LAB (MISCELLANEOUS) ×2 IMPLANT
NEEDLE SPNL 22GX3.5 QUINCKE BK (NEEDLE) ×2 IMPLANT
PACK BASIN MAJOR ARMC (MISCELLANEOUS) ×2 IMPLANT
RELOAD PROXIMATE TA60MM GREEN (ENDOMECHANICALS) IMPLANT
RELOAD STAPLER LINE PROX 30 GR (STAPLE) ×1 IMPLANT
SPONGE KITTNER 5P (MISCELLANEOUS) ×6 IMPLANT
STAPLE RELOAD 2.5MM WHITE (STAPLE) ×4 IMPLANT
STAPLE RELOAD 45MM GOLD (STAPLE) ×6 IMPLANT
STAPLER RELOAD LINE PROX 30 GR (STAPLE) ×2
STAPLER SKIN PROX 35W (STAPLE) IMPLANT
STAPLER VASCULAR ECHELON 35 (CUTTER) ×2 IMPLANT
STRIP CLOSURE SKIN 1/2X4 (GAUZE/BANDAGES/DRESSINGS) ×2 IMPLANT
SUT MNCRL AB 3-0 PS2 27 (SUTURE) IMPLANT
SUT PROLENE 4 0 RB 1 (SUTURE) ×1
SUT PROLENE 4-0 RB1 .5 CRCL 36 (SUTURE) ×1 IMPLANT
SUT PROLENE 5 0 RB 1 DA (SUTURE) ×2 IMPLANT
SUT SILK 0 (SUTURE)
SUT SILK 0 30XBRD TIE 6 (SUTURE) IMPLANT
SUT SILK 1 SH (SUTURE) ×12 IMPLANT
SUT SILK 2 0 (SUTURE) ×2
SUT SILK 2-0 18XBRD TIE 12 (SUTURE) ×1 IMPLANT
SUT SILK 2-0 30XBRD TIE 12 (SUTURE) ×1 IMPLANT
SUT VIC AB 0 CT1 36 (SUTURE) IMPLANT
SUT VIC AB 2-0 CT1 27 (SUTURE) ×2
SUT VIC AB 2-0 CT1 TAPERPNT 27 (SUTURE) ×2 IMPLANT
SUT VICRYL 2 TP 1 (SUTURE) ×6 IMPLANT
SYR 10ML SLIP (SYRINGE) ×2 IMPLANT
SYR 50ML LL SCALE MARK (SYRINGE) ×2 IMPLANT
SYR BULB IRRIG 60ML STRL (SYRINGE) ×2 IMPLANT
TAPE CLOTH 3X10 WHT NS LF (GAUZE/BANDAGES/DRESSINGS) ×2 IMPLANT
TAPE TRANSPORE STRL 2 31045 (GAUZE/BANDAGES/DRESSINGS) ×2 IMPLANT
TRAY FOLEY MTR SLVR 16FR STAT (SET/KITS/TRAYS/PACK) ×2 IMPLANT
TROCAR FLEXIPATH 20X80 (ENDOMECHANICALS) ×2 IMPLANT
TROCAR FLEXIPATH THORACIC 15MM (ENDOMECHANICALS) IMPLANT
TUBING CONNECTING 10 (TUBING) ×2 IMPLANT
WATER STERILE IRR 1000ML POUR (IV SOLUTION) ×2 IMPLANT
YANKAUER SUCT BULB TIP FLEX NO (MISCELLANEOUS) ×4 IMPLANT

## 2018-04-02 NOTE — OR Nursing (Signed)
Lab tech in for abo/rh draw 1028 am

## 2018-04-02 NOTE — Consult Note (Signed)
Name: Joanna Morris MRN: [id number] DOB: June 04, 1950    ADMISSION DATE:  04/02/2018 CONSULTATION DATE:  04/02/2018  REFERRING MD :  Dr. Genevive Bi  CHIEF COMPLAINT:  Status Post Thoracotomy & Right Upper Lobectomy  BRIEF PATIENT DESCRIPTION:  68 y.o. 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 Genevive Bi on 1/28.  She returns to ICU post procedure.   SIGNIFICANT EVENTS  04/02/18>> Right Thoracotomy and Right Upper Lobectomy  STUDIES:   CULTURES: MRSA PCR 1/28>> Negative  ANTIBIOTICS: Cefazolin 1/28>>  HISTORY OF PRESENT ILLNESS:   Joanna Morris is a 68 y.o. Female with a PMH as listed below who presented to [facility] on 04/02/18 for elective Right Thoracotomy and Right Upper Lobectomy performed by Dr. Genevive Bi.  She reports that she had an abnormal lung cancer screening CT in December 2019, which led to subsequent referral for PET scan of which was markedly positive in the Right Upper Lobe. 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 Genevive Bi with Cardiothoracic Surgery. Pulmonary function studies were performed which revealed a FEV1 and DLCO of approximately 80%.  She returns to ICU post procedure on 1/28.  PCCM is consulted for medical management while she is in ICU.  PAST MEDICAL HISTORY :   has a past medical history of Allergy, Cataract, Genital warts, GERD (gastroesophageal reflux disease), History of kidney stones, Hyperlipidemia, Hypertension, Lung nodule, and Tobacco dependency.  has a past surgical history that includes Abdominal hysterectomy; Cesarean section; Knee arthroscopy with medial menisectomy (Right, 05/22/2016); and Oophorectomy. Prior to Admission medications   Medication Sig Start Date End Date Taking? Authorizing Provider  aspirin EC 81 MG tablet Take 1 tablet (81 mg total) by mouth daily. 12/27/16  Yes Karamalegos, Devonne Doughty, DO    Calcium Carbonate-Vitamin D (CALCIUM 600/VITAMIN D PO) Take 1 tablet by mouth daily.   Yes [provider]  Cholecalciferol (VITAMIN D3) 1000 units CAPS Take 1,000 Units by mouth daily.   Yes [provider]  hydrochlorothiazide (MICROZIDE) 12.5 MG capsule Take 1 capsule (12.5 mg total) by mouth daily. 06/28/17  Yes Karamalegos, Devonne Doughty, DO  rosuvastatin (CRESTOR) 5 MG tablet Take 1 tablet (5 mg total) by mouth at bedtime. 06/28/17  Yes Karamalegos, Devonne Doughty, DO  potassium chloride SA (K-DUR,KLOR-CON) 20 MEQ tablet Take 1 tablet (20 mEq total) by mouth 2 (two) times daily. 03/26/18   Nestor Lewandowsky, MD  varenicline (CHANTIX PAK) 0.5 MG X 11 & 1 MG X 42 tablet Take one 0.5 mg tab by mouth once daily for 3 days, increase to one 0.5 mg twice daily for 4 days, then increase to one 1 mg twice daily. Patient not taking: Reported on 04/02/2018 01/16/18   Olin Hauser, DO   Allergies  Allergen Reactions  . Tramadol Rash    FAMILY HISTORY:  family history includes Diabetes in her mother; Heart attack (age of onset: 75) in her mother; Heart disease (age of onset: 7) in her mother; Ovarian cancer in her sister; Pancreatic cancer in her sister. SOCIAL HISTORY:  reports that she has been smoking cigarettes. She has a 51.00 pack-year smoking history. She has never used smokeless tobacco. She reports current alcohol use. She reports that she does not use drugs.  REVIEW OF SYSTEMS:  Positives in BOLD : Pt denies all complaints Constitutional: Negative for fever, chills, weight loss, malaise/fatigue and diaphoresis.  HENT: Negative  for hearing loss, ear pain, nosebleeds, congestion, sore throat, neck pain, tinnitus and ear discharge.   Eyes: Negative for blurred vision, double vision, photophobia, pain, discharge and redness.  Respiratory: Negative for cough, hemoptysis, sputum production, shortness of breath, wheezing and stridor.   Cardiovascular: Negative for chest pain,  palpitations, orthopnea, claudication, leg swelling and PND.  Gastrointestinal: Negative for heartburn, nausea, vomiting, abdominal pain, diarrhea, constipation, blood in stool and melena.  Genitourinary: Negative for dysuria, urgency, frequency, hematuria and flank pain.  Musculoskeletal: Negative for myalgias, back pain, joint pain and falls.  Skin: Negative for itching and rash.  Neurological: Negative for dizziness, tingling, tremors, sensory change, speech change, focal weakness, seizures, loss of consciousness, weakness and headaches.  Endo/Heme/Allergies: Negative for environmental allergies and polydipsia. Does not bruise/bleed easily.  SUBJECTIVE:  Pt denies shortness of breath, wheezing, cough, sputum production Denies chest pain or pain at surgical site No fever or chills Pt on 3L Nasal cannula  VITAL SIGNS: Temp:  [97.1 F (36.2 C)-98.7 F (37.1 C)] 98.7 F (37.1 C) (01/28 1924) Pulse Rate:  [67-79] 67 (01/28 2000) Resp:  [12-26] 16 (01/28 2000) BP: (88-148)/(60-92) 107/70 (01/28 2000) SpO2:  [88 %-100 %] 95 % (01/28 2000) Weight:  [87.5 kg-89.9 kg] 89.9 kg (01/28 1840)  PHYSICAL EXAMINATION: General:  Acutely ill appearing female, laying in bed, asleep, on nasal cannula, in NAD Neuro:  Arouses to voice, A&O x4, follows commands, no focal deficits, speech clear HEENT:  Atraumatic, normocephalic, neck supple, no JVD Cardiovascular:  RRR, s1s2, no M/R/G, 2+ pulses throughout Lungs:  Clear to auscultation bilaterally, even, nonlabored, normal effort Abdomen:  Obese, soft, nontender, nondistended, no guarding or rebound tenderness, BS Hypoactive Musculoskeletal:  Normal bulk and tone, no deformities, no edema Skin:  Warm/dry. No obvious rashes, lesions, or ulcerations. 2 Chest tubes to right Midaxillary chest, dressings clean dry and intact  Recent Labs  Lab 04/02/18 1045 04/02/18 1854  NA 142 135  K 4.3 3.7  CL  --  104  CO2  --  24  BUN  --  13  CREATININE  --   0.75  GLUCOSE 91 180*   Recent Labs  Lab 04/02/18 1045 04/02/18 1854  HGB 15.3* 14.4  HCT 45.0 44.7  WBC  --  8.9  PLT  --  132*   Dg Chest Port 1 View  Result Date: 04/02/2018 CLINICAL DATA:  Postop right thoracic surgery. EXAM: PORTABLE CHEST 1 VIEW COMPARISON:  03/26/2018 FINDINGS: Right lung resection has been performed since the earlier exam. Previously seen nodules been removed. There is a pulmonary anastomosis staple line along the right mid to upper lung. There are 2 right-sided chest tubes, 1 with its tip at the right apex the other directed inferiorly. Lungs are essentially clear with no evidence of pulmonary edema. No significant atelectasis. Heart is mildly enlarged.  No mediastinal widening. No evidence of a pneumothorax. There is soft tissue air from the right lateral chest to the right neck base. IMPRESSION: 1. Status post right thoracic surgery with resection of right lung nodules. 2. Right chest tubes are well positioned. No evidence of a pneumothorax. 3. No evidence of an operative complication. Electronically Signed   By: Lajean Manes M.D.   On: 04/02/2018 19:12    ASSESSMENT / PLAN:  RUL Mass s/p Right Thoracotomy & Right Upper Lobectomy Hx: COPD, smoker -Supplemental O2 as needed to maintain O2 sats 88 to 94% -Cardiothoracic Surgery following, appreciate input -Chest tube as per Cardiothoracic surgery -Follow  intermittent CXR as needed -Prn Bronchodilators -Incentive spirometry -Pain control with PRN Morphine and Oxycodone -Encourage smoking cessation  Hx: HTN, HLD -Continue home HCTZ & Crestor  Hyperglycemia -CBG's -SSI -Follow ICU Hypo/Hyperglycemia protocol -Check Hemoglobin A1c   DISPOSITION: ICU GOALS OF CARE: Full Code VTE PROPHYLAXIS: SCD's UPDATES: Updated pt at bedside 04/02/18  Darel Hong, [facility] [place] [facility] Pager: [phone] Cell: [phone]  04/02/2018, 8:41 PM

## 2018-04-02 NOTE — Anesthesia Procedure Notes (Signed)
Procedure Name: Intubation Date/Time: 04/02/2018 12:56 PM Performed by: Doreen Salvage, CRNA Pre-anesthesia Checklist: Patient identified, Patient being monitored, Timeout performed, Emergency Drugs available and Suction available Patient Re-evaluated:Patient Re-evaluated prior to induction Oxygen Delivery Method: Circle system utilized Preoxygenation: Pre-oxygenation with 100% oxygen Induction Type: IV induction Ventilation: Mask ventilation without difficulty Laryngoscope Size: 3 and McGraph Grade View: Grade I Endobronchial tube: Double lumen EBT, EBT position confirmed by fiberoptic bronchoscope and EBT position confirmed by auscultation and 37 Fr Number of attempts: 2 (1st attempt Mil3; unable to visualize through copious secretions) Airway Equipment and Method: Stylet Placement Confirmation: ETT inserted through vocal cords under direct vision,  positive ETCO2 and breath sounds checked- equal and bilateral Secured at: 28 cm Tube secured with: Tape Dental Injury: Teeth and Oropharynx as per pre-operative assessment

## 2018-04-02 NOTE — Transfer of Care (Cosign Needed)
Immediate Anesthesia Transfer of Care Note  Patient: Joanna Morris  Procedure(s) Performed: THORACOTOMY AND LUNG RESECTION WITH PREOP BRONCH (Right )  Patient Location: PACU  Anesthesia Type:General  Level of Consciousness: awake and alert   Airway & Oxygen Therapy: Patient Spontanous Breathing and Patient connected to face mask oxygen  Post-op Assessment: Report given to RN and Post -op Vital signs reviewed and stable  Post vital signs: Reviewed and stable  Last Vitals:  Vitals Value Taken Time  BP 107/72 04/02/2018  5:38 PM  Temp 36.3 C 04/02/2018  5:38 PM  Pulse 80 04/02/2018  5:42 PM  Resp 23 04/02/2018  5:42 PM  SpO2 97 % 04/02/2018  5:42 PM  Vitals shown include unvalidated device data.  Last Pain:  Vitals:   04/02/18 1738  TempSrc: Temporal  PainSc:          Complications: No apparent anesthesia complications

## 2018-04-02 NOTE — Anesthesia Post-op Follow-up Note (Cosign Needed)
Anesthesia QCDR form completed.        

## 2018-04-02 NOTE — Interval H&P Note (Signed)
History and Physical Interval Note:  04/02/2018 12:03 PM  Joanna Morris  has presented today for surgery, with the diagnosis of RUL MASS  The various methods of treatment have been discussed with the patient and family. After consideration of risks, benefits and other options for treatment, the patient has consented to  Procedure(s): THORACOTOMY AND LUNG RESECTION WITH PREOP BRONCH (Right) as a surgical intervention .  The patient's history has been reviewed, patient examined, no change in status, stable for surgery.  I have reviewed the patient's chart and labs.  Questions were answered to the patient's satisfaction.     Nestor Lewandowsky

## 2018-04-02 NOTE — Anesthesia Preprocedure Evaluation (Signed)
Anesthesia Evaluation  Patient identified by MRN, date of birth, ID band Patient awake    Reviewed: Allergy & Precautions, NPO status , Patient's Chart, lab work & pertinent test results  History of Anesthesia Complications Negative for: history of anesthetic complications  Airway Mallampati: III       Dental  (+) Dental Advidsory Given, Edentulous Upper, Upper Dentures, Partial Lower, Missing   Pulmonary neg shortness of breath, neg COPD, neg recent URI, Current Smoker,           Cardiovascular Exercise Tolerance: Good hypertension, Pt. on medications (-) angina(-) CAD, (-) Past MI, (-) Cardiac Stents and (-) CABG (-) dysrhythmias (-) Valvular Problems/Murmurs     Neuro/Psych negative neurological ROS     GI/Hepatic Neg liver ROS, GERD  ,  Endo/Other  negative endocrine ROS  Renal/GU Renal disease (kidney stones)     Musculoskeletal   Abdominal   Peds  Hematology negative hematology ROS (+)   Anesthesia Other Findings Past Medical History: No date: Allergy No date: Cataract No date: Genital warts No date: GERD (gastroesophageal reflux disease)     Comment:  H/O No date: History of kidney stones No date: Hyperlipidemia No date: Hypertension No date: Lung nodule No date: Tobacco dependency   Reproductive/Obstetrics                             Anesthesia Physical  Anesthesia Plan  ASA: II  Anesthesia Plan: General   Post-op Pain Management:    Induction: Intravenous  PONV Risk Score and Plan: 2 and Ondansetron, Dexamethasone and Treatment may vary due to age or medical condition  Airway Management Planned: Double Lumen EBT  Additional Equipment:   Intra-op Plan:   Post-operative Plan: Extubation in OR  Informed Consent: I have reviewed the patients History and Physical, chart, labs and discussed the procedure including the risks, benefits and alternatives for the  proposed anesthesia with the patient or authorized representative who has indicated his/her understanding and acceptance.       Plan Discussed with:   Anesthesia Plan Comments:         Anesthesia Quick Evaluation

## 2018-04-02 NOTE — Anesthesia Post-op Follow-up Note (Signed)
Anesthesia QCDR form completed.        

## 2018-04-02 NOTE — Op Note (Signed)
04/02/2018  5:32 PM  PATIENT:  Joanna Morris  68 y.o. female  PRE-OPERATIVE DIAGNOSIS: Right upper lobe mass X2  POST-OPERATIVE DIAGNOSIS: Right upper lobe mass x2  PROCEDURE: Preoperative bronchoscopy with right thoracotomy and right upper lobectomy with mediastinal lymphadenectomy  SURGEON:  Surgeon(s) and Role:    * Nestor Lewandowsky, MD - Primary    * Pabon, Diego F, MD - Assisting  ASSISTANTS: Dr. Marlis Edelson  ANESTHESIA: General endotracheal anesthesia  INDICATIONS FOR PROCEDURE this patient is a 68 year old woman with a recent diagnosis of a right upper lobe PET positive spiculated lesion as well as a right upper lobe smooth bordered lesion that was PET negative.  Because it was felt that the right upper lobe lesion was almost certainly a malignancy she was offered the above named procedure for definitive diagnosis and therapy.  The indications and risks of surgery were explained the patient gave her informed consent.  DICTATION: The patient was brought to the operating suite and placed in the supine position.  General endotracheal anesthesia was given through a double-lumen tube.  Preoperative bronchoscopy was carried out and was normal to the subsegmental levels bilaterally.  There were extensive mucoid secretions throughout the lungs but there is no evidence of tumor, purulent secretions or blood.  The tube was secured in proper position and the patient was turned for a right thoracotomy.  All pressure points were carefully padded.  The patient was prepped and draped in usual sterile fashion.  A posterior lateral fifth interspace thoracotomy was performed.  The latissimus and serratus muscles were both divided.  The chest was entered.  There were extensive adhesions to the upper lobe to the underside of the chest wall throughout.  These were not just over the palpable tumor in the right upper lobe but were throughout the costal surface of the right upper lobe.  Once this was done  we were able to see that there were indeed 2 lesions in the right upper lobe.  Because of their location it did not appear possible to excise the tumor for definitive diagnosis so we proceeded on with a right upper lobectomy.  The fissure between the upper and middle lobe and between the upper and lower lobe were completed using electrocautery.  We identified the pulmonary artery in the depths of the fissure and then traced the veins over to the hilum.  In doing so we encountered the posterior ascending branch.  There were actually 3 branches to the upper lobe and these were all secured with the vascular stapler.  There was a small bleeding point on the posterior ascending branch and we secured this with a 4-0 Prolene.  The superior pulmonary vein was identified in the middle lobe vein was kept out of harm's way and the vein was divided as well.  The only remaining structure at this point was the bronchus.  Some of the lymph nodes along the bronchus were swept up into the specimen and then the bronchus was occluded with a TX stapler.  The middle lower lobe is ventilated nicely and the stapler was fired and the right upper lobe was transected.  In order to prevent torsion of the middle lobe the middle lower lobes were secured with a single staple line.  The bronchus was then checked at 30 cm of water pressure and there is no air leak.  The branches of the pulmonary artery and veins were again inspected and hemostasis was complete.  We then turned our attention to  placing her chest tubes.  An angled and straight chest tube were placed in the usual standard fashion.  They were brought out through separate stab wounds.  The chest was then closed.  #2 Vicryl pericostal sutures were used to approximate the ribs.  The serratus and latissimus muscles were approximated with #2 Vicryl.  The subcutaneous tissues were closed with 2-0 Vicryl and the skin with skin clips.  Our access incision was closed with a short running  nylon suture.  Sterile dressings were applied.  The patient was then rolled into the supine position where she was extubated and taken to the recovery room in stable condition.  All sponge needle and instrument counts were correct as reported to me at the end of the case   Nestor Lewandowsky, MD

## 2018-04-03 ENCOUNTER — Inpatient Hospital Stay: Payer: Medicare Other

## 2018-04-03 ENCOUNTER — Encounter: Payer: Self-pay | Admitting: Cardiothoracic Surgery

## 2018-04-03 DIAGNOSIS — R918 Other nonspecific abnormal finding of lung field: Secondary | ICD-10-CM

## 2018-04-03 LAB — GLUCOSE, CAPILLARY
Glucose-Capillary: 163 mg/dL — ABNORMAL HIGH (ref 70–99)
Glucose-Capillary: 142 mg/dL — ABNORMAL HIGH (ref 70–99)
Glucose-Capillary: 111 mg/dL — ABNORMAL HIGH (ref 70–99)
Glucose-Capillary: 105 mg/dL — ABNORMAL HIGH (ref 70–99)
Glucose-Capillary: 125 mg/dL — ABNORMAL HIGH (ref 70–99)

## 2018-04-03 LAB — HEMOGLOBIN A1C
Hgb A1c MFr Bld: 6.2 % — ABNORMAL HIGH (ref 4.8–5.6)
Mean Plasma Glucose: 131.24 mg/dL

## 2018-04-03 MED ORDER — ALBUTEROL SULFATE (2.5 MG/3ML) 0.083% IN NEBU
2.5000 mg | INHALATION_SOLUTION | Freq: Three times a day (TID) | RESPIRATORY_TRACT | Status: DC
  Administered 2018-04-03 – 2018-04-05 (×6): 2.5 mg via RESPIRATORY_TRACT
  Filled 2018-04-03 (×6): qty 3

## 2018-04-03 NOTE — Anesthesia Postprocedure Evaluation (Signed)
Anesthesia Post Note  Patient: MILLA WAHLBERG  Procedure(s) Performed: THORACOTOMY AND LUNG RESECTION WITH PREOP BRONCH (Right )  Patient location during evaluation: ICU Anesthesia Type: General Level of consciousness: awake, awake and alert and oriented Pain management: pain level controlled Vital Signs Assessment: post-procedure vital signs reviewed and stable Respiratory status: spontaneous breathing, nonlabored ventilation and respiratory function stable Cardiovascular status: blood pressure returned to baseline and stable Postop Assessment: adequate PO intake and no apparent nausea or vomiting Anesthetic complications: no     Last Vitals:  Vitals:   04/03/18 0600 04/03/18 0737  BP: 121/72   Pulse: 66 68  Resp: 16 18  Temp:    SpO2: 97% 96%    Last Pain:  Vitals:   04/03/18 0402  TempSrc:   PainSc: Asleep                 Johnna Acosta

## 2018-04-03 NOTE — Progress Notes (Signed)
Being being transferred to room 208. Report called to [place], Therapist, sports. Pt and belongings transferred to room 208 without incident.

## 2018-04-03 NOTE — Progress Notes (Signed)
Per zack pt okay for pt to be water seal when ambulating. Also pt chest tube needs to be at 40 to wall suction.

## 2018-04-03 NOTE — Evaluation (Signed)
Physical Therapy Evaluation Patient Details Name: LEXY MEININGER MRN: [id number] DOB: 22-Dec-1950 Today's Date: 04/03/2018   History of Present Illness  Pt is a 68 year old female s/p R thoracotomy with lung resection.  PMH includes Htn, HLD and cataracts.  Clinical Impression  Pt is a 68 year old female who lives in an apartment alone.  She is independent with mobility at baseline.  Pt did not give a pain report but appeared to be in pain, grimacing throughout session.  She presented with fair UE/LE strength though limited by pain.  PT provided education concerning log roll technique and pt was able to get to bedside with handheld min A.  PT and pt's brother provided +2 assist for management of equipment and safety and pt was able to rise from elevated bedside with CGA.  Education provided for use of RW and pt ambulated 20 ft around bed to recliner, demonstrating gait deviations that indicate fall risk and posturing to manage pain.  Pt on 2L of O2 throughout session and with good vitals.  Nurse tech in to check vitals during evaluation.  Pt will continue to benefit from skilled PT with focus on strength, tolerance to activity, functional mobility and pain management.    Follow Up Recommendations Home health PT    Equipment Recommendations  Rolling walker with 5" wheels    Recommendations for Other Services       Precautions / Restrictions Precautions Precautions: Fall Restrictions Weight Bearing Restrictions: No      Mobility  Bed Mobility Overal bed mobility: Needs Assistance Bed Mobility: Rolling;Sidelying to Sit Rolling: Min assist Sidelying to sit: Min assist       General bed mobility comments: Education regarding log rolling technique and min A to sit upright  Transfers Overall transfer level: Needs assistance Equipment used: Rolling walker (2 wheeled) Transfers: Sit to/from Stand Sit to Stand: +2 safety/equipment;From elevated surface         General transfer  comment: Pt brother assisted with equipment and CGA for safety. Pt able to rise from bedside slowly without physical assist.  Ambulation/Gait Ambulation/Gait assistance: Min guard;+2 safety/equipment Gait Distance (Feet): 20 Feet Assistive device: Rolling walker (2 wheeled)     Gait velocity interpretation: <1.8 ft/sec, indicate of risk for recurrent falls General Gait Details: Slow step through gait, low foot clearance and decreased step length; good management of RW with education, pt's brother assisted with IV pole.  Chest tube placed on waterseal.  Stairs            Wheelchair Mobility    Modified Rankin (Stroke Patients Only)       Balance Overall balance assessment: Modified Independent                                           Pertinent Vitals/Pain Pain Assessment: (When asked pain rating, pt stated "I don't know".)    Home Living Family/patient expects to be discharged to:: Private residence Living Arrangements: Alone Available Help at Discharge: Family;Available 24 hours/day(Pt's brother) Type of Home: Apartment Home Access: Level entry     Home Layout: One level Home Equipment: None      Prior Function Level of Independence: Independent         Comments: Community ambulator     Journalist, newspaper        Extremity/Trunk Assessment   Upper Extremity Assessment Upper Extremity  Assessment: (Able to manage RW, good grip strength.  Shoulder strength testing limited by post-op pain.)    Lower Extremity Assessment Lower Extremity Assessment: Overall WFL for tasks assessed(Grossly 4/5 bilaterally.)    Cervical / Trunk Assessment Cervical / Trunk Assessment: Kyphotic(Pt also posturing to avoid pain.)  Communication   Communication: No difficulties  Cognition Arousal/Alertness: Awake/alert Behavior During Therapy: Restless Overall Cognitive Status: Within Functional Limits for tasks assessed                                  General Comments: Follows directions consistently      General Comments      Exercises     Assessment/Plan    PT Assessment Patient needs continued PT services  PT Problem List Decreased strength;Decreased mobility;Decreased activity tolerance;Decreased balance;Decreased knowledge of use of DME;Pain       PT Treatment Interventions DME instruction;Functional mobility training;Balance training;Patient/family education;Gait training;Therapeutic activities;Stair training;Therapeutic exercise    PT Goals (Current goals can be found in the Care Plan section)  Acute Rehab PT Goals Patient Stated Goal: To go home and continue general daily activity. PT Goal Formulation: With patient Time For Goal Achievement: 04/17/18 Potential to Achieve Goals: Good    Frequency Min 2X/week   Barriers to discharge        Co-evaluation               AM-PAC PT "6 Clicks" Mobility  Outcome Measure Help needed turning from your back to your side while in a flat bed without using bedrails?: A Little Help needed moving from lying on your back to sitting on the side of a flat bed without using bedrails?: A Lot Help needed moving to and from a bed to a chair (including a wheelchair)?: A Little Help needed standing up from a chair using your arms (e.g., wheelchair or bedside chair)?: A Little Help needed to walk in hospital room?: A Little Help needed climbing 3-5 steps with a railing? : A Little 6 Click Score: 17    End of Session   Activity Tolerance: Patient limited by pain Patient left: in chair;with chair alarm set;with call bell/phone within reach;with family/visitor present Nurse Communication: Mobility status PT Visit Diagnosis: Unsteadiness on feet (R26.81);Other abnormalities of gait and mobility (R26.89);Muscle weakness (generalized) (M62.81)    Time: 2924-4628 PT Time Calculation (min) (ACUTE ONLY): 28 min   Charges:   PT Evaluation $PT Eval Low Complexity: 1  Low PT Treatments $Therapeutic Activity: 8-22 mins        Roxanne Gates, PT, DPT   Roxanne Gates 04/03/2018, 12:58 PM

## 2018-04-03 NOTE — Progress Notes (Addendum)
A small Right apical pneumothorax is noted on  this morning's CXR.  There is no evidence of tension PTX on CXR.   Pt's vital signs remain stable and unchanged.  Chest tube remains intact, no air leak noted.  Will repeat CXR later this morning to reassess PTX.  Have notified Dr. Genevive Bi in [place] in Epic of new PTX.   Darel Hong, AGACNP-BC [facility] Pager: [phone] Cell: [phone]

## 2018-04-03 NOTE — Progress Notes (Signed)
Per MD okay for RN to DC iv fluids.

## 2018-04-04 ENCOUNTER — Inpatient Hospital Stay: Payer: Medicare Other

## 2018-04-04 LAB — GLUCOSE, CAPILLARY
GLUCOSE-CAPILLARY: 127 mg/dL — AB (ref 70–99)
Glucose-Capillary: 150 mg/dL — ABNORMAL HIGH (ref 70–99)
Glucose-Capillary: 124 mg/dL — ABNORMAL HIGH (ref 70–99)

## 2018-04-04 LAB — URINALYSIS, ROUTINE W REFLEX MICROSCOPIC
Bacteria, UA: NONE SEEN
Bilirubin Urine: NEGATIVE
Glucose, UA: NEGATIVE mg/dL
KETONES UR: NEGATIVE mg/dL
Leukocytes, UA: NEGATIVE
Nitrite: NEGATIVE
Protein, ur: NEGATIVE mg/dL
Specific Gravity, Urine: 1.016 (ref 1.005–1.030)
Squamous Epithelial / HPF: NONE SEEN (ref 0–5)
pH: 5 (ref 5.0–8.0)

## 2018-04-04 LAB — CBC
HCT: 45.8 % (ref 36.0–46.0)
Hemoglobin: 14.5 g/dL (ref 12.0–15.0)
MCH: 29.4 pg (ref 26.0–34.0)
MCHC: 31.7 g/dL (ref 30.0–36.0)
MCV: 92.9 fL (ref 80.0–100.0)
NRBC: 0 % (ref 0.0–0.2)
Platelets: 136 10*3/uL — ABNORMAL LOW (ref 150–400)
RBC: 4.93 MIL/uL (ref 3.87–5.11)
RDW: 12.6 % (ref 11.5–15.5)
WBC: 18.9 10*3/uL — ABNORMAL HIGH (ref 4.0–10.5)

## 2018-04-04 LAB — TYPE AND SCREEN
ABO/RH(D): B POS
Antibody Screen: NEGATIVE
Unit division: 0
Unit division: 0

## 2018-04-04 LAB — BASIC METABOLIC PANEL
Anion gap: 6 (ref 5–15)
BUN: 16 mg/dL (ref 8–23)
CHLORIDE: 100 mmol/L (ref 98–111)
CO2: 32 mmol/L (ref 22–32)
Calcium: 8.9 mg/dL (ref 8.9–10.3)
Creatinine, Ser: 1.05 mg/dL — ABNORMAL HIGH (ref 0.44–1.00)
GFR calc Af Amer: >60 mL/min (ref >60)
GFR calc non Af Amer: 54 mL/min — ABNORMAL LOW (ref >60)
Glucose, Bld: 131 mg/dL — ABNORMAL HIGH (ref 70–99)
Potassium: 4.4 mmol/L (ref 3.5–5.1)
Sodium: 138 mmol/L (ref 135–145)

## 2018-04-04 LAB — PREPARE RBC (CROSSMATCH)

## 2018-04-04 LAB — BPAM RBC
Blood Product Expiration Date: 202002232359
Blood Product Expiration Date: 202002232359
Unit Type and Rh: 5100
Unit Type and Rh: 5100

## 2018-04-04 MED ORDER — SODIUM CHLORIDE 0.45 % IV SOLN
INTRAVENOUS | Status: DC
  Administered 2018-04-04 – 2018-04-05 (×2): via INTRAVENOUS

## 2018-04-04 MED ORDER — ACETAMINOPHEN 500 MG PO TABS
1000.0000 mg | ORAL_TABLET | Freq: Four times a day (QID) | ORAL | Status: DC | PRN
  Administered 2018-04-06 – 2018-04-09 (×6): 1000 mg via ORAL
  Filled 2018-04-04 (×6): qty 2

## 2018-04-04 MED ORDER — ACETAMINOPHEN 325 MG PO TABS
650.0000 mg | ORAL_TABLET | Freq: Four times a day (QID) | ORAL | Status: DC | PRN
  Administered 2018-04-04: 650 mg via ORAL
  Filled 2018-04-04: qty 2

## 2018-04-04 MED ORDER — OXYCODONE-ACETAMINOPHEN 5-325 MG PO TABS
1.0000 | ORAL_TABLET | ORAL | Status: DC | PRN
  Administered 2018-04-04: 2 via ORAL
  Filled 2018-04-04: qty 2

## 2018-04-04 MED ORDER — IBUPROFEN 400 MG PO TABS
600.0000 mg | ORAL_TABLET | Freq: Three times a day (TID) | ORAL | Status: DC | PRN
  Administered 2018-04-04: 600 mg via ORAL
  Filled 2018-04-04: qty 2

## 2018-04-04 MED ORDER — METHOCARBAMOL 500 MG PO TABS
500.0000 mg | ORAL_TABLET | Freq: Four times a day (QID) | ORAL | Status: DC
  Administered 2018-04-04 – 2018-04-10 (×26): 500 mg via ORAL
  Filled 2018-04-04 (×28): qty 1

## 2018-04-04 MED ORDER — SODIUM CHLORIDE 0.45 % IV SOLN: INTRAVENOUS | Status: DC

## 2018-04-04 MED ORDER — OXYCODONE HCL 5 MG PO TABS
5.0000 mg | ORAL_TABLET | ORAL | Status: DC | PRN
  Administered 2018-04-04 (×2): 10 mg via ORAL
  Filled 2018-04-04 (×2): qty 2

## 2018-04-04 MED ORDER — IBUPROFEN 100 MG PO CHEW: 600.0000 mg | CHEWABLE_TABLET | Freq: Three times a day (TID) | ORAL | Status: DC | PRN

## 2018-04-04 NOTE — Progress Notes (Addendum)
Rn notified Zack PA about pt having a temp of 101.9. per PA okay for RN to order 650 mg PO tylenol PRN Q6. Also order labs. Also notified zack pt was having problems peeing. Orders given to bladder scan pt and in and out if needed.

## 2018-04-04 NOTE — Progress Notes (Addendum)
Brief Progress Note  Subjective  Called by patient's RN regarding the patient developing fever to 102 and tachycardia to 110 this afternoon. Patient notes that "her breathing feels fine." No complaints. Puling <500 on incentive spirometer. Additionally, patient unable to void with ~900 on bladder scan.   Objective BP 136/69   Pulse (!) 105   Temp (!) 102 F (38.9 C) (Oral)   Resp 16   Ht 5\' 1"  (1.549 m)   Wt 89.9 kg   SpO2 100%   BMI 37.45 kg/m     Tachycardic but regular Lung sounds are distant and she again has very poor inspiratory effort Chest tube output still serosanguinous   CBC showing leukocytosis to 18.9  BMP with slight sCr bump to 1.05  UA unremarkable Repeat CXR shows bibasilar atelectasis   A/P Fever and tachycardia most likely attributable to atelectasis secondary to poor inspiratory effort and lack of infectious source.   - Added tylenol/Ibuprofen for fever  - Will hold off on empiric ABx unless source identified   - Restart IVF (0.45% NS at 50 ml/hr)  - Follow up labs  - Aggressive pulmonary toilet  - In & Out catheter for retention, will monitor  - Monitor fever, vitals, chest tube output   Signed:  Edison Simon, PA-C [facility] 04/04/2018, 2:11 PM [phone] M-F: 7am - 4pm

## 2018-04-04 NOTE — Progress Notes (Signed)
Patient ID: Joanna Morris, female   DOB: 07-19-1950, 68 y.o.   MRN: [id number]    Her pain is under good control.  She is not short of breath.  She states she does have some discomfort when she coughs.  She ate well yesterday.  She has a very small air leak today on 40 cm water suction.  We repeated her chest x-ray today and there are very low lung volumes bilaterally with no evidence of pneumothorax.  We will decrease her suction to 20.  Her lungs are equal but distant bilaterally.  Her heart is regular.  Her dressings were all changed today and her wounds look clean and dry.  We will encourage her to ambulate in the halls.  We will continue her incentive spirometry.  We will ask our social workers to see the patient to assess whether or not she needs any further assistance when she goes home.

## 2018-04-04 NOTE — Progress Notes (Signed)
Per PA okay for RN to order ibuprofen 600 mg PRN Q6. For fever.

## 2018-04-05 ENCOUNTER — Inpatient Hospital Stay: Payer: Medicare Other

## 2018-04-05 ENCOUNTER — Encounter: Payer: Self-pay | Admitting: Internal Medicine

## 2018-04-05 LAB — BASIC METABOLIC PANEL
Anion gap: 7 (ref 5–15)
Anion gap: 6 (ref 5–15)
BUN: 22 mg/dL (ref 8–23)
BUN: 29 mg/dL — AB (ref 8–23)
CHLORIDE: 99 mmol/L (ref 98–111)
CO2: 31 mmol/L (ref 22–32)
CO2: 32 mmol/L (ref 22–32)
Calcium: 8.6 mg/dL — ABNORMAL LOW (ref 8.9–10.3)
Calcium: 8.5 mg/dL — ABNORMAL LOW (ref 8.9–10.3)
Chloride: 99 mmol/L (ref 98–111)
Creatinine, Ser: 1.58 mg/dL — ABNORMAL HIGH (ref 0.44–1.00)
Creatinine, Ser: 1.94 mg/dL — ABNORMAL HIGH (ref 0.44–1.00)
GFR calc Af Amer: 38 mL/min — ABNORMAL LOW (ref >60)
GFR calc Af Amer: 30 mL/min — ABNORMAL LOW (ref >60)
GFR calc non Af Amer: 33 mL/min — ABNORMAL LOW (ref >60)
GFR calc non Af Amer: 26 mL/min — ABNORMAL LOW (ref >60)
GLUCOSE: 115 mg/dL — AB (ref 70–99)
Glucose, Bld: 125 mg/dL — ABNORMAL HIGH (ref 70–99)
Potassium: 4.1 mmol/L (ref 3.5–5.1)
Potassium: 4 mmol/L (ref 3.5–5.1)
Sodium: 137 mmol/L (ref 135–145)
Sodium: 137 mmol/L (ref 135–145)

## 2018-04-05 LAB — COMPREHENSIVE METABOLIC PANEL
ALT: 40 U/L (ref 0–44)
AST: 60 U/L — ABNORMAL HIGH (ref 15–41)
Albumin: 2.9 g/dL — ABNORMAL LOW (ref 3.5–5.0)
Alkaline Phosphatase: 41 U/L (ref 38–126)
Anion gap: 5 (ref 5–15)
BUN: 33 mg/dL — ABNORMAL HIGH (ref 8–23)
CO2: 32 mmol/L (ref 22–32)
Calcium: 8.2 mg/dL — ABNORMAL LOW (ref 8.9–10.3)
Chloride: 100 mmol/L (ref 98–111)
Creatinine, Ser: 1.57 mg/dL — ABNORMAL HIGH (ref 0.44–1.00)
GFR calc Af Amer: 39 mL/min — ABNORMAL LOW (ref >60)
GFR calc non Af Amer: 33 mL/min — ABNORMAL LOW (ref >60)
Glucose, Bld: 130 mg/dL — ABNORMAL HIGH (ref 70–99)
Potassium: 4.1 mmol/L (ref 3.5–5.1)
SODIUM: 137 mmol/L (ref 135–145)
Total Bilirubin: 0.7 mg/dL (ref 0.3–1.2)
Total Protein: 6.3 g/dL — ABNORMAL LOW (ref 6.5–8.1)

## 2018-04-05 LAB — CBC
HCT: 43.7 % (ref 36.0–46.0)
HCT: 41.9 % (ref 36.0–46.0)
Hemoglobin: 13.8 g/dL (ref 12.0–15.0)
Hemoglobin: 13 g/dL (ref 12.0–15.0)
MCH: 29.5 pg (ref 26.0–34.0)
MCH: 29.3 pg (ref 26.0–34.0)
MCHC: 31.6 g/dL (ref 30.0–36.0)
MCHC: 31 g/dL (ref 30.0–36.0)
MCV: 93.4 fL (ref 80.0–100.0)
MCV: 94.4 fL (ref 80.0–100.0)
Platelets: 138 10*3/uL — ABNORMAL LOW (ref 150–400)
Platelets: 141 10*3/uL — ABNORMAL LOW (ref 150–400)
RBC: 4.68 MIL/uL (ref 3.87–5.11)
RBC: 4.44 MIL/uL (ref 3.87–5.11)
RDW: 12.4 % (ref 11.5–15.5)
RDW: 12 % (ref 11.5–15.5)
WBC: 19.2 10*3/uL — ABNORMAL HIGH (ref 4.0–10.5)
WBC: 13.2 10*3/uL — ABNORMAL HIGH (ref 4.0–10.5)
nRBC: 0 % (ref 0.0–0.2)
nRBC: 0 % (ref 0.0–0.2)

## 2018-04-05 LAB — SURGICAL PATHOLOGY

## 2018-04-05 MED ORDER — METOPROLOL TARTRATE 25 MG PO TABS
25.0000 mg | ORAL_TABLET | Freq: Two times a day (BID) | ORAL | Status: DC
  Administered 2018-04-05 – 2018-04-10 (×10): 25 mg via ORAL
  Filled 2018-04-05 (×11): qty 1

## 2018-04-05 MED ORDER — HEPARIN SODIUM (PORCINE) 5000 UNIT/ML IJ SOLN
5000.0000 [IU] | Freq: Three times a day (TID) | INTRAMUSCULAR | Status: DC
  Administered 2018-04-05 – 2018-04-10 (×15): 5000 [IU] via SUBCUTANEOUS
  Filled 2018-04-05 (×15): qty 1

## 2018-04-05 MED ORDER — SODIUM CHLORIDE 0.45 % IV BOLUS
250.0000 mL | Freq: Once | INTRAVENOUS | Status: AC
  Administered 2018-04-05: 250 mL via INTRAVENOUS

## 2018-04-05 MED ORDER — SODIUM CHLORIDE 0.9 % IV SOLN
INTRAVENOUS | Status: AC
  Administered 2018-04-05 – 2018-04-06 (×2): via INTRAVENOUS

## 2018-04-05 MED ORDER — OXYCODONE HCL 5 MG PO TABS
5.0000 mg | ORAL_TABLET | ORAL | Status: DC | PRN
  Administered 2018-04-05 – 2018-04-10 (×6): 10 mg via ORAL
  Filled 2018-04-05 (×6): qty 2

## 2018-04-05 MED ORDER — ALBUTEROL SULFATE (2.5 MG/3ML) 0.083% IN NEBU
2.5000 mg | INHALATION_SOLUTION | Freq: Two times a day (BID) | RESPIRATORY_TRACT | Status: DC
  Administered 2018-04-05 – 2018-04-10 (×10): 2.5 mg via RESPIRATORY_TRACT
  Filled 2018-04-05 (×10): qty 3

## 2018-04-05 MED ORDER — IPRATROPIUM-ALBUTEROL 0.5-2.5 (3) MG/3ML IN SOLN
3.0000 mL | Freq: Four times a day (QID) | RESPIRATORY_TRACT | Status: DC | PRN
  Administered 2018-04-05: 3 mL via RESPIRATORY_TRACT
  Filled 2018-04-05: qty 3

## 2018-04-05 NOTE — Progress Notes (Signed)
Went in to assess patient new drainage was present on bed pad and dressing. Dressing had pulled away and appeared chest tube was not draining anymore. Dr Hampton Abbot was paged and he said to reinforce dressing. Patient was not in any distress and she said she was not having any problems breathing.   New drainage was also noted at the beginning of the shift and dressing was reinforced then also.

## 2018-04-05 NOTE — Progress Notes (Signed)
PT Cancellation Note  Patient Details Name: Joanna Morris MRN: [id number] DOB: [birth year]   Cancelled Treatment:    Reason Eval/Treat Not Completed: Other (comment).  Nurse reports pt currently getting foley catheter placed and nurse reports pt recently ambulated around nursing loop with staff assist.  Will re-attempt PT treatment session at a later date/time.  Leitha Bleak, PT 04/05/18, 2:59 PM [phone]

## 2018-04-05 NOTE — Progress Notes (Signed)
CCMD called and patient had SVT episode and HR of 132. Vitals were taken 131/81 86 20 resp. Patient did not have any complaints and was not having any difficult breathing.  I paged Dr Hampton Abbot and he returned my page. He said just to continue to monitor patient.

## 2018-04-05 NOTE — Progress Notes (Signed)
Pt with increased confusion and restlessness, alert to name DOB answers questions then rambles at end of sentence. Talking without anyone in room. Wheezing noted SVN given discussed with Resp Therapy as well.

## 2018-04-05 NOTE — Progress Notes (Signed)
Patient ID: Joanna Morris, female   DOB: May 02, 1950, 68 y.o.   MRN: [id number]   She had a fever yesterday and a slightly elevated white blood cell count.  She is currently afebrile and states that she feels much better today.  She is not short of breath.  She has some discomfort when she is moving and sleeping in bed but otherwise her pain is under good control.  She states that she is hungry and ate well yesterday.  Her lungs show slightly diminished breath sounds on the right.  Her heart is irregular this morning.  She does have some serous drainage around the chest tube sites.  I changed her dressings again.  Her thoracotomy site is without erythema or drainage.  Her access incision has a slight amount of redness around the nylon sutures but this is only minimal.  I have independently reviewed her chest x-ray from this morning.  There remains a small pneumothorax.  I increase the suction back to 40 and there was a small amount of air that issued forth.  We will leave the chest tube on 40 cm of suction currently.  I have encouraged her to ambulate as much as possible to spend as much time out of bed if she can.  The pathology is still pending.  We will also repeat her laboratory studies today.

## 2018-04-05 NOTE — Progress Notes (Signed)
Patient had very little urine output over night bladder scan done and 356 ml was noted.

## 2018-04-05 NOTE — Care Management Note (Signed)
Case Management Note  Patient Details  Name: Joanna Morris MRN: [id number] Date of Birth: 14-Feb-1951   Patient status post right thoracotomy and right upper lobectomy with mediastinal lymphadenectomy.  Daughter in law at bedside.  Patient states that she lives at home alone.  Her brother lives across the street.  At baseline the patient is independent and drives.  No medical equipment in the home. PCP Krarmalegos.  Pharmacy [facility].  Patient denies issues obtaining her medications.    PT has assessed patient and recommended home health PT.  Patient is agreeable to services CMS Medicare.gov Compare Post Acute Care list reviewed with patient.  Patient states she does not have a preference of agency.  Heads up referral made to [facility] with [place] for home health and RW.  Patient currently requiring acute o2, and chest tubes still in place.   Subjective/Objective:                    Action/Plan:   Expected Discharge Date:  04/05/18               Expected Discharge Plan:     In-House Referral:     Discharge planning Services     Post Acute Care Choice:    Choice offered to:     DME Arranged:    DME Agency:     HH Arranged:    HH Agency:     Status of Service:     If discussed at [employer], dates discussed:    Additional Comments:  Beverly Sessions, RN 04/05/2018, 1:46 PM

## 2018-04-05 NOTE — Care Management Important Message (Signed)
Copy of signed Medicare IM left with patient in room. 

## 2018-04-05 NOTE — Consult Note (Signed)
[facility] [place]    MR#:  [id number]  DATE OF BIRTH:  September 05, 1950  DATE OF ADMISSION:  04/02/2018  PRIMARY CARE PHYSICIAN: Olin Hauser, DO   REQUESTING/REFERRING PHYSICIAN: Dr. Nestor Lewandowsky  CHIEF COMPLAINT:  No chief complaint on file.   HISTORY OF PRESENT ILLNESS:  Kinda Pottle  is a 68 y.o. female with a known history of ongoing smoking, hypertension, hyperlipidemia who was diagnosed to have a right upper lobe nodule concerning for malignancy got admitted for thoracotomy. Patient had right-sided thoracotomy with right upper lobectomy and mediastinal lymphadenectomy on 04/02/2018.  She currently has chest tubes for pneumothorax.  Pain is much improved today.  Hurts with movement.  Denies any chest pain, most of the pain is around her lobectomy surgical incision.  No palpitations.  No dyspnea. Medical consult has been requested for worsening renal function, and also sinus tachycardia with heart rate in the 110's. Patient denies any cardiac history.  No other complaints at this time  PAST MEDICAL HISTORY:   Past Medical History:  Diagnosis Date  . Allergy   . Cataract   . Genital warts   . GERD (gastroesophageal reflux disease)    H/O  . History of kidney stones   . Hyperlipidemia   . Hypertension   . Lung nodule   . Tobacco dependency     PAST SURGICAL HISTOIRY:   Past Surgical History:  Procedure Laterality Date  . ABDOMINAL HYSTERECTOMY    . CESAREAN SECTION     X2  . KNEE ARTHROSCOPY WITH MEDIAL MENISECTOMY Right 05/22/2016   Procedure: KNEE ARTHROSCOPY WITH PARTIAL MEDIAL MENISECTOMY;  Surgeon: Dereck Leep, MD;  Location: [facility] ORS;  Service: Orthopedics;  Laterality: Right;  . OOPHORECTOMY    . THORACOTOMY Right 04/02/2018   Procedure: THORACOTOMY AND LUNG RESECTION WITH PREOP BRONCH;  Surgeon: Nestor Lewandowsky, MD;  Location: [facility] ORS;  Service: General;  Laterality: Right;     SOCIAL HISTORY:   Social History   Tobacco Use  . Smoking status: Current Every Day Smoker    Packs/day: 1.00    Years: 51.00    Pack years: 51.00    Types: Cigarettes  . Smokeless tobacco: Never Used  . Tobacco comment: Quit once for 2 years. pt has rx for chantix  Substance Use Topics  . Alcohol use: Yes    Comment: half a pint of liquor every week    FAMILY HISTORY:   Family History  Problem Relation Age of Onset  . Heart disease Mother 43       MI  . Heart attack Mother 50  . Diabetes Mother   . Pancreatic cancer Sister   . Ovarian cancer Sister   . Breast cancer Neg Hx     DRUG ALLERGIES:   Allergies  Allergen Reactions  . Tramadol Rash    REVIEW OF SYSTEMS:   Review of Systems  Constitutional: Negative for chills, fever, malaise/fatigue and weight loss.  HENT: Negative for ear discharge, ear pain, hearing loss and nosebleeds.   Eyes: Negative for blurred vision, double vision and photophobia.  Respiratory: Negative for cough, hemoptysis, shortness of breath and wheezing.   Cardiovascular: Negative for chest pain, palpitations, orthopnea and leg swelling.  Gastrointestinal: Negative for abdominal pain, constipation, diarrhea, heartburn, melena, nausea and vomiting.  Genitourinary: Negative for dysuria, frequency, hematuria and urgency.  Musculoskeletal: Positive for back pain. Negative for myalgias and neck pain.  Skin: Negative  for rash.  Neurological: Negative for dizziness, tingling, tremors, sensory change, speech change, focal weakness and headaches.  Endo/Heme/Allergies: Does not bruise/bleed easily.  Psychiatric/Behavioral: Negative for depression. The patient is nervous/anxious.     MEDICATIONS AT HOME:   Prior to Admission medications   Medication Sig Start Date End Date Taking? Authorizing Provider  aspirin EC 81 MG tablet Take 1 tablet (81 mg total) by mouth daily. 12/27/16  Yes Karamalegos, Devonne Doughty, DO  Calcium Carbonate-Vitamin D  (CALCIUM 600/VITAMIN D PO) Take 1 tablet by mouth daily.   Yes [provider]  Cholecalciferol (VITAMIN D3) 1000 units CAPS Take 1,000 Units by mouth daily.   Yes [provider]  hydrochlorothiazide (MICROZIDE) 12.5 MG capsule Take 1 capsule (12.5 mg total) by mouth daily. 06/28/17  Yes Karamalegos, Devonne Doughty, DO  rosuvastatin (CRESTOR) 5 MG tablet Take 1 tablet (5 mg total) by mouth at bedtime. 06/28/17  Yes Karamalegos, Devonne Doughty, DO  potassium chloride SA (K-DUR,KLOR-CON) 20 MEQ tablet Take 1 tablet (20 mEq total) by mouth 2 (two) times daily. 03/26/18   Nestor Lewandowsky, MD  varenicline (CHANTIX PAK) 0.5 MG X 11 & 1 MG X 42 tablet Take one 0.5 mg tab by mouth once daily for 3 days, increase to one 0.5 mg twice daily for 4 days, then increase to one 1 mg twice daily. Patient not taking: Reported on 04/02/2018 01/16/18   Olin Hauser, DO      VITAL SIGNS:  Blood pressure (!) 115/93, pulse (!) 116, temperature 98.5 F (36.9 C), temperature source Oral, resp. rate 16, height 5\' 1"  (1.549 m), weight 89.9 kg, SpO2 92 %.  PHYSICAL EXAMINATION:   Physical Exam  GENERAL:  68 y.o.-year-old patient lying in the bed with no acute distress.  EYES: Pupils equal, round, reactive to light and accommodation. No scleral icterus. Extraocular muscles intact.  HEENT: Head atraumatic, normocephalic. Oropharynx and nasopharynx clear.  NECK:  Supple, no jugular venous distention. No thyroid enlargement, no tenderness.  LUNGS: Moving air, decreased in the right side significantly.  There is a scar on the right posterior side with staples in place. 2 chest tube coming from the right lower flank. -But air movement on the left side.  No rales,rhonchi or crepitation. No use of accessory muscles of respiration.  CARDIOVASCULAR: S1, S2 normal. No murmurs, rubs, or gallops.  ABDOMEN: Soft, nontender, nondistended. Bowel sounds present. No organomegaly or mass.  EXTREMITIES: No pedal  edema, cyanosis, or clubbing.  NEUROLOGIC: Cranial nerves II through XII are intact. Muscle strength 5/5 in all extremities. Sensation intact. Gait not checked.  PSYCHIATRIC: The patient is alert and oriented x 3.  SKIN: No obvious rash, lesion, or ulcer.   LABORATORY PANEL:   CBC Recent Labs  Lab 04/05/18 0357  WBC 19.2*  HGB 13.8  HCT 43.7  PLT 138*   ------------------------------------------------------------------------------------------------------------------  Chemistries  Recent Labs  Lab 04/05/18 0357  NA 137  K 4.1  CL 99  CO2 31  GLUCOSE 115*  BUN 22  CREATININE 1.58*  CALCIUM 8.6*   ------------------------------------------------------------------------------------------------------------------  Cardiac Enzymes No results for input(s): TROPONINI in the last 168 hours. ------------------------------------------------------------------------------------------------------------------  RADIOLOGY:  Dg Chest Port 1 View  Result Date: 04/05/2018 CLINICAL DATA:  Thoracotomy. EXAM: PORTABLE CHEST 1 VIEW COMPARISON:  04/04/2018. FINDINGS: Two right chest tubes in stable position. Near complete resolution of right apical pneumothorax. Stable cardiomegaly. Persistent but improved bibasilar atelectasis/infiltrates. No pneumothorax. Degenerative change thoracic spine. IMPRESSION: 1. Two right chest tubes in  stable position. Near complete resolution of right apical pneumothorax. 2.  Persistent but improved bibasilar atelectasis/infiltrates. Electronically Signed   By: Marcello Moores  Register   On: 04/05/2018 05:38   Dg Chest Port 1 View  Result Date: 04/04/2018 CLINICAL DATA:  Fever. EXAM: PORTABLE CHEST 1 VIEW COMPARISON:  Radiograph of April 04, 2018. FINDINGS: Stable cardiomegaly. Mild left basilar subsegmental atelectasis is noted. Two right-sided chest tubes are noted. Mild right apical pneumothorax is noted which appears to be enlarged compared to prior exam. Mild right  midlung subsegmental atelectasis is noted. Bony thorax is unremarkable. IMPRESSION: Two right-sided chest tubes are again noted, with mild right apical pneumothorax which is enlarged compared to prior exam. Mild left basilar subsegmental atelectasis is again noted. Electronically Signed   By: Marijo Conception, M.D.   On: 04/04/2018 15:24   Dg Chest Port 1 View  Result Date: 04/04/2018 CLINICAL DATA:  Follow-up right pneumothorax EXAM: PORTABLE CHEST 1 VIEW COMPARISON:  04/03/2018 FINDINGS: Cardiac shadow is enlarged but stable. Previously seen right-sided pneumothorax has resolved. Right chest tubes are noted and stable. The overall inspiratory effort is poor without focal infiltrate. No bony abnormality is seen. IMPRESSION: Resolution of previously seen right-sided pneumothorax. Electronically Signed   By: Inez Catalina M.D.   On: 04/04/2018 10:08    EKG:   Orders placed or performed during the hospital encounter of 03/26/18  . EKG test  . EKG test    IMPRESSION AND PLAN:   Torian Thoennes  is a 68 y.o. female with a known history of ongoing smoking, hypertension, hyperlipidemia who was diagnosed to have a right upper lobe nodule concerning for malignancy got admitted for thoracotomy.  1.  Sinus tachycardia-we will get an EKG. -Likely triggered by pain.  Also drinks heavy alcohol over the weekends.  No active withdrawals.  -Continue to monitor. -Low-dose metoprolol added.  2.  Acute renal insufficiency-continue with IV fluids. -Discontinue Motrin and change pain medications to opioids at this time -Continue to monitor.  Check urine output  3.  Hypertension-discontinue hydrochlorothiazide.  On metoprolol  4.  Tobacco use disorder-counseled  5.  Hyperlipidemia-statin  6.  DVT prophylaxis-add subcutaneous heparin    All the records are reviewed and case discussed with Consulting provider. Management plans discussed with the patient, family and they are in agreement.  CODE STATUS:  Full Code  TOTAL TIME TAKING CARE OF THIS PATIENT: 51 minutes.    Gladstone Lighter M.D on 04/05/2018 at 12:33 PM  Between 7am to 6pm - Pager - [phone]  After 6pm go to [email or web address] - password [email or web address] [facility]  Office  [phone]  CC: Primary care Physician: Olin Hauser, DO

## 2018-04-06 ENCOUNTER — Inpatient Hospital Stay: Payer: Medicare Other

## 2018-04-06 LAB — BASIC METABOLIC PANEL
Anion gap: 6 (ref 5–15)
BUN: 29 mg/dL — ABNORMAL HIGH (ref 8–23)
CALCIUM: 8.1 mg/dL — AB (ref 8.9–10.3)
CO2: 27 mmol/L (ref 22–32)
CREATININE: 1.06 mg/dL — AB (ref 0.44–1.00)
Chloride: 103 mmol/L (ref 98–111)
GFR calc Af Amer: >60 mL/min (ref >60)
GFR calc non Af Amer: 54 mL/min — ABNORMAL LOW (ref >60)
Glucose, Bld: 97 mg/dL (ref 70–99)
Potassium: 4.1 mmol/L (ref 3.5–5.1)
Sodium: 136 mmol/L (ref 135–145)

## 2018-04-06 LAB — CBC
HCT: 40.6 % (ref 36.0–46.0)
Hemoglobin: 12.9 g/dL (ref 12.0–15.0)
MCH: 29.5 pg (ref 26.0–34.0)
MCHC: 31.8 g/dL (ref 30.0–36.0)
MCV: 92.7 fL (ref 80.0–100.0)
NRBC: 0 % (ref 0.0–0.2)
PLATELETS: 141 10*3/uL — AB (ref 150–400)
RBC: 4.38 MIL/uL (ref 3.87–5.11)
RDW: 12.2 % (ref 11.5–15.5)
WBC: 11.7 10*3/uL — ABNORMAL HIGH (ref 4.0–10.5)

## 2018-04-06 MED ORDER — POLYETHYLENE GLYCOL 3350 17 G PO PACK
17.0000 g | PACK | Freq: Every day | ORAL | Status: DC
  Administered 2018-04-06 – 2018-04-08 (×3): 17 g via ORAL
  Filled 2018-04-06 (×4): qty 1

## 2018-04-06 NOTE — Progress Notes (Signed)
Patient ID: Joanna Morris, female   DOB: 1951-02-17, 68 y.o.   MRN: [id number]  [facility] PROGRESS NOTE  Sybella Harnish Gadea FOY:[id number] DOB: 01-18-1951 DOA: 04/02/2018 PCP: Olin Hauser, DO  HPI/Subjective: Patient was seen while nursing staff was changing the chest tube dressing secondary to drainage.  Patient feeling okay.  Some constipation.  Objective: Vitals:   04/06/18 0800 04/06/18 1216  BP:  (!) 93/52  Pulse:  67  Resp:  16  Temp:  98 F (36.7 C)  SpO2: 99% 97%    Filed Weights   04/02/18 1024 04/02/18 1837 04/02/18 1840  Weight: 87.5 kg 89.9 kg 89.9 kg    ROS: Review of Systems  Constitutional: Negative for chills and fever.  Eyes: Negative for blurred vision.  Respiratory: Negative for cough and shortness of breath.   Cardiovascular: Negative for chest pain.  Gastrointestinal: Negative for abdominal pain, constipation, diarrhea, nausea and vomiting.  Genitourinary: Negative for dysuria.  Musculoskeletal: Negative for joint pain.  Neurological: Negative for dizziness and headaches.   Exam: Physical Exam  Constitutional: She is oriented to person, place, and time.  HENT:  Nose: No mucosal edema.  Mouth/Throat: No oropharyngeal exudate or posterior oropharyngeal edema.  Eyes: Pupils are equal, round, and reactive to light. Conjunctivae, EOM and lids are normal.  Neck: No JVD present. Carotid bruit is not present. No edema present. No thyroid mass and no thyromegaly present.  Cardiovascular: S1 normal and S2 normal. Exam reveals no gallop.  No murmur heard. Pulses:      Dorsalis pedis pulses are 2+ on the right side and 2+ on the left side.  Respiratory: No respiratory distress. She has no wheezes. She has no rhonchi. She has no rales.  GI: Soft. Bowel sounds are normal. There is no abdominal tenderness.  Musculoskeletal:     Right ankle: She exhibits no swelling.     Left ankle: She exhibits no swelling.  Lymphadenopathy:    She has no  cervical adenopathy.  Neurological: She is alert and oriented to person, place, and time. No cranial nerve deficit.  Skin: Skin is warm. No rash noted. Nails show no clubbing.  Psychiatric: She has a normal mood and affect.      Data Reviewed: Basic Metabolic Panel: Recent Labs  Lab 04/04/18 1209 04/05/18 0357 04/05/18 1251 04/05/18 2238 04/06/18 0451  NA 138 137 137 137 136  K 4.4 4.1 4.0 4.1 4.1  CL 100 99 99 100 103  CO2 32 31 32 32 27  GLUCOSE 131* 115* 125* 130* 97  BUN 16 22 29* 33* 29*  CREATININE 1.05* 1.58* 1.94* 1.57* 1.06*  CALCIUM 8.9 8.6* 8.5* 8.2* 8.1*   Liver Function Tests: Recent Labs  Lab 04/05/18 2238  AST 60*  ALT 40  ALKPHOS 41  BILITOT 0.7  PROT 6.3*  ALBUMIN 2.9*   CBC: Recent Labs  Lab 04/02/18 1854 04/04/18 1209 04/05/18 0357 04/05/18 2238 04/06/18 0451  WBC 8.9 18.9* 19.2* 13.2* 11.7*  HGB 14.4 14.5 13.8 13.0 12.9  HCT 44.7 45.8 43.7 41.9 40.6  MCV 90.1 92.9 93.4 94.4 92.7  PLT 132* 136* 138* 141* 141*    CBG: Recent Labs  Lab 04/03/18 1551 04/03/18 2018 04/04/18 0009 04/04/18 0423 04/04/18 0748  GLUCAP 105* 125* 127* 150* 124*    Recent Results (from the past 240 hour(s))  MRSA PCR Screening     Status: None   Collection Time: 04/02/18  6:40 PM  Result Value Ref Range Status  MRSA by PCR NEGATIVE NEGATIVE Final    Comment:        The GeneXpert MRSA Assay (FDA approved for NASAL specimens only), is one component of a comprehensive MRSA colonization surveillance program. It is not intended to diagnose MRSA infection nor to guide or monitor treatment for MRSA infections. Performed at [facility], [address] [place], [place] [address]      Studies: Ct Head Wo Contrast  Result Date: 04/05/2018 CLINICAL DATA:  Confusion EXAM: CT HEAD WITHOUT CONTRAST TECHNIQUE: Contiguous axial images were obtained from the base of the skull through the vertex without intravenous contrast. COMPARISON:  None.  FINDINGS: Brain: No acute intracranial abnormality. Specifically, no hemorrhage, hydrocephalus, mass lesion, acute infarction, or significant intracranial injury. Vascular: No hyperdense vessel or unexpected calcification. Skull: No acute calvarial abnormality. Sinuses/Orbits: Visualized paranasal sinuses and mastoids clear. Orbital soft tissues unremarkable. Other: None IMPRESSION: No acute intracranial abnormality. Electronically Signed   By: Rolm Baptise M.D.   On: 04/05/2018 23:08   Dg Chest Port 1 View  Result Date: 04/06/2018 CLINICAL DATA:  Continued shortness of breath. Status post right thoracotomy on 04/02/2018. Follow-up small pneumothorax. EXAM: PORTABLE CHEST 1 VIEW COMPARISON:  04/05/2018 FINDINGS: The small right pneumothorax no previous day's study is no longer appreciated. Dual right-sided chest tubes are stable. Mild basilar atelectasis, left greater than right, is stable. Remainder of the lungs is clear. IMPRESSION: 1. Resolved right pneumothorax. 2. Mild persistent basilar atelectasis, greater on the left. No new lung abnormalities. Electronically Signed   By: Lajean Manes M.D.   On: 04/06/2018 11:31   Dg Chest Port 1 View  Result Date: 04/05/2018 CLINICAL DATA:  Status post right thoracotomy. EXAM: PORTABLE CHEST 1 VIEW COMPARISON:  04/05/2018 at 4:15 a.m. FINDINGS: Small right pneumothorax appears mildly increased in size from the earlier study. Right-sided chest tubes are stable. Atelectasis is noted in the lung bases, greater on the left. No evidence of pulmonary edema. IMPRESSION: 1. Small right pneumothorax appears increased in size from the earlier study. The difference may be due to differences in patient positioning and larger lung volumes on the current exam as opposed to a true increase in size. 2. Mild basilar atelectasis, greater on the left, similar to the prior exam. Electronically Signed   By: Lajean Manes M.D.   On: 04/05/2018 15:06   Dg Chest Port 1 View  Result  Date: 04/05/2018 CLINICAL DATA:  Thoracotomy. EXAM: PORTABLE CHEST 1 VIEW COMPARISON:  04/04/2018. FINDINGS: Two right chest tubes in stable position. Near complete resolution of right apical pneumothorax. Stable cardiomegaly. Persistent but improved bibasilar atelectasis/infiltrates. No pneumothorax. Degenerative change thoracic spine. IMPRESSION: 1. Two right chest tubes in stable position. Near complete resolution of right apical pneumothorax. 2.  Persistent but improved bibasilar atelectasis/infiltrates. Electronically Signed   By: Marcello Moores  Register   On: 04/05/2018 05:38   Dg Chest Port 1 View  Result Date: 04/04/2018 CLINICAL DATA:  Fever. EXAM: PORTABLE CHEST 1 VIEW COMPARISON:  Radiograph of April 04, 2018. FINDINGS: Stable cardiomegaly. Mild left basilar subsegmental atelectasis is noted. Two right-sided chest tubes are noted. Mild right apical pneumothorax is noted which appears to be enlarged compared to prior exam. Mild right midlung subsegmental atelectasis is noted. Bony thorax is unremarkable. IMPRESSION: Two right-sided chest tubes are again noted, with mild right apical pneumothorax which is enlarged compared to prior exam. Mild left basilar subsegmental atelectasis is again noted. Electronically Signed   By: Marijo Conception, M.D.  On: 04/04/2018 15:24    Scheduled Meds: . albuterol  2.5 mg Nebulization BID  . bisacodyl  10 mg Oral Daily  . heparin injection (subcutaneous)  5,000 Units Subcutaneous Q8H  . methocarbamol  500 mg Oral QID  . metoprolol tartrate  25 mg Oral BID  . rosuvastatin  5 mg Oral QHS   Continuous Infusions:  Assessment/Plan:  1. Acute kidney injury.  Has improved. 2. Acute encephalopathy.  This has improved.  Nurse trying to give Tylenol for pain at this point. 3. Adenocarcinoma of the lung.  Lymph nodes were negative for malignancy.  Status post surgery and chest tube.  Chest tube management as per surgery. 4. Hyperlipidemia unspecified on  Crestor 5. Hypertension on metoprolol 6. Tobacco use.  PRN nebulizers  Code Status:     Code Status Orders  (From admission, onward)         Start     Ordered   04/02/18 1847  Full code  Continuous     04/02/18 1846        Code Status History    Date Active Date Inactive Code Status Order ID Comments User Context   05/22/2016 1823 05/22/2016 2206 Full Code [id number]  Dereck Leep, MD Inpatient      Disposition Plan: Chest tube will need to be removed prior to disposition  Consultants:  Cardiothoracic surgery  Time spent: 27 minutes  [place]

## 2018-04-06 NOTE — Progress Notes (Signed)
[place] Hospital Day(s): 4.   Post op day(s): 4 Days Post-Op.   Interval History: Patient seen and examined, reportedly experienced altered mental status overnight, for which CT head was performed, but she currently communicates who and where she is and describes why she is here. She otherwise denies SOB, Right-sided CP, or CP otherwise, has been tolerating her diet without difficulty. Dressing changed once per RN.  Review of Systems:  Constitutional: denies fever, chills  Respiratory: denies any shortness of breath Cardiovascular: denies chest pain or palpitations  Musculoskeletal: denies pain, decreased motor or sensation Integumentary: denies any other rashes or skin discolorations except as per interval history  Vital signs in last 24 hours: [min-max] current  Temp:  [98.6 F (37 C)-99.9 F (37.7 C)] 98.6 F (37 C) (02/01 0426) Pulse Rate:  [80-115] 86 (02/01 0426) Resp:  [19] 19 (02/01 0426) BP: (96-123)/(63-98) 110/63 (02/01 0426) SpO2:  [93 %-100 %] 99 % (02/01 0721)     Height: 5\' 1"  (154.9 cm) Weight: 89.9 kg BMI (Calculated): 37.47   Intake/Output this shift:  No intake/output data recorded.   Intake/Output last 2 shifts:  @IOLAST2SHIFTS @   Physical Exam:  Constitutional: alert, cooperative and no distress  Respiratory: breathing non-labored at rest on 2L oxygen via [place], Right CT secured with dressing c/d/i, Right CT with air leak clearly much more readily noticeable than previously described Cardiovascular: regular rate and sinus rhythm   Labs:  CBC Latest Ref Rng & Units 04/06/2018 04/05/2018 04/05/2018  WBC 4.0 - 10.5 K/uL 11.7(H) 13.2(H) 19.2(H)  Hemoglobin 12.0 - 15.0 g/dL 12.9 13.0 13.8  Hematocrit 36.0 - 46.0 % 40.6 41.9 43.7  Platelets 150 - 400 K/uL 141(L) 141(L) 138(L)   CMP Latest Ref Rng & Units 04/06/2018 04/05/2018 04/05/2018  Glucose 70 - 99 mg/dL 97 130(H) 125(H)  BUN 8 - 23 mg/dL 29(H) 33(H) 29(H)  Creatinine 0.44 - 1.00 mg/dL  1.06(H) 1.57(H) 1.94(H)  Sodium 135 - 145 mmol/L 136 137 137  Potassium 3.5 - 5.1 mmol/L 4.1 4.1 4.0  Chloride 98 - 111 mmol/L 103 100 99  CO2 22 - 32 mmol/L 27 32 32  Calcium 8.9 - 10.3 mg/dL 8.1(L) 8.2(L) 8.5(L)  Total Protein 6.5 - 8.1 g/dL - 6.3(L) -  Total Bilirubin 0.3 - 1.2 mg/dL - 0.7 -  Alkaline Phos 38 - 126 U/L - 41 -  AST 15 - 41 U/L - 60(H) -  ALT 0 - 44 U/L - 40 -   Imaging studies:  Chest X-ray (04/06/2018) - personally reviewed The small right pneumothorax no previous day's study is no longer Appreciated. Dual right-sided chest tubes are stable. Mild basilar atelectasis, left greater than right, is stable. Remainder of the lungs is clear.  Assessment/Plan: 68 y.o. female with +air leak and resolved Right apical pneumothorax 4 Days Post-Op s/p Right upper lobectomy with mediastinal lymphadenectomy for Right upper lobe masses x2, complicated by pertinent comorbidities including obesity (BMI 38), HTN, HLD, GERD, history of nephrolithiasis, and chronic tobacco abuse (smoking).   - pain control as needed (minimize narcotics)  - chest tube suction decreased from 40 mmH20 to 20 mmH20  - may consider changing chest tube to water seal tomorrow pending chest x-ray  - heparin and medical management of comorbidities as per medical team  - ambulation and incentive spirometry encouraged  All of the above findings and recommendations were discussed with the patient and the medical team, and all of patient's questions were answered to her expressed  satisfaction.  -- Marilynne Drivers Rosana Hoes, MD, [place]: [facility] - Partnering for exceptional care. Office: [phone]

## 2018-04-07 ENCOUNTER — Inpatient Hospital Stay: Payer: Medicare Other

## 2018-04-07 NOTE — Progress Notes (Signed)
Patient ID: Joanna Morris, female   DOB: [birth year], 68 y.o.   MRN: [id number]  [facility] PROGRESS NOTE  Joanna Morris CMK:[id number] DOB: Dec 09, 1950 DOA: 04/02/2018 PCP: Olin Hauser, DO  HPI/Subjective: Patient does have a little pain.  No complaints of shortness of breath.  Some constipation.  Objective: Vitals:   04/07/18 0944 04/07/18 1224  BP: 118/60 110/69  Pulse: 66 65  Resp:  20  Temp:  98.8 F (37.1 C)  SpO2:  97%    Filed Weights   04/02/18 1024 04/02/18 1837 04/02/18 1840  Weight: 87.5 kg 89.9 kg 89.9 kg    ROS: Review of Systems  Constitutional: Negative for chills and fever.  Eyes: Negative for blurred vision.  Respiratory: Negative for cough and shortness of breath.   Cardiovascular: Negative for chest pain.  Gastrointestinal: Positive for constipation. Negative for abdominal pain, diarrhea, nausea and vomiting.  Genitourinary: Negative for dysuria.  Musculoskeletal: Negative for joint pain.  Neurological: Negative for dizziness and headaches.   Exam: Physical Exam  Constitutional: She is oriented to person, place, and time.  HENT:  Nose: No mucosal edema.  Mouth/Throat: No oropharyngeal exudate or posterior oropharyngeal edema.  Eyes: Pupils are equal, round, and reactive to light. Conjunctivae, EOM and lids are normal.  Neck: No JVD present. Carotid bruit is not present. No edema present. No thyroid mass and no thyromegaly present.  Cardiovascular: S1 normal and S2 normal. Exam reveals no gallop.  No murmur heard. Pulses:      Dorsalis pedis pulses are 2+ on the right side and 2+ on the left side.  Respiratory: No respiratory distress. She has decreased breath sounds in the right lower field and the left lower field. She has no wheezes. She has no rhonchi. She has no rales.  GI: Soft. Bowel sounds are normal. There is no abdominal tenderness.  Musculoskeletal:     Right ankle: She exhibits no swelling.     Left ankle: She  exhibits no swelling.  Lymphadenopathy:    She has no cervical adenopathy.  Neurological: She is alert and oriented to person, place, and time. No cranial nerve deficit.  Skin: Skin is warm. No rash noted. Nails show no clubbing.  Psychiatric: She has a normal mood and affect.      Data Reviewed: Basic Metabolic Panel: Recent Labs  Lab 04/04/18 1209 04/05/18 0357 04/05/18 1251 04/05/18 2238 04/06/18 0451  NA 138 137 137 137 136  K 4.4 4.1 4.0 4.1 4.1  CL 100 99 99 100 103  CO2 32 31 32 32 27  GLUCOSE 131* 115* 125* 130* 97  BUN 16 22 29* 33* 29*  CREATININE 1.05* 1.58* 1.94* 1.57* 1.06*  CALCIUM 8.9 8.6* 8.5* 8.2* 8.1*   Liver Function Tests: Recent Labs  Lab 04/05/18 2238  AST 60*  ALT 40  ALKPHOS 41  BILITOT 0.7  PROT 6.3*  ALBUMIN 2.9*   CBC: Recent Labs  Lab 04/02/18 1854 04/04/18 1209 04/05/18 0357 04/05/18 2238 04/06/18 0451  WBC 8.9 18.9* 19.2* 13.2* 11.7*  HGB 14.4 14.5 13.8 13.0 12.9  HCT 44.7 45.8 43.7 41.9 40.6  MCV 90.1 92.9 93.4 94.4 92.7  PLT 132* 136* 138* 141* 141*    CBG: Recent Labs  Lab 04/03/18 1551 04/03/18 2018 04/04/18 0009 04/04/18 0423 04/04/18 0748  GLUCAP 105* 125* 127* 150* 124*    Recent Results (from the past 240 hour(s))  MRSA PCR Screening     Status: None   Collection Time:  04/02/18  6:40 PM  Result Value Ref Range Status   MRSA by PCR NEGATIVE NEGATIVE Final    Comment:        The GeneXpert MRSA Assay (FDA approved for NASAL specimens only), is one component of a comprehensive MRSA colonization surveillance program. It is not intended to diagnose MRSA infection nor to guide or monitor treatment for MRSA infections. Performed at [facility], [address] [place], [place] [address]      Studies: Ct Head Wo Contrast  Result Date: 04/05/2018 CLINICAL DATA:  Confusion EXAM: CT HEAD WITHOUT CONTRAST TECHNIQUE: Contiguous axial images were obtained from the base of the skull through the  vertex without intravenous contrast. COMPARISON:  None. FINDINGS: Brain: No acute intracranial abnormality. Specifically, no hemorrhage, hydrocephalus, mass lesion, acute infarction, or significant intracranial injury. Vascular: No hyperdense vessel or unexpected calcification. Skull: No acute calvarial abnormality. Sinuses/Orbits: Visualized paranasal sinuses and mastoids clear. Orbital soft tissues unremarkable. Other: None IMPRESSION: No acute intracranial abnormality. Electronically Signed   By: Rolm Baptise M.D.   On: 04/05/2018 23:08   Dg Chest Port 1 View  Result Date: 04/07/2018 CLINICAL DATA:  Chest tube EXAM: PORTABLE CHEST 1 VIEW COMPARISON:  04/06/2018 FINDINGS: Small right pneumothorax, minimally increased. Two indwelling right chest tubes. Skin staples along the right lateral chest wall. Left lung is clear. Cardiomegaly. IMPRESSION: Postsurgical changes involving the right hemithorax. Associated small right pneumothorax, minimally increased. Two indwelling right chest tubes. Electronically Signed   By: Julian Hy M.D.   On: 04/07/2018 06:37   Dg Chest Port 1 View  Result Date: 04/06/2018 CLINICAL DATA:  Continued shortness of breath. Status post right thoracotomy on 04/02/2018. Follow-up small pneumothorax. EXAM: PORTABLE CHEST 1 VIEW COMPARISON:  04/05/2018 FINDINGS: The small right pneumothorax no previous day's study is no longer appreciated. Dual right-sided chest tubes are stable. Mild basilar atelectasis, left greater than right, is stable. Remainder of the lungs is clear. IMPRESSION: 1. Resolved right pneumothorax. 2. Mild persistent basilar atelectasis, greater on the left. No new lung abnormalities. Electronically Signed   By: Lajean Manes M.D.   On: 04/06/2018 11:31   Dg Chest Port 1 View  Result Date: 04/05/2018 CLINICAL DATA:  Status post right thoracotomy. EXAM: PORTABLE CHEST 1 VIEW COMPARISON:  04/05/2018 at 4:15 a.m. FINDINGS: Small right pneumothorax appears mildly  increased in size from the earlier study. Right-sided chest tubes are stable. Atelectasis is noted in the lung bases, greater on the left. No evidence of pulmonary edema. IMPRESSION: 1. Small right pneumothorax appears increased in size from the earlier study. The difference may be due to differences in patient positioning and larger lung volumes on the current exam as opposed to a true increase in size. 2. Mild basilar atelectasis, greater on the left, similar to the prior exam. Electronically Signed   By: Lajean Manes M.D.   On: 04/05/2018 15:06    Scheduled Meds: . albuterol  2.5 mg Nebulization BID  . bisacodyl  10 mg Oral Daily  . heparin injection (subcutaneous)  5,000 Units Subcutaneous Q8H  . methocarbamol  500 mg Oral QID  . metoprolol tartrate  25 mg Oral BID  . polyethylene glycol  17 g Oral Daily  . rosuvastatin  5 mg Oral QHS   Continuous Infusions:  Assessment/Plan:  1. Acute kidney injury.  Has resolved 2. Acute encephalopathy.  This has improved.  Nurse trying to give Tylenol for pain at this point.  Limiting narcotics at this point. 3. Adenocarcinoma  of the lung.  Lymph nodes were negative for malignancy.  Patient has seen Dr. Janese Banks as outpatient.  Status post surgery and chest tube.  Chest tube management as per surgery. 4. Hyperlipidemia unspecified on Crestor 5. Hypertension on metoprolol 6. Tobacco use.  PRN nebulizers  Code Status:     Code Status Orders  (From admission, onward)         Start     Ordered   04/02/18 1847  Full code  Continuous     04/02/18 1846        Code Status History    Date Active Date Inactive Code Status Order ID Comments User Context   05/22/2016 1823 05/22/2016 2206 Full Code [id number]  Dereck Leep, MD Inpatient      Disposition Plan: Chest tube will need to be removed prior to disposition  Consultants:  Cardiothoracic surgery  Time spent: 26 minutes  [place]

## 2018-04-07 NOTE — Progress Notes (Signed)
[place] Hospital Day(s): 5.   Post op day(s): 5 Days Post-Op.   Interval History: Patient seen and examined, no acute events or new complaints overnight. Patient reports she's feeling okay, denies CP, SOB, fever/chills, or N/V. Her chest tube dressing has reportedly been changed once again per patient.  Review of Systems:  Constitutional: denies fever, chills  Respiratory: denies any shortness of breath Cardiovascular: denies chest pain or palpitations  Musculoskeletal: denies pain, decreased motor or sensation Integumentary: denies any other rashes or skin discolorations except as per interval history  Vital signs in last 24 hours: [min-max] current  Temp:  [98 F (36.7 C)-98.2 F (36.8 C)] 98.2 F (36.8 C) (02/02 0430) Pulse Rate:  [63-70] 66 (02/02 0944) Resp:  [16-18] 18 (02/01 1941) BP: (90-118)/(52-62) 118/60 (02/02 0944) SpO2:  [96 %-100 %] 100 % (02/02 0430)     Height: 5\' 1"  (154.9 cm) Weight: 89.9 kg BMI (Calculated): 37.47   Intake/Output this shift:  No intake/output data recorded.   Intake/Output last 2 shifts:  @IOLAST2SHIFTS @   Physical Exam:  Constitutional: alert, cooperative and no distress  Respiratory: breathing non-labored at rest on 2L oxygen via [place], Right CT secured with dressing intact, Right much decreased air leak only now visualized with coughing Cardiovascular: regular rate and sinus rhythm   Labs:  CBC Latest Ref Rng & Units 04/06/2018 04/05/2018 04/05/2018  WBC 4.0 - 10.5 K/uL 11.7(H) 13.2(H) 19.2(H)  Hemoglobin 12.0 - 15.0 g/dL 12.9 13.0 13.8  Hematocrit 36.0 - 46.0 % 40.6 41.9 43.7  Platelets 150 - 400 K/uL 141(L) 141(L) 138(L)   CMP Latest Ref Rng & Units 04/06/2018 04/05/2018 04/05/2018  Glucose 70 - 99 mg/dL 97 130(H) 125(H)  BUN 8 - 23 mg/dL 29(H) 33(H) 29(H)  Creatinine 0.44 - 1.00 mg/dL 1.06(H) 1.57(H) 1.94(H)  Sodium 135 - 145 mmol/L 136 137 137  Potassium 3.5 - 5.1 mmol/L 4.1 4.1 4.0  Chloride 98 - 111 mmol/L 103 100  99  CO2 22 - 32 mmol/L 27 32 32  Calcium 8.9 - 10.3 mg/dL 8.1(L) 8.2(L) 8.5(L)  Total Protein 6.5 - 8.1 g/dL - 6.3(L) -  Total Bilirubin 0.3 - 1.2 mg/dL - 0.7 -  Alkaline Phos 38 - 126 U/L - 41 -  AST 15 - 41 U/L - 60(H) -  ALT 0 - 44 U/L - 40 -   Imaging studies:  Chest X-ray (04/07/2018) - personally reviewed and discussed with patient Small right pneumothorax, minimally increased. Two indwelling right chest tubes. Skin staples along the right lateral chest wall. Left lung is clear. Cardiomegaly.  Assessment/Plan: 68 y.o. female with decreased air leak and minimally increased Right apical pneumothorax with suction decreased from 40 mmH2O to 20 mmH2O over the past 24 hours, 5 Days Post-Op s/p Right upper lobectomy with mediastinal lymphadenectomy for Right upper lobe masses x2, complicated by pertinent comorbidities including obesity (BMI 38), HTN, HLD, GERD, history of nephrolithiasis, and chronic tobacco abuse (smoking).              - pain control as needed (minimize narcotics)             - chest tube suction decreased further from 20 mmH2O to 10 mmH2O to further reduce air leak, while minimizing further increased Right apical pneumothorax  - if develops acute SOB, examine patient for decreased Right lung breath sounds, obtain stat portable chest x-ray (if able), and increase suction prn             -  may consider changing chest tube to water seal tomorrow pending chest x-ray             - heparin and medical management of comorbidities as per medical team             - ambulation and incentive spirometry encouraged  - will discuss the above with Dr. Genevive Bi  All of the above findings and recommendations were discussed with the patient and the medical team, and all of patient's questions were answered to her expressed satisfaction.  -- Marilynne Drivers Rosana Hoes, MD, [place]: [facility] - Partnering for exceptional care. Office: [phone]

## 2018-04-08 ENCOUNTER — Inpatient Hospital Stay: Payer: Medicare Other

## 2018-04-08 LAB — BASIC METABOLIC PANEL
Anion gap: 5 (ref 5–15)
BUN: 14 mg/dL (ref 8–23)
CHLORIDE: 104 mmol/L (ref 98–111)
CO2: 31 mmol/L (ref 22–32)
CREATININE: 0.66 mg/dL (ref 0.44–1.00)
Calcium: 8.4 mg/dL — ABNORMAL LOW (ref 8.9–10.3)
GFR calc Af Amer: >60 mL/min (ref >60)
GFR calc non Af Amer: >60 mL/min (ref >60)
Glucose, Bld: 93 mg/dL (ref 70–99)
Potassium: 3.5 mmol/L (ref 3.5–5.1)
SODIUM: 140 mmol/L (ref 135–145)

## 2018-04-08 MED ORDER — SORBITOL 70 % SOLN
960.0000 mL | TOPICAL_OIL | Freq: Once | ORAL | Status: DC
  Filled 2018-04-08: qty 473

## 2018-04-08 NOTE — Progress Notes (Signed)
Per zack PA okay for RN to DC foley catheter.

## 2018-04-08 NOTE — Care Management (Signed)
Barrier- continues with 2 chest tubes. Pneumothorax improved.

## 2018-04-08 NOTE — Progress Notes (Signed)
Patient ID: Joanna Morris, female   DOB: 1950-10-03, 68 y.o.   MRN: [id number]  She continues to improve overall.  She had approximately 150 cc out from her chest tubes over the last shift.  This is serous in nature.  Her wounds are all healing as expected.  I did change all of her dressings.  Her lungs are distant but equal.  Her heart is regular.  She has a small air leak on 10 cm of water suction.  Her chest x-ray does not show any evidence of pneumothorax  She has asked for a suppository for her bowels.  We will leave her chest tubes in today will place her chest tubes to waterseal and repeat her chest x-ray tomorrow.  I would also like to check her laboratory studies today.  Her urine output has been quite good over the last 24 hours and we will remove her Foley catheter.

## 2018-04-08 NOTE — Progress Notes (Signed)
Patient ID: Joanna Morris, female   DOB: 12-05-1950, 68 y.o.   MRN: [id number]  [facility] PROGRESS NOTE  Joanna Morris MEQ:[id number] DOB: [birth year] DOA: 04/02/2018 PCP: Olin Hauser, DO  HPI/Subjective: Patient feels okay.  Some slight pain.  States she was going to walk around today.  Had a bowel movement during the hospital course.  Objective: Vitals:   04/08/18 1341 04/08/18 1437  BP: (!) 150/80 (!) 141/80  Pulse: 64   Resp: 17   Temp: 98.6 F (37 C)   SpO2: 98%     Filed Weights   04/02/18 1024 04/02/18 1837 04/02/18 1840  Weight: 87.5 kg 89.9 kg 89.9 kg    ROS: Review of Systems  Constitutional: Negative for chills and fever.  Eyes: Negative for blurred vision.  Respiratory: Negative for cough and shortness of breath.   Cardiovascular: Negative for chest pain.  Gastrointestinal: Negative for abdominal pain, constipation, diarrhea, nausea and vomiting.  Genitourinary: Negative for dysuria.  Musculoskeletal: Negative for joint pain.  Neurological: Negative for dizziness and headaches.   Exam: Physical Exam  Constitutional: She is oriented to person, place, and time.  HENT:  Nose: No mucosal edema.  Mouth/Throat: No oropharyngeal exudate or posterior oropharyngeal edema.  Eyes: Pupils are equal, round, and reactive to light. Conjunctivae, EOM and lids are normal.  Neck: No JVD present. Carotid bruit is not present. No edema present. No thyroid mass and no thyromegaly present.  Cardiovascular: S1 normal and S2 normal. Exam reveals no gallop.  No murmur heard. Pulses:      Dorsalis pedis pulses are 2+ on the right side and 2+ on the left side.  Respiratory: No respiratory distress. She has decreased breath sounds in the right lower field and the left lower field. She has no wheezes. She has no rhonchi. She has no rales.  GI: Soft. Bowel sounds are normal. There is no abdominal tenderness.  Musculoskeletal:     Right ankle: She exhibits no  swelling.     Left ankle: She exhibits no swelling.  Lymphadenopathy:    She has no cervical adenopathy.  Neurological: She is alert and oriented to person, place, and time. No cranial nerve deficit.  Skin: Skin is warm. No rash noted. Nails show no clubbing.  Psychiatric: She has a normal mood and affect.      Data Reviewed: Basic Metabolic Panel: Recent Labs  Lab 04/05/18 0357 04/05/18 1251 04/05/18 2238 04/06/18 0451 04/08/18 0757  NA 137 137 137 136 140  K 4.1 4.0 4.1 4.1 3.5  CL 99 99 100 103 104  CO2 31 32 32 27 31  GLUCOSE 115* 125* 130* 97 93  BUN 22 29* 33* 29* 14  CREATININE 1.58* 1.94* 1.57* 1.06* 0.66  CALCIUM 8.6* 8.5* 8.2* 8.1* 8.4*   Liver Function Tests: Recent Labs  Lab 04/05/18 2238  AST 60*  ALT 40  ALKPHOS 41  BILITOT 0.7  PROT 6.3*  ALBUMIN 2.9*   CBC: Recent Labs  Lab 04/02/18 1854 04/04/18 1209 04/05/18 0357 04/05/18 2238 04/06/18 0451  WBC 8.9 18.9* 19.2* 13.2* 11.7*  HGB 14.4 14.5 13.8 13.0 12.9  HCT 44.7 45.8 43.7 41.9 40.6  MCV 90.1 92.9 93.4 94.4 92.7  PLT 132* 136* 138* 141* 141*    CBG: Recent Labs  Lab 04/03/18 1551 04/03/18 2018 04/04/18 0009 04/04/18 0423 04/04/18 0748  GLUCAP 105* 125* 127* 150* 124*    Recent Results (from the past 240 hour(s))  MRSA PCR Screening  Status: None   Collection Time: 04/02/18  6:40 PM  Result Value Ref Range Status   MRSA by PCR NEGATIVE NEGATIVE Final    Comment:        The GeneXpert MRSA Assay (FDA approved for NASAL specimens only), is one component of a comprehensive MRSA colonization surveillance program. It is not intended to diagnose MRSA infection nor to guide or monitor treatment for MRSA infections. Performed at [facility], [address] [place], [place] [address]      Studies: Dg Chest [place] 1 View  Result Date: 04/08/2018 CLINICAL DATA:  RT SIDE CHEST TUBE EXAM: PORTABLE CHEST - 1 VIEW COMPARISON:  the previous day's study FINDINGS: Stable  right chest tubes. Small apical pneumothorax seen previously is no longer evident. Coarse perihilar and bibasilar interstitial markings as before. Heart size upper limits normal. Aortic Atherosclerosis (ICD10-170.0). Stable right paratracheal opacity. No effusion. Visualized bones unremarkable. IMPRESSION: Stable right chest tubes with no pneumothorax evident. Electronically Signed   By: Lucrezia Europe M.D.   On: 04/08/2018 07:48   Dg Chest Port 1 View  Result Date: 04/07/2018 CLINICAL DATA:  Chest tube EXAM: PORTABLE CHEST 1 VIEW COMPARISON:  04/06/2018 FINDINGS: Small right pneumothorax, minimally increased. Two indwelling right chest tubes. Skin staples along the right lateral chest wall. Left lung is clear. Cardiomegaly. IMPRESSION: Postsurgical changes involving the right hemithorax. Associated small right pneumothorax, minimally increased. Two indwelling right chest tubes. Electronically Signed   By: Julian Hy M.D.   On: 04/07/2018 06:37    Scheduled Meds: . albuterol  2.5 mg Nebulization BID  . bisacodyl  10 mg Oral Daily  . heparin injection (subcutaneous)  5,000 Units Subcutaneous Q8H  . methocarbamol  500 mg Oral QID  . metoprolol tartrate  25 mg Oral BID  . polyethylene glycol  17 g Oral Daily  . rosuvastatin  5 mg Oral QHS  . sorbitol, milk of mag, mineral oil, glycerin (SMOG) enema  960 mL Rectal Once   Continuous Infusions:  Assessment/Plan:  1. Acute kidney injury.  Has resolved 2. Acute metabolic encephalopathy.  This has improved.  Nurse trying to give Tylenol for pain at this point.  Limited narcotics at this point. 3. Adenocarcinoma of the lung.  Lymph nodes were negative for malignancy.  Patient has seen Dr. Janese Banks as outpatient.  Status post surgery and chest tube.  Chest tube management as per surgery.  4. Hyperlipidemia unspecified on Crestor 5. Hypertension on metoprolol 6. Tobacco use.  PRN nebulizers  Code Status:     Code Status Orders  (From admission,  onward)         Start     Ordered   04/02/18 1847  Full code  Continuous     04/02/18 1846        Code Status History    Date Active Date Inactive Code Status Order ID Comments User Context   05/22/2016 1823 05/22/2016 2206 Full Code [id number]  Dereck Leep, MD Inpatient      Disposition Plan: Chest tube will need to be removed prior to disposition  Consultants:  Cardiothoracic surgery  Time spent: 25 minutes  [place]

## 2018-04-08 NOTE — Care Management Important Message (Signed)
Copy of signed Medicare IM left with patient in room. 

## 2018-04-09 ENCOUNTER — Inpatient Hospital Stay: Payer: Medicare Other

## 2018-04-09 NOTE — Progress Notes (Signed)
PT Cancellation Note  Patient Details Name: Joanna Morris MRN: [id number] DOB: 11-29-1950   Cancelled Treatment:    Reason Eval/Treat Not Completed: Pain limiting ability to participate   Pt in bed,  Refused session due to pain in back.  Stated she had received pain medication recently from nursing.  Encouraged mobility for pain relief but she continued to decline.  Will attempt again this pm as schedule allows.   Chesley Noon 04/09/2018, 11:02 AM

## 2018-04-09 NOTE — Progress Notes (Signed)
Patient ID: Joanna Morris, female   DOB: January 05, 1951, 68 y.o.   MRN: [id number]  [facility] PROGRESS NOTE  Joanna Morris IEP:[id number] DOB: 1950/12/29 DOA: 04/02/2018 PCP: Joanna Hauser, DO  HPI/Subjective: Patient feeling okay.  Slight pain at surgical site.  Breathing fine.  Objective: Vitals:   04/09/18 0904 04/09/18 1126  BP:  140/73  Pulse:  62  Resp:    Temp:  98 F (36.7 C)  SpO2: 95% 94%    Filed Weights   04/02/18 1024 04/02/18 1837 04/02/18 1840  Weight: 87.5 kg 89.9 kg 89.9 kg    ROS: Review of Systems  Constitutional: Negative for chills and fever.  Eyes: Negative for blurred vision.  Respiratory: Negative for cough and shortness of breath.   Cardiovascular: Negative for chest pain.  Gastrointestinal: Negative for abdominal pain, constipation, diarrhea, nausea and vomiting.  Genitourinary: Negative for dysuria.  Musculoskeletal: Negative for joint pain.  Neurological: Negative for dizziness and headaches.   Exam: Physical Exam  Constitutional: She is oriented to person, place, and time.  HENT:  Nose: No mucosal edema.  Mouth/Throat: No oropharyngeal exudate or posterior oropharyngeal edema.  Eyes: Pupils are equal, round, and reactive to light. Conjunctivae, EOM and lids are normal.  Neck: No JVD present. Carotid bruit is not present. No edema present. No thyroid mass and no thyromegaly present.  Cardiovascular: S1 normal and S2 normal. Exam reveals no gallop.  No murmur heard. Pulses:      Dorsalis pedis pulses are 2+ on the right side and 2+ on the left side.  Respiratory: No respiratory distress. She has decreased breath sounds in the right lower field and the left lower field. She has no wheezes. She has no rhonchi. She has no rales.  GI: Soft. Bowel sounds are normal. There is no abdominal tenderness.  Musculoskeletal:     Right ankle: She exhibits no swelling.     Left ankle: She exhibits no swelling.  Lymphadenopathy:    She  has no cervical adenopathy.  Neurological: She is alert and oriented to person, place, and time. No cranial nerve deficit.  Skin: Skin is warm. No rash noted. Nails show no clubbing.  Psychiatric: She has a normal mood and affect.      Data Reviewed: Basic Metabolic Panel: Recent Labs  Lab 04/05/18 0357 04/05/18 1251 04/05/18 2238 04/06/18 0451 04/08/18 0757  NA 137 137 137 136 140  K 4.1 4.0 4.1 4.1 3.5  CL 99 99 100 103 104  CO2 31 32 32 27 31  GLUCOSE 115* 125* 130* 97 93  BUN 22 29* 33* 29* 14  CREATININE 1.58* 1.94* 1.57* 1.06* 0.66  CALCIUM 8.6* 8.5* 8.2* 8.1* 8.4*   Liver Function Tests: Recent Labs  Lab 04/05/18 2238  AST 60*  ALT 40  ALKPHOS 41  BILITOT 0.7  PROT 6.3*  ALBUMIN 2.9*   CBC: Recent Labs  Lab 04/02/18 1854 04/04/18 1209 04/05/18 0357 04/05/18 2238 04/06/18 0451  WBC 8.9 18.9* 19.2* 13.2* 11.7*  HGB 14.4 14.5 13.8 13.0 12.9  HCT 44.7 45.8 43.7 41.9 40.6  MCV 90.1 92.9 93.4 94.4 92.7  PLT 132* 136* 138* 141* 141*    CBG: Recent Labs  Lab 04/03/18 1551 04/03/18 2018 04/04/18 0009 04/04/18 0423 04/04/18 0748  GLUCAP 105* 125* 127* 150* 124*    Recent Results (from the past 240 hour(s))  MRSA PCR Screening     Status: None   Collection Time: 04/02/18  6:40 PM  Result  Value Ref Range Status   MRSA by PCR NEGATIVE NEGATIVE Final    Comment:        The GeneXpert MRSA Assay (FDA approved for NASAL specimens only), is one component of a comprehensive MRSA colonization surveillance program. It is not intended to diagnose MRSA infection nor to guide or monitor treatment for MRSA infections. Performed at [facility] [address] [place], [place] [address]      Studies: Dg Chest [facility] 1 View  Result Date: 04/09/2018 CLINICAL DATA:  Follow-up thoracotomy EXAM: PORTABLE CHEST 1 VIEW COMPARISON:  Yesterday FINDINGS: Right-sided chest tubes in stable position. Small right apical pneumothorax measuring 2 rib  interspaces, likely stable. Postoperative volume loss and atelectasis on the right. Cardiomegaly accentuated by low volumes. IMPRESSION: Postoperative chest with small right apical pneumothorax. Electronically Signed   By: Monte Fantasia M.D.   On: 04/09/2018 05:56   Dg Chest Port 1 View  Result Date: 04/08/2018 CLINICAL DATA:  RT SIDE CHEST TUBE EXAM: PORTABLE CHEST - 1 VIEW COMPARISON:  the previous day's study FINDINGS: Stable right chest tubes. Small apical pneumothorax seen previously is no longer evident. Coarse perihilar and bibasilar interstitial markings as before. Heart size upper limits normal. Aortic Atherosclerosis (ICD10-170.0). Stable right paratracheal opacity. No effusion. Visualized bones unremarkable. IMPRESSION: Stable right chest tubes with no pneumothorax evident. Electronically Signed   By: Lucrezia Europe M.D.   On: 04/08/2018 07:48    Scheduled Meds: . albuterol  2.5 mg Nebulization BID  . bisacodyl  10 mg Oral Daily  . heparin injection (subcutaneous)  5,000 Units Subcutaneous Q8H  . methocarbamol  500 mg Oral QID  . metoprolol tartrate  25 mg Oral BID  . polyethylene glycol  17 g Oral Daily  . rosuvastatin  5 mg Oral QHS  . sorbitol, milk of mag, mineral oil, glycerin (SMOG) enema  960 mL Rectal Once    Assessment/Plan:  1. Acute kidney injury.  This has resolved. 2. Acute metabolic encephalopathy.  This has improved.   3. Adenocarcinoma of the lung.  Lymph nodes were negative for malignancy.  Patient has seen Dr. Janese Banks as outpatient.  Status post surgery and chest tube.  1 chest tube removed today.  The other chest tube potentially be able to be removed tomorrow.  Once this other chest tube is removed can potentially go home. 4. Hyperlipidemia unspecified on Crestor 5. Hypertension on metoprolol.  Blood pressure stable. 6. Tobacco use.  PRN nebulizers 7. Hypokalemia.  This was replaced.  Patient tolerating diet so potassium should not be a further issue.  Code Status:      Code Status Orders  (From admission, onward)         Start     Ordered   04/02/18 1847  Full code  Continuous     04/02/18 1846        Code Status History    Date Active Date Inactive Code Status Order ID Comments User Context   05/22/2016 1823 05/22/2016 2206 Full Code [id number]  Dereck Leep, MD Inpatient      Disposition Plan: Potential disposition tomorrow after chest tube removed.  Consultants:  Cardiothoracic surgery  Time spent: 24 minutes.  Case discussed with Dr. Genevive Bi cardiothoracic surgery.  Dela Sweeny [employer]

## 2018-04-09 NOTE — Progress Notes (Signed)
Physical Therapy Treatment Patient Details Name: Joanna Morris MRN: [id number] DOB: [birth year] Today's Date: 04/09/2018    History of Present Illness Pt is a 68 year old female s/p R thoracotomy with lung resection.  PMH includes Htn, HLD and cataracts.    PT Comments    Pt agreeable to PT; denies pain. Initial O2 saturation 90%. Encouraged pursed lip breathing. Pt demonstrating mod I bed mobility and transfers. Min guard for ambulation for safety. Ambulates 370 ft at 2.5 ft/sec pace. O2 saturation post walk 85% (asymptomatic); re emphasized pursed lip breathing with recovery to 91% within 40 seconds. Pt wished return to bed versus up in chair. Continue PT to progress strength and endurance with safe O2 saturation levels with activity to improve all functional mobility and allow for a safe return home.    Follow Up Recommendations  Home health PT     Equipment Recommendations  Rolling walker with 5" wheels    Recommendations for Other Services       Precautions / Restrictions Precautions Precautions: Fall Precaution Comments: Chest tube Restrictions Weight Bearing Restrictions: No    Mobility  Bed Mobility Overal bed mobility: Modified Independent Bed Mobility: Supine to Sit;Sit to Supine     Supine to sit: Modified independent (Device/Increase time) Sit to supine: Modified independent (Device/Increase time)   General bed mobility comments: Mild increased time  Transfers Overall transfer level: Needs assistance Equipment used: Rolling walker (2 wheeled) Transfers: Sit to/from Stand Sit to Stand: Supervision         General transfer comment: Rw lowered for appropriate height   Ambulation/Gait Ambulation/Gait assistance: Min guard Gait Distance (Feet): 370 Feet Assistive device: Rolling walker (2 wheeled) Gait Pattern/deviations: Step-through pattern   Gait velocity interpretation: (2.5 ft/sec) General Gait Details: improved speed and cadence. No LOB. O2  saturation decreases to 85% (asymptomatic) post walk. Recovers to 91% within 40 seconds of reclined rest.    Stairs             Wheelchair Mobility    Modified Rankin (Stroke Patients Only)       Balance Overall balance assessment: Modified Independent                                          Cognition Arousal/Alertness: Awake/alert Behavior During Therapy: WFL for tasks assessed/performed Overall Cognitive Status: Within Functional Limits for tasks assessed                                        Exercises Other Exercises Other Exercises: Encouraged pursed lip breathing    General Comments        Pertinent Vitals/Pain Pain Assessment: No/denies pain    Home Living                      Prior Function            PT Goals (current goals can now be found in the care plan section) Progress towards PT goals: Progressing toward goals    Frequency    Min 2X/week      PT Plan Current plan remains appropriate    Co-evaluation              AM-PAC PT "6 Clicks" Mobility   Outcome Measure  Help needed  turning from your back to your side while in a flat bed without using bedrails?: A Little Help needed moving from lying on your back to sitting on the side of a flat bed without using bedrails?: A Little Help needed moving to and from a bed to a chair (including a wheelchair)?: A Little Help needed standing up from a chair using your arms (e.g., wheelchair or bedside chair)?: A Little Help needed to walk in hospital room?: A Little Help needed climbing 3-5 steps with a railing? : A Little 6 Click Score: 18    End of Session Equipment Utilized During Treatment: Gait belt Activity Tolerance: Patient tolerated treatment well;Other (comment)(O2 desaturation) Patient left: in bed;with call bell/phone within reach;Other (comment)(declines alarm)   PT Visit Diagnosis: Unsteadiness on feet (R26.81);Other  abnormalities of gait and mobility (R26.89);Muscle weakness (generalized) (M62.81)     Time: 4825-0037 PT Time Calculation (min) (ACUTE ONLY): 19 min  Charges:  $Gait Training: 8-22 mins                      Larae Grooms, PTA 04/09/2018, 4:48 PM

## 2018-04-10 ENCOUNTER — Inpatient Hospital Stay: Payer: Medicare Other

## 2018-04-10 ENCOUNTER — Telehealth: Payer: Self-pay | Admitting: *Deleted

## 2018-04-10 MED ORDER — METOPROLOL SUCCINATE ER 25 MG PO TB24: 25.0000 mg | ORAL_TABLET | Freq: Every day | ORAL | 1 refills | Status: DC

## 2018-04-10 MED ORDER — METHOCARBAMOL 500 MG PO TABS: 500.0000 mg | ORAL_TABLET | Freq: Four times a day (QID) | ORAL | 0 refills | Status: DC

## 2018-04-10 MED ORDER — METOPROLOL SUCCINATE ER 25 MG PO TB24: 25.0000 mg | ORAL_TABLET | Freq: Every day | ORAL | Status: DC

## 2018-04-10 MED ORDER — OXYCODONE HCL 5 MG PO TABS: 5.0000 mg | ORAL_TABLET | Freq: Four times a day (QID) | ORAL | 0 refills | Status: DC | PRN

## 2018-04-10 NOTE — Care Management Note (Signed)
Case Management Note  Patient Details  Name: Joanna Morris MRN: [id number] Date of Birth: 13-Jan-1951   Patient to discharge today.  Joanna Morris with [facility] notified of discharge.  RW to be delivered to room prior to discharge  Subjective/Objective:                    Action/Plan:   Expected Discharge Date:  04/10/18               Expected Discharge Plan:  [place]  In-House Referral:     Discharge planning Services  CM Consult  Post Acute Care Choice:  Durable Medical Equipment, Home Health Choice offered to:     DME Arranged:  Walker rolling DME Agency:  [place] Arranged:  RN, PT [facility] Agency:  [place]  Status of Service:  Completed, signed off  If discussed at [place] of Stay Meetings, dates discussed:    Additional Comments:  Beverly Sessions, RN 04/10/2018, 4:19 PM

## 2018-04-10 NOTE — Telephone Encounter (Signed)
Received fax for Cover my meds authorization needed for robaxin 500 mg. She will check with pharmacy and see if she needs RX and how much it is and if needed she will call back.

## 2018-04-10 NOTE — Progress Notes (Signed)
Patient ID: Joanna Morris, female   DOB: Feb 06, 1951, 68 y.o.   MRN: [id number]  [employer] PROGRESS NOTE  Kandise Riehle Hartstein XLK:[id number] DOB: [birth year] DOA: 04/02/2018 PCP: Olin Hauser, DO  HPI/Subjective: Patient feels okay.  Little discomfort at chest tube site this morning.  Objective: Vitals:   04/10/18 0550 04/10/18 0806  BP: 138/73   Pulse: (!) 57   Resp: 17   Temp: 97.8 F (36.6 C)   SpO2: 93% 95%    Filed Weights   04/02/18 1024 04/02/18 1837 04/02/18 1840  Weight: 87.5 kg 89.9 kg 89.9 kg    ROS: Review of Systems  Constitutional: Negative for chills and fever.  Eyes: Negative for blurred vision.  Respiratory: Negative for cough and shortness of breath.   Cardiovascular: Negative for chest pain.  Gastrointestinal: Negative for abdominal pain, constipation, diarrhea, nausea and vomiting.  Genitourinary: Negative for dysuria.  Musculoskeletal: Negative for joint pain.  Neurological: Negative for dizziness and headaches.   Exam: Physical Exam  Constitutional: She is oriented to person, place, and time.  HENT:  Nose: No mucosal edema.  Mouth/Throat: No oropharyngeal exudate or posterior oropharyngeal edema.  Eyes: Pupils are equal, round, and reactive to light. Conjunctivae, EOM and lids are normal.  Neck: No JVD present. Carotid bruit is not present. No edema present. No thyroid mass and no thyromegaly present.  Cardiovascular: S1 normal and S2 normal. Exam reveals no gallop.  No murmur heard. Pulses:      Dorsalis pedis pulses are 2+ on the right side and 2+ on the left side.  Respiratory: No respiratory distress. She has decreased breath sounds in the right lower field and the left lower field. She has no wheezes. She has no rhonchi. She has no rales.  GI: Soft. Bowel sounds are normal. There is no abdominal tenderness.  Musculoskeletal:     Right ankle: She exhibits no swelling.     Left ankle: She exhibits no swelling.   Lymphadenopathy:    She has no cervical adenopathy.  Neurological: She is alert and oriented to person, place, and time. No cranial nerve deficit.  Skin: Skin is warm. No rash noted. Nails show no clubbing.  Psychiatric: She has a normal mood and affect.      Data Reviewed: Basic Metabolic Panel: Recent Labs  Lab 04/05/18 0357 04/05/18 1251 04/05/18 2238 04/06/18 0451 04/08/18 0757  NA 137 137 137 136 140  K 4.1 4.0 4.1 4.1 3.5  CL 99 99 100 103 104  CO2 31 32 32 27 31  GLUCOSE 115* 125* 130* 97 93  BUN 22 29* 33* 29* 14  CREATININE 1.58* 1.94* 1.57* 1.06* 0.66  CALCIUM 8.6* 8.5* 8.2* 8.1* 8.4*   Liver Function Tests: Recent Labs  Lab 04/05/18 2238  AST 60*  ALT 40  ALKPHOS 41  BILITOT 0.7  PROT 6.3*  ALBUMIN 2.9*   CBC: Recent Labs  Lab 04/04/18 1209 04/05/18 0357 04/05/18 2238 04/06/18 0451  WBC 18.9* 19.2* 13.2* 11.7*  HGB 14.5 13.8 13.0 12.9  HCT 45.8 43.7 41.9 40.6  MCV 92.9 93.4 94.4 92.7  PLT 136* 138* 141* 141*    CBG: Recent Labs  Lab 04/03/18 1551 04/03/18 2018 04/04/18 0009 04/04/18 0423 04/04/18 0748  GLUCAP 105* 125* 127* 150* 124*    Recent Results (from the past 240 hour(s))  MRSA PCR Screening     Status: None   Collection Time: 04/02/18  6:40 PM  Result Value Ref Range Status  MRSA by PCR NEGATIVE NEGATIVE Final    Comment:        The GeneXpert MRSA Assay (FDA approved for NASAL specimens only), is one component of a comprehensive MRSA colonization surveillance program. It is not intended to diagnose MRSA infection nor to guide or monitor treatment for MRSA infections. Performed at [facility], [address] [place], [place] [address]      Studies: Dg Chest [facility] 1 View  Result Date: 04/09/2018 CLINICAL DATA:  Follow-up thoracotomy EXAM: PORTABLE CHEST 1 VIEW COMPARISON:  Yesterday FINDINGS: Right-sided chest tubes in stable position. Small right apical pneumothorax measuring 2 rib interspaces, likely  stable. Postoperative volume loss and atelectasis on the right. Cardiomegaly accentuated by low volumes. IMPRESSION: Postoperative chest with small right apical pneumothorax. Electronically Signed   By: Monte Fantasia M.D.   On: 04/09/2018 05:56    Scheduled Meds: . albuterol  2.5 mg Nebulization BID  . bisacodyl  10 mg Oral Daily  . heparin injection (subcutaneous)  5,000 Units Subcutaneous Q8H  . methocarbamol  500 mg Oral QID  . metoprolol tartrate  25 mg Oral BID  . polyethylene glycol  17 g Oral Daily  . rosuvastatin  5 mg Oral QHS  . sorbitol, milk of mag, mineral oil, glycerin (SMOG) enema  960 mL Rectal Once    Assessment/Plan:  1. Acute kidney injury.  This has resolved. 2. Acute metabolic encephalopathy.  This has improved.   3. Adenocarcinoma of the lung.  Lymph nodes were negative for malignancy.  If chest tube comes out today the patient will likely be discharged home. 4. Hyperlipidemia unspecified on Crestor 5. Hypertension.  His heart rate on the lower side will cut back metoprolol dose to Toprol-XL 25 mg daily starting tomorrow. 6. Tobacco use.  PRN nebulizers 7. Hypokalemia.  This was replaced.  Patient tolerating diet so potassium should not be a further issue.  We will sign off since patient likely home today versus tomorrow depending on when chest tube comes out.  Code Status:     Code Status Orders  (From admission, onward)         Start     Ordered   04/02/18 1847  Full code  Continuous     04/02/18 1846        Code Status History    Date Active Date Inactive Code Status Order ID Comments User Context   05/22/2016 1823 05/22/2016 2206 Full Code [id number]  Dereck Leep, MD Inpatient      Disposition Plan: Potential disposition today if chest tube comes out.  Consultants:  Cardiothoracic surgery  Time spent: 25 minutes  [place]

## 2018-04-10 NOTE — Discharge Summary (Signed)
[facility] SURGICAL DISCHARGE SUMMARY  Patient ID: Joanna Morris MRN: [id number] DOB/AGE: 1950/04/23 68 y.o.  Admit date: 04/02/2018 Discharge date: 04/10/2018  Discharge Diagnoses Patient Active Problem List   Diagnosis Date Noted  . Lung mass 04/02/2018  . Lung nodule   . Osteopenia 01/08/2017  . Allergic rhinitis 12/27/2016  . Elevated hemoglobin A1c 12/06/2016  . Elevated LDL cholesterol level 08/21/2016  . Morbid obesity ([place]) 12/10/2015  . Essential hypertension 12/10/2015  . Right medial knee pain 12/10/2015  . Environmental and seasonal allergies 12/10/2015  . Personal history of tobacco use, presenting hazards to health 12/10/2015    Consultants PCCM Medicine  Procedures 04/02/2018:  Preoperative bronchoscopy with right thoracotomy and right upper lobectomy with mediastinal lymphadenectomy  HPI: Joanna Morris is a 68 y.o. female with a RUL PET positive spiculated nodule for which she presents to [facility] on 04/02/2018 for scheduled Preoperative bronchoscopy with right thoracotomy and right upper lobectomy with mediastinal lymphadenectomy with Dr Christia [facility] Course: Informed consent was obtained and documented, and patient underwent uneventful Preoperative bronchoscopy with right thoracotomy and right upper lobectomy with mediastinal lymphadenectomy (Dr Genevive Bi, 04/02/2018).  Post-operatively, the patient developed post op fever on POD2 and was worked up without obvious source of infection. It was determined to likely be attributable to atelectasis. Otherwise, her post op period was unremarkable and her chest tubes were sequentially removed the day before discharge and on the day of discharge.The remainder of patient's hospital course was essentially unremarkable, and discharge planning was initiated accordingly with patient safely able to be discharged home with appropriate discharge instructions, pain control, and outpatient follow-up after all of  her questions were answered to her expressed satisfaction.   Discharge Condition: Good     Allergies as of 04/10/2018      Reactions   Tramadol Rash      Medication List    TAKE these medications   aspirin EC 81 MG tablet Take 1 tablet (81 mg total) by mouth daily.   CALCIUM 600/VITAMIN D PO Take 1 tablet by mouth daily.   hydrochlorothiazide 12.5 MG capsule Commonly known as:  MICROZIDE Take 1 capsule (12.5 mg total) by mouth daily.   methocarbamol 500 MG tablet Commonly known as:  ROBAXIN Take 1 tablet (500 mg total) by mouth 4 (four) times daily.   metoprolol succinate 25 MG 24 hr tablet Commonly known as:  TOPROL-XL Take 1 tablet (25 mg total) by mouth daily. Start taking on:  April 11, 2018   oxyCODONE 5 MG immediate release tablet Commonly known as:  Oxy IR/ROXICODONE Take 1 tablet (5 mg total) by mouth every 6 (six) hours as needed for severe pain or breakthrough pain.   potassium chloride SA 20 MEQ tablet Commonly known as:  K-DUR,KLOR-CON Take 1 tablet (20 mEq total) by mouth 2 (two) times daily.   rosuvastatin 5 MG tablet Commonly known as:  CRESTOR Take 1 tablet (5 mg total) by mouth at bedtime.   varenicline 0.5 MG X 11 & 1 MG X 42 tablet Commonly known as:  CHANTIX PAK Take one 0.5 mg tab by mouth once daily for 3 days, increase to one 0.5 mg twice daily for 4 days, then increase to one 1 mg twice daily.   Vitamin D3 25 MCG (1000 UT) Caps Take 1,000 Units by mouth daily.        Follow-up Information    Nestor Lewandowsky, MD. Schedule an appointment as soon as possible for a visit  in 1 week(s).   Specialties:  Cardiothoracic Surgery, General Surgery Why:  follow up next week with Dr Domenic Moras information: [address] [place] [address] [phone]        Sindy Guadeloupe, MD. Schedule an appointment as soon as possible for a visit in 2 week(s).   Specialty:  Oncology Why:  1-2 weeks Contact information: [place] [address] [phone]            -- Edison Simon , PA-C [facility]  04/10/2018, 3:02 PM [phone] M-F: 7am - 4pm

## 2018-04-10 NOTE — Progress Notes (Signed)
Physical Therapy Treatment Patient Details Name: Joanna Morris MRN: [id number] DOB: 1950-03-19 Today's Date: 04/10/2018    History of Present Illness Pt is a 68 year old female s/p R thoracotomy with lung resection.  PMH includes Htn, HLD and cataracts.    PT Comments    Pt agreeable to PT; wishes to walk. Pt reports mild pain in R flank at chest tube site. Pt progressing ambulation distance and speed this session; declines trial of stairs, as pt states she does not have steps to perform. Pt continues to desaturate during ambulation (83% after 500+ walk); encouraged rest period and pursed lip breathing at 350 ft, but pt declines. Recovers to 90% in 1 min 20 seconds with seated rest. Pt eager to return home. Pt may discharge this afternoon per MD note. Pt may benefit from pulmonary rehab post discharge.    Follow Up Recommendations  Home health PT;Other (comment)(may benefit more from pulmonary rehab)     Equipment Recommendations       Recommendations for Other Services       Precautions / Restrictions Precautions Precautions: Fall Restrictions Weight Bearing Restrictions: No    Mobility  Bed Mobility Overal bed mobility: Independent                Transfers Overall transfer level: Modified independent Equipment used: Rolling walker (2 wheeled) Transfers: Sit to/from Stand Sit to Stand: Modified independent (Device/Increase time)            Ambulation/Gait Ambulation/Gait assistance: Supervision Gait Distance (Feet): 500 Feet Assistive device: Rolling walker (2 wheeled) Gait Pattern/deviations: Step-through pattern   Gait velocity interpretation: (2.86 ft/sec) General Gait Details: Increased speed/distance. Encouraged rest at 350 feet; pt refuses wishing to continue "Im trying to get out of here". Explanation that increased walking distance/speed is not going to discharge pt any sooner, but dependent on medical condition. O2 desaturation to 83%,  asymptomatic. Recovery to 90% in 1 min 20 seconds   Stairs Stairs: (denies having steps)           Wheelchair Mobility    Modified Rankin (Stroke Patients Only)       Balance Overall balance assessment: Modified Independent                                          Cognition Arousal/Alertness: Awake/alert Behavior During Therapy: WFL for tasks assessed/performed Overall Cognitive Status: Within Functional Limits for tasks assessed                                        Exercises      General Comments        Pertinent Vitals/Pain Pain Assessment: ("A little" at tube site R flank)    Home Living                      Prior Function            PT Goals (current goals can now be found in the care plan section) Progress towards PT goals: Progressing toward goals    Frequency    Min 2X/week      PT Plan Current plan remains appropriate    Co-evaluation              AM-PAC PT "6 Clicks" Mobility  Outcome Measure  Help needed turning from your back to your side while in a flat bed without using bedrails?: None Help needed moving from lying on your back to sitting on the side of a flat bed without using bedrails?: None Help needed moving to and from a bed to a chair (including a wheelchair)?: None Help needed standing up from a chair using your arms (e.g., wheelchair or bedside chair)?: None Help needed to walk in hospital room?: None Help needed climbing 3-5 steps with a railing? : A Little 6 Click Score: 23    End of Session Equipment Utilized During Treatment: Gait belt Activity Tolerance: Patient tolerated treatment well;Other (comment)(O2 desaturation) Patient left: in bed;with call bell/phone within reach;Other (comment)(refuses up in chair)   PT Visit Diagnosis: Unsteadiness on feet (R26.81);Other abnormalities of gait and mobility (R26.89);Muscle weakness (generalized) (M62.81)     Time:  4373-5789 PT Time Calculation (min) (ACUTE ONLY): 23 min  Charges:  $Gait Training: 23-37 mins                      Larae Grooms, PTA 04/10/2018, 1:44 PM

## 2018-04-10 NOTE — Progress Notes (Addendum)
Discharge order received. Patient is alert and oriented. Vital signs stable . No signs of acute distress. Discharge instructions given. Patient verbalized understanding. Rolling walker delivered. No other issues noted at this time.

## 2018-04-10 NOTE — Progress Notes (Signed)
Patient ID: Joanna Morris, female   DOB: 07/20/1950, 68 y.o.   MRN: [id number]  She had a very quiet evening.  Pain is minimal.  Eating well.  Had several BM and this has helped.  Her thoracotomy dressings and chest tube dressings were changed yesterday.  Wounds look good  There is no air leak seen this morning so I clamped her chest tube and will repeat her chest xray later this afternoon.  If there is no change in her small apical pneumothorax, we will pull the tube and discharge to home.  I will see her in one week in the office.  She will need an appointment with Dr. Janese Banks in Oncology as well for followup  [name].

## 2018-04-11 DIAGNOSIS — C3411 Malignant neoplasm of upper lobe, right bronchus or lung: Secondary | ICD-10-CM | POA: Diagnosis not present

## 2018-04-11 DIAGNOSIS — Z902 Acquired absence of lung [part of]: Secondary | ICD-10-CM | POA: Diagnosis not present

## 2018-04-11 DIAGNOSIS — Z483 Aftercare following surgery for neoplasm: Secondary | ICD-10-CM | POA: Diagnosis not present

## 2018-04-11 DIAGNOSIS — E785 Hyperlipidemia, unspecified: Secondary | ICD-10-CM | POA: Diagnosis not present

## 2018-04-11 DIAGNOSIS — H259 Unspecified age-related cataract: Secondary | ICD-10-CM | POA: Diagnosis not present

## 2018-04-11 DIAGNOSIS — Z7982 Long term (current) use of aspirin: Secondary | ICD-10-CM | POA: Diagnosis not present

## 2018-04-11 DIAGNOSIS — K219 Gastro-esophageal reflux disease without esophagitis: Secondary | ICD-10-CM | POA: Diagnosis not present

## 2018-04-11 DIAGNOSIS — I10 Essential (primary) hypertension: Secondary | ICD-10-CM | POA: Diagnosis not present

## 2018-04-12 ENCOUNTER — Ambulatory Visit: Payer: Medicare Other

## 2018-04-14 DIAGNOSIS — Z7982 Long term (current) use of aspirin: Secondary | ICD-10-CM | POA: Diagnosis not present

## 2018-04-14 DIAGNOSIS — Z902 Acquired absence of lung [part of]: Secondary | ICD-10-CM | POA: Diagnosis not present

## 2018-04-14 DIAGNOSIS — K219 Gastro-esophageal reflux disease without esophagitis: Secondary | ICD-10-CM | POA: Diagnosis not present

## 2018-04-14 DIAGNOSIS — Z483 Aftercare following surgery for neoplasm: Secondary | ICD-10-CM | POA: Diagnosis not present

## 2018-04-14 DIAGNOSIS — E785 Hyperlipidemia, unspecified: Secondary | ICD-10-CM | POA: Diagnosis not present

## 2018-04-14 DIAGNOSIS — H259 Unspecified age-related cataract: Secondary | ICD-10-CM | POA: Diagnosis not present

## 2018-04-14 DIAGNOSIS — I10 Essential (primary) hypertension: Secondary | ICD-10-CM | POA: Diagnosis not present

## 2018-04-14 DIAGNOSIS — C3411 Malignant neoplasm of upper lobe, right bronchus or lung: Secondary | ICD-10-CM | POA: Diagnosis not present

## 2018-04-15 ENCOUNTER — Other Ambulatory Visit: Payer: Self-pay

## 2018-04-15 DIAGNOSIS — Z902 Acquired absence of lung [part of]: Secondary | ICD-10-CM | POA: Diagnosis not present

## 2018-04-15 DIAGNOSIS — E785 Hyperlipidemia, unspecified: Secondary | ICD-10-CM | POA: Diagnosis not present

## 2018-04-15 DIAGNOSIS — H259 Unspecified age-related cataract: Secondary | ICD-10-CM | POA: Diagnosis not present

## 2018-04-15 DIAGNOSIS — K219 Gastro-esophageal reflux disease without esophagitis: Secondary | ICD-10-CM | POA: Diagnosis not present

## 2018-04-15 DIAGNOSIS — Z9889 Other specified postprocedural states: Secondary | ICD-10-CM

## 2018-04-15 DIAGNOSIS — Z7982 Long term (current) use of aspirin: Secondary | ICD-10-CM | POA: Diagnosis not present

## 2018-04-15 DIAGNOSIS — I10 Essential (primary) hypertension: Secondary | ICD-10-CM | POA: Diagnosis not present

## 2018-04-15 DIAGNOSIS — Z483 Aftercare following surgery for neoplasm: Secondary | ICD-10-CM | POA: Diagnosis not present

## 2018-04-15 DIAGNOSIS — C3411 Malignant neoplasm of upper lobe, right bronchus or lung: Secondary | ICD-10-CM | POA: Diagnosis not present

## 2018-04-16 ENCOUNTER — Other Ambulatory Visit: Payer: Self-pay

## 2018-04-16 NOTE — Patient Outreach (Signed)
[place] [facility]) Care Management  04/16/2018  [place] 02-May-1950 [id number]  EMMI:  General discharge red alert Referral date: 04/25/18 Referral reason: sad/ hopeless/ anxious/ empty Insurance: [place] health care Day # 4  Telephone call to patient regarding EMMI general discharge red alert. Unable to reach patient or  leave voice message due to voice mail box not being set up.   PLAN: RNCM will attempt 2nd telephone call to patient within 4 business days.  RNCm will send patient outreach letter to attempt contact   Quinn Plowman RN,BSN,CCM [facility] Telephonic  [phone]

## 2018-04-17 ENCOUNTER — Other Ambulatory Visit: Payer: Self-pay

## 2018-04-17 ENCOUNTER — Telehealth: Payer: Self-pay | Admitting: *Deleted

## 2018-04-17 NOTE — Telephone Encounter (Signed)
Received fax for authorization for methocarbamol, she states she is doing fine pain is under control and it is not needed. Follow up Friday as scheduled.

## 2018-04-17 NOTE — Patient Outreach (Addendum)
[facility] [facility]) Care Management  04/17/2018  Joanna Morris 17-Sep-1950 [id number]  EMMI:  General discharge red alert Referral date: 04/25/18 Referral reason: sad/ hopeless/ anxious/ empty Insurance: [employer] health care Day # 4 Attempt #2  Telephone call to patient regarding EMMI general discharge red alert. Attempted call to emergency contact. Unable to reach patient. HIPAA compliant voice message left with call back phone number.  Attempted call to patients listed home number.  Unable to leave message due to voice mail box not being set up.   PLAN: RNCM will attempt 3rd telephone call to patient within 4 business days.   Quinn Plowman RN,BSN,CCM [facility] Telephonic  [phone]

## 2018-04-18 DIAGNOSIS — H259 Unspecified age-related cataract: Secondary | ICD-10-CM | POA: Diagnosis not present

## 2018-04-18 DIAGNOSIS — Z7982 Long term (current) use of aspirin: Secondary | ICD-10-CM | POA: Diagnosis not present

## 2018-04-18 DIAGNOSIS — I10 Essential (primary) hypertension: Secondary | ICD-10-CM | POA: Diagnosis not present

## 2018-04-18 DIAGNOSIS — E785 Hyperlipidemia, unspecified: Secondary | ICD-10-CM | POA: Diagnosis not present

## 2018-04-18 DIAGNOSIS — K219 Gastro-esophageal reflux disease without esophagitis: Secondary | ICD-10-CM | POA: Diagnosis not present

## 2018-04-18 DIAGNOSIS — Z483 Aftercare following surgery for neoplasm: Secondary | ICD-10-CM | POA: Diagnosis not present

## 2018-04-18 DIAGNOSIS — C3411 Malignant neoplasm of upper lobe, right bronchus or lung: Secondary | ICD-10-CM | POA: Diagnosis not present

## 2018-04-18 DIAGNOSIS — Z902 Acquired absence of lung [part of]: Secondary | ICD-10-CM | POA: Diagnosis not present

## 2018-04-19 ENCOUNTER — Encounter: Payer: Self-pay | Admitting: Cardiothoracic Surgery

## 2018-04-19 ENCOUNTER — Ambulatory Visit (INDEPENDENT_AMBULATORY_CARE_PROVIDER_SITE_OTHER): Payer: Medicare Other | Admitting: Cardiothoracic Surgery

## 2018-04-19 ENCOUNTER — Other Ambulatory Visit: Payer: Self-pay

## 2018-04-19 ENCOUNTER — Ambulatory Visit
Admission: RE | Admit: 2018-04-19 | Discharge: 2018-04-19 | Disposition: A | Discharge status: Home / Self Care | Payer: Medicare Other | Attending: Cardiothoracic Surgery | Admitting: Cardiothoracic Surgery

## 2018-04-19 ENCOUNTER — Ambulatory Visit
Admission: RE | Admit: 2018-04-19 | Discharge: 2018-04-19 | Disposition: A | Discharge status: Home / Self Care | Payer: Medicare Other | Source: Ambulatory Visit | Attending: Cardiothoracic Surgery | Admitting: Cardiothoracic Surgery

## 2018-04-19 VITALS — BP 117/78 | HR 62 | Temp 97.2°F | Ht 61.0 in | Wt 187.0 lb

## 2018-04-19 DIAGNOSIS — Z9889 Other specified postprocedural states: Secondary | ICD-10-CM

## 2018-04-19 DIAGNOSIS — C3490 Malignant neoplasm of unspecified part of unspecified bronchus or lung: Secondary | ICD-10-CM | POA: Diagnosis not present

## 2018-04-19 DIAGNOSIS — R918 Other nonspecific abnormal finding of lung field: Secondary | ICD-10-CM

## 2018-04-19 NOTE — Progress Notes (Signed)
She returns today in follow-up.  She has no specific complaints other than she is anxious to have her stitches out.  She states she is occasionally short of breath when she does some activities.  She does not complain of any significant pain.  Her lungs are distant but equal bilaterally.  She has a poor respiratory effort.  Her heart is regular.  Her thoracotomy and access incisions are healing as expected.  She did have a chest x-ray made.  That looks good without evidence of pleural effusion or pneumothorax.  She is scheduled to follow-up with Dr. Janese Banks in 1 week.  I would like to see her back again in 3 weeks for a wound check.

## 2018-04-19 NOTE — Patient Instructions (Addendum)
Return in three weeks. The patient is aware to call back for any questions or concerns.  

## 2018-04-20 DIAGNOSIS — Z902 Acquired absence of lung [part of]: Secondary | ICD-10-CM | POA: Diagnosis not present

## 2018-04-20 DIAGNOSIS — E785 Hyperlipidemia, unspecified: Secondary | ICD-10-CM | POA: Diagnosis not present

## 2018-04-20 DIAGNOSIS — K219 Gastro-esophageal reflux disease without esophagitis: Secondary | ICD-10-CM | POA: Diagnosis not present

## 2018-04-20 DIAGNOSIS — Z7982 Long term (current) use of aspirin: Secondary | ICD-10-CM | POA: Diagnosis not present

## 2018-04-20 DIAGNOSIS — I10 Essential (primary) hypertension: Secondary | ICD-10-CM | POA: Diagnosis not present

## 2018-04-20 DIAGNOSIS — H259 Unspecified age-related cataract: Secondary | ICD-10-CM | POA: Diagnosis not present

## 2018-04-20 DIAGNOSIS — Z483 Aftercare following surgery for neoplasm: Secondary | ICD-10-CM | POA: Diagnosis not present

## 2018-04-20 DIAGNOSIS — C3411 Malignant neoplasm of upper lobe, right bronchus or lung: Secondary | ICD-10-CM | POA: Diagnosis not present

## 2018-04-22 ENCOUNTER — Other Ambulatory Visit: Payer: Self-pay

## 2018-04-22 NOTE — Patient Outreach (Signed)
[facility]) Care Management  04/22/2018  Joanna Morris 11-Apr-1950 [id number]  EMMI:General discharge red alert Referral date:04/25/18 Referral reason:sad/ hopeless/ anxious/ empty Insurance: [employer] health care Day #4  Telephone call to patient regarding EMMI general discharge red alert. Unable to reach patient, HIPAA compliant message left with all back phone number. Attempted call to listed home number. Unable to leave message due to voice mail box not being set up.   PLAN:  If no return call will proceed with case closure   Quinn Plowman RN,BSN,CCM [facility] Telephonic  [phone]

## 2018-04-23 DIAGNOSIS — C3411 Malignant neoplasm of upper lobe, right bronchus or lung: Secondary | ICD-10-CM | POA: Diagnosis not present

## 2018-04-23 DIAGNOSIS — Z902 Acquired absence of lung [part of]: Secondary | ICD-10-CM | POA: Diagnosis not present

## 2018-04-23 DIAGNOSIS — Z483 Aftercare following surgery for neoplasm: Secondary | ICD-10-CM | POA: Diagnosis not present

## 2018-04-23 DIAGNOSIS — Z7982 Long term (current) use of aspirin: Secondary | ICD-10-CM | POA: Diagnosis not present

## 2018-04-23 DIAGNOSIS — E785 Hyperlipidemia, unspecified: Secondary | ICD-10-CM | POA: Diagnosis not present

## 2018-04-23 DIAGNOSIS — I10 Essential (primary) hypertension: Secondary | ICD-10-CM | POA: Diagnosis not present

## 2018-04-23 DIAGNOSIS — H259 Unspecified age-related cataract: Secondary | ICD-10-CM | POA: Diagnosis not present

## 2018-04-23 DIAGNOSIS — K219 Gastro-esophageal reflux disease without esophagitis: Secondary | ICD-10-CM | POA: Diagnosis not present

## 2018-04-25 ENCOUNTER — Inpatient Hospital Stay: Payer: Medicare Other | Admitting: Oncology

## 2018-04-25 DIAGNOSIS — I10 Essential (primary) hypertension: Secondary | ICD-10-CM | POA: Diagnosis not present

## 2018-04-25 DIAGNOSIS — Z902 Acquired absence of lung [part of]: Secondary | ICD-10-CM | POA: Diagnosis not present

## 2018-04-25 DIAGNOSIS — Z483 Aftercare following surgery for neoplasm: Secondary | ICD-10-CM | POA: Diagnosis not present

## 2018-04-25 DIAGNOSIS — Z7982 Long term (current) use of aspirin: Secondary | ICD-10-CM | POA: Diagnosis not present

## 2018-04-25 DIAGNOSIS — C3411 Malignant neoplasm of upper lobe, right bronchus or lung: Secondary | ICD-10-CM | POA: Diagnosis not present

## 2018-04-25 DIAGNOSIS — K219 Gastro-esophageal reflux disease without esophagitis: Secondary | ICD-10-CM | POA: Diagnosis not present

## 2018-04-25 DIAGNOSIS — H259 Unspecified age-related cataract: Secondary | ICD-10-CM | POA: Diagnosis not present

## 2018-04-25 DIAGNOSIS — E785 Hyperlipidemia, unspecified: Secondary | ICD-10-CM | POA: Diagnosis not present

## 2018-04-26 ENCOUNTER — Other Ambulatory Visit: Payer: Self-pay

## 2018-04-26 NOTE — Patient Outreach (Signed)
[facility] [facility]) Care Management  04/26/2018  Joanna Morris 11/07/1950 [id number]   EMMI:General discharge red alert Referral date:04/25/18 Referral reason:sad/ hopeless/ anxious/ empty Insurance: [employer] health care Day #4  Case Closure:  No response after 3 telephone calls and outreach letter attempt.  PLAN: RNCM will close patient due to being unable to reach.  RNCM will send closure notification to patient's primary MD   Quinn Plowman RN,BSN,CCM [facility] Telephonic  [phone]

## 2018-05-02 ENCOUNTER — Ambulatory Visit: Payer: Self-pay | Admitting: General Surgery

## 2018-05-02 DIAGNOSIS — E785 Hyperlipidemia, unspecified: Secondary | ICD-10-CM | POA: Diagnosis not present

## 2018-05-02 DIAGNOSIS — C3411 Malignant neoplasm of upper lobe, right bronchus or lung: Secondary | ICD-10-CM | POA: Diagnosis not present

## 2018-05-02 DIAGNOSIS — K219 Gastro-esophageal reflux disease without esophagitis: Secondary | ICD-10-CM | POA: Diagnosis not present

## 2018-05-02 DIAGNOSIS — I10 Essential (primary) hypertension: Secondary | ICD-10-CM | POA: Diagnosis not present

## 2018-05-02 DIAGNOSIS — Z483 Aftercare following surgery for neoplasm: Secondary | ICD-10-CM | POA: Diagnosis not present

## 2018-05-02 DIAGNOSIS — Z7982 Long term (current) use of aspirin: Secondary | ICD-10-CM | POA: Diagnosis not present

## 2018-05-02 DIAGNOSIS — Z902 Acquired absence of lung [part of]: Secondary | ICD-10-CM | POA: Diagnosis not present

## 2018-05-02 DIAGNOSIS — H259 Unspecified age-related cataract: Secondary | ICD-10-CM | POA: Diagnosis not present

## 2018-05-03 ENCOUNTER — Ambulatory Visit
Admission: RE | Admit: 2018-05-03 | Discharge: 2018-05-03 | Disposition: A | Discharge status: Home / Self Care | Payer: Medicare Other | Source: Ambulatory Visit | Attending: Oncology | Admitting: Oncology

## 2018-05-03 ENCOUNTER — Encounter: Payer: Self-pay | Admitting: Oncology

## 2018-05-03 ENCOUNTER — Ambulatory Visit
Admission: RE | Admit: 2018-05-03 | Discharge: 2018-05-03 | Disposition: A | Discharge status: Home / Self Care | Payer: Medicare Other | Source: Ambulatory Visit | Attending: Cardiothoracic Surgery | Admitting: Cardiothoracic Surgery

## 2018-05-03 ENCOUNTER — Inpatient Hospital Stay: Payer: Medicare Other | Attending: Oncology | Admitting: Oncology

## 2018-05-03 ENCOUNTER — Other Ambulatory Visit: Payer: Self-pay

## 2018-05-03 VITALS — BP 126/89 | HR 78 | Temp 97.8°F | Ht 61.0 in | Wt 179.3 lb

## 2018-05-03 DIAGNOSIS — R0789 Other chest pain: Secondary | ICD-10-CM | POA: Insufficient documentation

## 2018-05-03 DIAGNOSIS — Z902 Acquired absence of lung [part of]: Secondary | ICD-10-CM | POA: Diagnosis not present

## 2018-05-03 DIAGNOSIS — Z7189 Other specified counseling: Secondary | ICD-10-CM

## 2018-05-03 DIAGNOSIS — H259 Unspecified age-related cataract: Secondary | ICD-10-CM | POA: Diagnosis not present

## 2018-05-03 DIAGNOSIS — Z720 Tobacco use: Secondary | ICD-10-CM

## 2018-05-03 DIAGNOSIS — R918 Other nonspecific abnormal finding of lung field: Secondary | ICD-10-CM | POA: Diagnosis not present

## 2018-05-03 DIAGNOSIS — R634 Abnormal weight loss: Secondary | ICD-10-CM | POA: Diagnosis not present

## 2018-05-03 DIAGNOSIS — C3411 Malignant neoplasm of upper lobe, right bronchus or lung: Secondary | ICD-10-CM | POA: Diagnosis not present

## 2018-05-03 DIAGNOSIS — C3490 Malignant neoplasm of unspecified part of unspecified bronchus or lung: Secondary | ICD-10-CM | POA: Diagnosis not present

## 2018-05-03 DIAGNOSIS — R0609 Other forms of dyspnea: Secondary | ICD-10-CM | POA: Insufficient documentation

## 2018-05-03 DIAGNOSIS — Z483 Aftercare following surgery for neoplasm: Secondary | ICD-10-CM | POA: Diagnosis not present

## 2018-05-03 DIAGNOSIS — E785 Hyperlipidemia, unspecified: Secondary | ICD-10-CM | POA: Diagnosis not present

## 2018-05-03 DIAGNOSIS — Z9889 Other specified postprocedural states: Secondary | ICD-10-CM | POA: Insufficient documentation

## 2018-05-03 DIAGNOSIS — I10 Essential (primary) hypertension: Secondary | ICD-10-CM | POA: Diagnosis not present

## 2018-05-03 DIAGNOSIS — Z7982 Long term (current) use of aspirin: Secondary | ICD-10-CM | POA: Diagnosis not present

## 2018-05-03 DIAGNOSIS — K219 Gastro-esophageal reflux disease without esophagitis: Secondary | ICD-10-CM | POA: Diagnosis not present

## 2018-05-03 DIAGNOSIS — C3491 Malignant neoplasm of unspecified part of right bronchus or lung: Secondary | ICD-10-CM

## 2018-05-03 MED ORDER — GADOBUTROL 1 MMOL/ML IV SOLN
8.0000 mL | Freq: Once | INTRAVENOUS | Status: AC | PRN
  Administered 2018-05-03: 8 mL via INTRAVENOUS

## 2018-05-03 NOTE — Progress Notes (Signed)
Patient is requesting a refill on her pain medications-Methocarbamol and Oxycodone.

## 2018-05-07 DIAGNOSIS — E785 Hyperlipidemia, unspecified: Secondary | ICD-10-CM | POA: Diagnosis not present

## 2018-05-07 DIAGNOSIS — I10 Essential (primary) hypertension: Secondary | ICD-10-CM | POA: Diagnosis not present

## 2018-05-07 DIAGNOSIS — Z902 Acquired absence of lung [part of]: Secondary | ICD-10-CM | POA: Diagnosis not present

## 2018-05-07 DIAGNOSIS — Z7189 Other specified counseling: Secondary | ICD-10-CM | POA: Insufficient documentation

## 2018-05-07 DIAGNOSIS — Z7982 Long term (current) use of aspirin: Secondary | ICD-10-CM | POA: Diagnosis not present

## 2018-05-07 DIAGNOSIS — K219 Gastro-esophageal reflux disease without esophagitis: Secondary | ICD-10-CM | POA: Diagnosis not present

## 2018-05-07 DIAGNOSIS — H259 Unspecified age-related cataract: Secondary | ICD-10-CM | POA: Diagnosis not present

## 2018-05-07 DIAGNOSIS — C3411 Malignant neoplasm of upper lobe, right bronchus or lung: Secondary | ICD-10-CM | POA: Diagnosis not present

## 2018-05-07 DIAGNOSIS — C3491 Malignant neoplasm of unspecified part of right bronchus or lung: Secondary | ICD-10-CM | POA: Insufficient documentation

## 2018-05-07 DIAGNOSIS — Z483 Aftercare following surgery for neoplasm: Secondary | ICD-10-CM | POA: Diagnosis not present

## 2018-05-07 NOTE — Progress Notes (Signed)
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  She has a longstanding history of smoking and recently underwent CT. Lung cancer screening which showed a 22.4 mm irregular nodule in the right upper lobe.  This was followed by a PET CT scan which showed a spiculated 2.7 cm right upper lobe lung mass with an SUV of 26.8 consistent with primary lung cancer.  There was no evidence of hypermetabolic mediastinal or hilar adenopathy.  No evidence of metastatic disease elsewhere.  Patient referred to [place] for further recommendations.  Overall she is doing well.  She lives alone and is independent of her ADLs and IADLs.  She is also had her PFTs done which have been normal.  Reports that her appetite is good and she denies any unintentional weight loss  Patient was seen by Dr. Genevive Bi and underwent right upper lobectomy on 04/02/2018. Final pathology showed 2.5 cm adenocarcinoma, micropapillary predominant with visceral pleural invasion.  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  Benign 0.2 mm nodule of ossified cartilage.  1 lobar and 8 other lymph nodes were examined and were negative for malignancy.  Lymphovascular invasion not identified.  Margins uninvolved.  pT2A pN0   Interval history-she feels fatigued and has occasional pain at the thoracotomy site.   Reports mild shortness of breath on exertion.  Her appetite is good although she has lost 14 pounds over the last 1 month.  ECOG PS- 1 Pain scale- 5 Opioid associated constipation- no  Review of systems- Review of Systems  Constitutional: Positive for malaise/fatigue and weight loss. Negative for chills and fever.  HENT: Negative for congestion, ear discharge and nosebleeds.   Eyes: Negative for blurred vision.  Respiratory: Negative for cough, hemoptysis, sputum production, shortness of breath and wheezing.        Right chest wall pain  Cardiovascular: Negative for chest pain, palpitations, orthopnea and claudication.  Gastrointestinal: Negative for abdominal pain, blood in stool, constipation, diarrhea, heartburn, melena, nausea and vomiting.  Genitourinary: Negative for dysuria, flank pain, frequency, hematuria and urgency.  Musculoskeletal: Negative for back pain, joint pain and myalgias.  Skin: Negative for rash.  Neurological: Negative for dizziness, tingling, focal weakness, seizures, weakness and headaches.  Endo/Heme/Allergies: Does not bruise/bleed easily.  Psychiatric/Behavioral: Negative for depression and suicidal ideas. The patient does not have insomnia.       Allergies  Allergen Reactions  . Tramadol Rash     Past Medical History:  Diagnosis Date  . Allergy   . Cataract   . Genital warts   . GERD (gastroesophageal reflux disease)    H/O  . History of kidney stones   . Hyperlipidemia   . Hypertension   . Lung nodule   . Tobacco dependency      Past Surgical History:  Procedure Laterality Date  . ABDOMINAL HYSTERECTOMY    . CESAREAN SECTION     X2  . KNEE ARTHROSCOPY WITH MEDIAL MENISECTOMY Right  05/22/2016   Procedure: KNEE ARTHROSCOPY WITH PARTIAL MEDIAL MENISECTOMY;  Surgeon: Dereck Leep, MD;  Location: [facility] ORS;  Service: Orthopedics;  Laterality: Right;  . OOPHORECTOMY    . THORACOTOMY Right 04/02/2018   Procedure: THORACOTOMY AND LUNG  RESECTION WITH PREOP BRONCH;  Surgeon: Nestor Lewandowsky, MD;  Location: [facility] ORS;  Service: General;  Laterality: Right;    Social History   Socioeconomic History  . Marital status: Divorced    Spouse name: Not on file  . Number of children: 2  . Years of education: [employer]  . Highest education level: Not on file  Occupational History  . Occupation: Retired  Scientific laboratory technician  . Financial resource strain: Not very hard  . Food insecurity:    Worry: Sometimes true    Inability: Sometimes true  . Transportation needs:    Medical: No    Non-medical: No  Tobacco Use  . Smoking status: Current Every Day Smoker    Packs/day: 1.00    Years: 51.00    Pack years: 51.00    Types: Cigarettes  . Smokeless tobacco: Never Used  . Tobacco comment: Quit once for 2 years. pt has rx for chantix  Substance and Sexual Activity  . Alcohol use: Yes    Comment: half a pint of liquor every week  . Drug use: No  . Sexual activity: Not Currently    Birth control/protection: Surgical  Lifestyle  . Physical activity:    Days per week: 2 days    Minutes per session: 30 min  . Stress: Not at all  Relationships  . Social connections:    Talks on phone: More than three times a week    Gets together: More than three times a week    Attends religious service: More than 4 times per year    Active member of club or organization: No    Attends meetings of clubs or organizations: Never    Relationship status: Divorced  . Intimate partner violence:    Fear of current or ex partner: No    Emotionally abused: No    Physically abused: No    Forced sexual activity: No  Other Topics Concern  . Not on file  Social History Narrative   Lives at home by herself, independent at baseline    Family History  Problem Relation Age of Onset  . Heart disease Mother 18       MI  . Heart attack Mother 45  . Diabetes Mother   . Pancreatic cancer Sister   . Ovarian cancer Sister   . Breast cancer Neg Hx       Current Outpatient Medications:  .  aspirin EC 81 MG tablet, Take 1 tablet (81 mg total) by mouth daily., Disp: , Rfl:  .  Calcium Carbonate-Vitamin D (CALCIUM 600/VITAMIN D PO), Take 1 tablet by mouth daily., Disp: , Rfl:  .  Cholecalciferol (VITAMIN D3) 1000 units CAPS, Take 1,000 Units by mouth daily., Disp: , Rfl:  .  hydrochlorothiazide (MICROZIDE) 12.5 MG capsule, Take 1 capsule (12.5 mg total) by mouth daily., Disp: 90 capsule, Rfl: 1 .  methocarbamol (ROBAXIN) 500 MG tablet, Take 1 tablet (500 mg total) by mouth 4 (four) times daily., Disp: 30 tablet, Rfl: 0 .  metoprolol succinate (TOPROL-XL) 25 MG 24 hr tablet, Take 1 tablet (25 mg total) by mouth daily., Disp: 30 tablet, Rfl: 1 .  potassium chloride SA (K-DUR,KLOR-CON) 20 MEQ tablet, Take 1 tablet (20 mEq total) by  mouth 2 (two) times daily., Disp: 10 tablet, Rfl: 0 .  rosuvastatin (CRESTOR) 5 MG tablet, Take 1 tablet (5 mg total) by mouth at bedtime., Disp: 90 tablet, Rfl: 1 .  varenicline (CHANTIX PAK) 0.5 MG X 11 & 1 MG X 42 tablet, Take one 0.5 mg tab by mouth once daily for 3 days, increase to one 0.5 mg twice daily for 4 days, then increase to one 1 mg twice daily., Disp: 53 tablet, Rfl: 0 .  oxyCODONE (OXY IR/ROXICODONE) 5 MG immediate release tablet, Take 1 tablet (5 mg total) by mouth every 6 (six) hours as needed for severe pain or breakthrough pain. (Patient not taking: Reported on 05/03/2018), Disp: 30 tablet, Rfl: 0  Physical exam:  Vitals:   05/03/18 1309  BP: 126/89  Pulse: 78  Temp: 97.8 F (36.6 C)  TempSrc: Tympanic  Weight: 179 lb 4.8 oz (81.3 kg)  Height: 5\' 1"  (1.549 m)   Physical Exam Constitutional:      General: She is not in acute distress. HENT:     Head: Normocephalic and atraumatic.  Eyes:     Pupils: Pupils are equal, round, and reactive to light.  Neck:     Musculoskeletal: Normal range of motion.  Cardiovascular:     Rate and Rhythm: Normal rate and regular rhythm.     Heart sounds:  Normal heart sounds.  Pulmonary:     Effort: Pulmonary effort is normal.     Breath sounds: Normal breath sounds.     Comments: Thoracotomy scar noted over right back which is healing well Abdominal:     General: Bowel sounds are normal.     Palpations: Abdomen is soft.  Skin:    General: Skin is warm and dry.  Neurological:     Mental Status: She is alert and oriented to person, place, and time.      CMP Latest Ref Rng & Units 04/08/2018  Glucose 70 - 99 mg/dL 93  BUN 8 - 23 mg/dL 14  Creatinine 0.44 - 1.00 mg/dL 0.66  Sodium 135 - 145 mmol/L 140  Potassium 3.5 - 5.1 mmol/L 3.5  Chloride 98 - 111 mmol/L 104  CO2 22 - 32 mmol/L 31  Calcium 8.9 - 10.3 mg/dL 8.4(L)  Total Protein 6.5 - 8.1 g/dL -  Total Bilirubin 0.3 - 1.2 mg/dL -  Alkaline Phos 38 - 126 U/L -  AST 15 - 41 U/L -  ALT 0 - 44 U/L -   CBC Latest Ref Rng & Units 04/06/2018  WBC 4.0 - 10.5 K/uL 11.7(H)  Hemoglobin 12.0 - 15.0 g/dL 12.9  Hematocrit 36.0 - 46.0 % 40.6  Platelets 150 - 400 K/uL 141(L)    No images are attached to the encounter.  Dg Chest 2 View  Result Date: 05/03/2018 CLINICAL DATA:  History of prior thoracotomy on the right EXAM: CHEST - 2 VIEW COMPARISON:  04/19/2018 FINDINGS: Cardiac shadow is stable but mildly enlarged. Aortic calcifications are seen. Postsurgical changes are noted on right with stable blunting of right costophrenic angle. No acute bony abnormality is seen. No new focal infiltrate is noted. IMPRESSION: Postsurgical changes on the right consistent with the given clinical history. No acute abnormality noted. Electronically Signed   By: Inez Catalina M.D.   On: 05/03/2018 15:04   Chest 2 View  Result Date: 04/19/2018 CLINICAL DATA:  Status post right thoracotomy and lung resection 04/02/2018. Lung cancer. EXAM: CHEST - 2 VIEW COMPARISON:  04/10/2018 FINDINGS: The right-sided chest  tube is been removed. No residual pneumothorax is identified. A long staple line noted in the right  lateral lung. The heart remains enlarged and there is tortuosity and calcification of the thoracic aorta. Stable eventration right hemidiaphragm with overlying atelectasis and probable scarring. The left lung remains relatively clear. IMPRESSION: Removal of right-sided chest tube without definite residual pneumothorax. Stable cardiac enlargement right basilar scarring and atelectasis. Electronically Signed   By: Marijo Sanes M.D.   On: 04/19/2018 15:40   Mr Jeri Cos DX Contrast  Result Date: 05/03/2018 CLINICAL DATA:  Lung cancer staging EXAM: MRI HEAD WITHOUT AND WITH CONTRAST TECHNIQUE: Multiplanar, multiecho pulse sequences of the brain and surrounding structures were obtained without and with intravenous contrast. CONTRAST:  8 mL Gadovist IV COMPARISON:  CT head 04/05/2018 FINDINGS: Brain: Negative for metastatic disease.  No enhancing mass lesions. Negative for acute infarct. Considerable hyperintensity throughout the pons bilaterally. Scattered small periventricular deep white matter hyperintensities bilaterally. Negative for hemorrhage mass or edema. Normal enhancement. Vascular: Normal arterial flow voids Skull and upper cervical spine: Negative Sinuses/Orbits: Mucosal edema paranasal sinuses.  Normal orbit Other: None IMPRESSION: Negative for metastatic disease Considerable chronic change in the pons which is likely due to chronic microvascular ischemia. No acute infarct. Electronically Signed   By: Franchot Gallo M.D.   On: 05/03/2018 14:39   Dg Chest Port 1 View  Result Date: 04/10/2018 CLINICAL DATA:  Postop. Thoracotomy. EXAM: PORTABLE CHEST 1 VIEW COMPARISON:  04/09/2018. FINDINGS: One RIGHT chest tube remains. The other RIGHT chest tube has been removed. No definite pneumothorax. RIGHT basilar aeration is unchanged. Cardiomegaly. LEFT lung clear. IMPRESSION: One RIGHT chest tube remains. No definite pneumothorax. Electronically Signed   By: Staci Righter M.D.   On: 04/10/2018 13:14   Dg Chest  Port 1 View  Result Date: 04/09/2018 CLINICAL DATA:  Follow-up thoracotomy EXAM: PORTABLE CHEST 1 VIEW COMPARISON:  Yesterday FINDINGS: Right-sided chest tubes in stable position. Small right apical pneumothorax measuring 2 rib interspaces, likely stable. Postoperative volume loss and atelectasis on the right. Cardiomegaly accentuated by low volumes. IMPRESSION: Postoperative chest with small right apical pneumothorax. Electronically Signed   By: Monte Fantasia M.D.   On: 04/09/2018 05:56   Dg Chest Port 1 View  Result Date: 04/08/2018 CLINICAL DATA:  RT SIDE CHEST TUBE EXAM: PORTABLE CHEST - 1 VIEW COMPARISON:  the previous day's study FINDINGS: Stable right chest tubes. Small apical pneumothorax seen previously is no longer evident. Coarse perihilar and bibasilar interstitial markings as before. Heart size upper limits normal. Aortic Atherosclerosis (ICD10-170.0). Stable right paratracheal opacity. No effusion. Visualized bones unremarkable. IMPRESSION: Stable right chest tubes with no pneumothorax evident. Electronically Signed   By: Lucrezia Europe M.D.   On: 04/08/2018 07:48     Assessment and plan- Patient is a 68 y.o. female with stage Ib adenocarcinoma of the right upper lobe pT2 apN0 cM0 status post right upper lobectomy  I have reviewed and discussed the results of final pathology with the patient in detail.  It is controversial if adjuvant chemotherapy would be indicated for stage Ib adenocarcinoma of the lung.  Some studies have shown possible improvement in progression free survival but no definite improvement in overall survival.  The only risk factor noted on final pathology was visceral pleural invasion.  9 lymph nodes were negative for malignancy and she did not have any lymphovascular invasion which are positive factors.  Her surgery as such has been done with a curative intent.  Chemotherapy would typically involve 4 cycles of cisplatin based treatment.  Side effects of chemotherapy  would include all but not limited to nausea, vomiting, possible hair loss, risk of infections and hospitalizations.  Patient herself is not keen to pursue any adjuvant chemotherapy at this time.  She is still recovering from her surgery and opts for active surveillance.  I think that would be reasonable in her case  I will see her back in 3 months time with CBC with differential and CMP.  I will plan to get repeat chest CT 6 months from her surgery.  Cancer Staging Primary adenocarcinoma of right lung [facility]) Staging form: Lung, AJCC 8th Edition - Pathologic stage from 05/03/2018: Stage IB (pT2a, pN0, cM0) - Signed by Sindy Guadeloupe, MD on 05/07/2018    Total face to face encounter time for this patient visit was 40 min. >50% of the time was  spent in counseling and coordination of care.    Visit Diagnosis 1. Primary adenocarcinoma of right lung ([place])   2. Goals of care, counseling/discussion      Dr. Randa Evens, MD, MPH [facility] [phone] 05/07/2018 9:46 AM

## 2018-05-09 ENCOUNTER — Other Ambulatory Visit: Payer: Self-pay

## 2018-05-09 DIAGNOSIS — Z9889 Other specified postprocedural states: Secondary | ICD-10-CM

## 2018-05-10 ENCOUNTER — Ambulatory Visit
Admission: RE | Admit: 2018-05-10 | Discharge: 2018-05-10 | Disposition: A | Discharge status: Home / Self Care | Payer: Medicare Other | Source: Ambulatory Visit | Attending: Cardiothoracic Surgery | Admitting: Cardiothoracic Surgery

## 2018-05-10 ENCOUNTER — Other Ambulatory Visit: Payer: Self-pay

## 2018-05-10 ENCOUNTER — Ambulatory Visit (INDEPENDENT_AMBULATORY_CARE_PROVIDER_SITE_OTHER): Payer: Medicare Other | Admitting: Cardiothoracic Surgery

## 2018-05-10 ENCOUNTER — Ambulatory Visit
Admission: RE | Admit: 2018-05-10 | Discharge: 2018-05-10 | Disposition: A | Discharge status: Home / Self Care | Payer: Medicare Other | Attending: Cardiothoracic Surgery | Admitting: Cardiothoracic Surgery

## 2018-05-10 ENCOUNTER — Encounter: Payer: Self-pay | Admitting: Cardiothoracic Surgery

## 2018-05-10 VITALS — BP 91/63 | HR 89 | Temp 97.7°F | Ht 61.0 in | Wt 182.0 lb

## 2018-05-10 DIAGNOSIS — Z9889 Other specified postprocedural states: Secondary | ICD-10-CM | POA: Diagnosis not present

## 2018-05-10 DIAGNOSIS — Z09 Encounter for follow-up examination after completed treatment for conditions other than malignant neoplasm: Secondary | ICD-10-CM | POA: Diagnosis not present

## 2018-05-10 DIAGNOSIS — J986 Disorders of diaphragm: Secondary | ICD-10-CM | POA: Diagnosis not present

## 2018-05-10 NOTE — Progress Notes (Signed)
She returns today in follow-up.  She states that she has been doing very well at home.  She had a cough last week but that has resolved now.  She has no pain, fever or chills.  Unfortunately she has started smoking again.  It is now only a month since her thoracotomy and resection.  She did see Dr. Janese Banks postop and she is scheduled to follow-up with her again in 6 months.  Her chest x-ray today shows the expected postoperative changes but otherwise looks good.  Her lungs are equal and clear bilaterally.  Her heart is regular.  Her thoracotomy wound is healing as expected.  I told her I like to see her back again in 2 months time.  She did not request a refill on any of her medications.  We will obtain a chest x-ray when she comes back.

## 2018-05-10 NOTE — Addendum Note (Signed)
Addended by: Celene Kras on: 05/10/2018 09:34 AM   Modules accepted: Orders

## 2018-05-10 NOTE — Patient Instructions (Signed)
Please be sure to have your chest xray prior to seeing DR.Oaks.   Please see your follow up appointment listed below.

## 2018-05-14 DIAGNOSIS — Z483 Aftercare following surgery for neoplasm: Secondary | ICD-10-CM | POA: Diagnosis not present

## 2018-05-14 DIAGNOSIS — I10 Essential (primary) hypertension: Secondary | ICD-10-CM | POA: Diagnosis not present

## 2018-05-14 DIAGNOSIS — H259 Unspecified age-related cataract: Secondary | ICD-10-CM | POA: Diagnosis not present

## 2018-05-14 DIAGNOSIS — E785 Hyperlipidemia, unspecified: Secondary | ICD-10-CM | POA: Diagnosis not present

## 2018-05-14 DIAGNOSIS — Z902 Acquired absence of lung [part of]: Secondary | ICD-10-CM | POA: Diagnosis not present

## 2018-05-14 DIAGNOSIS — K219 Gastro-esophageal reflux disease without esophagitis: Secondary | ICD-10-CM | POA: Diagnosis not present

## 2018-05-14 DIAGNOSIS — C3411 Malignant neoplasm of upper lobe, right bronchus or lung: Secondary | ICD-10-CM | POA: Diagnosis not present

## 2018-05-14 DIAGNOSIS — Z7982 Long term (current) use of aspirin: Secondary | ICD-10-CM | POA: Diagnosis not present

## 2018-05-24 ENCOUNTER — Other Ambulatory Visit: Payer: Self-pay | Admitting: Family Medicine

## 2018-05-24 DIAGNOSIS — E7800 Pure hypercholesterolemia, unspecified: Secondary | ICD-10-CM

## 2018-07-11 ENCOUNTER — Ambulatory Visit
Admission: RE | Admit: 2018-07-11 | Discharge: 2018-07-11 | Disposition: A | Discharge status: Home / Self Care | Payer: Medicare Other | Attending: Cardiothoracic Surgery | Admitting: Cardiothoracic Surgery

## 2018-07-11 ENCOUNTER — Other Ambulatory Visit: Payer: Self-pay

## 2018-07-11 ENCOUNTER — Ambulatory Visit
Admission: RE | Admit: 2018-07-11 | Discharge: 2018-07-11 | Disposition: A | Discharge status: Home / Self Care | Payer: Medicare Other | Source: Ambulatory Visit | Attending: Cardiothoracic Surgery | Admitting: Cardiothoracic Surgery

## 2018-07-11 DIAGNOSIS — Z9889 Other specified postprocedural states: Secondary | ICD-10-CM | POA: Insufficient documentation

## 2018-07-11 NOTE — Progress Notes (Signed)
[id number]

## 2018-07-12 ENCOUNTER — Encounter: Payer: Self-pay | Admitting: Cardiothoracic Surgery

## 2018-07-12 ENCOUNTER — Telehealth (INDEPENDENT_AMBULATORY_CARE_PROVIDER_SITE_OTHER): Payer: Medicare Other | Admitting: Cardiothoracic Surgery

## 2018-07-12 DIAGNOSIS — R918 Other nonspecific abnormal finding of lung field: Secondary | ICD-10-CM

## 2018-07-12 NOTE — Progress Notes (Signed)
  Patient ID: Joanna Morris, female   DOB: Jan 30, 1951, 68 y.o.   MRN: [id number]  HISTORY: Overall she seems to be doing quite well.  She has no real complaints except for some discomfort along her right thoracotomy wound.  She describes this pain as being relieved by passing flatus however.  She also says that the seatbelt rubs on her incision.  Otherwise she appears to be getting along well.   Vitals:   07/12/18 0851  BP: 138/88  Pulse: 66  Resp: 16  Temp: 97.7 F (36.5 C)  SpO2: 94%     EXAM:    Resp: Lungs are clear bilaterally.  No respiratory distress, normal effort. Heart:  Regular without murmurs Abd:  Abdomen is soft, non distended and non tender. No masses are palpable.  There is no rebound and no guarding.  Neurological: Alert and oriented to person, place, and time. Coordination normal.  Skin: Skin is warm and dry. No rash noted. No diaphoretic. No erythema. No pallor.  Psychiatric: Normal mood and affect. Normal behavior. Judgment and thought content normal.   I have independently reviewed her chest x-ray from yesterday.  There is no pneumothorax or pleural effusion.  ASSESSMENT: Status post right thoracotomy and lung resection for lung carcinoma.   PLAN:   She will follow-up with Dr. Janese Banks later this month.  She will follow-up with her primary care physician next month.  I did not make return appointment for her but would be happy to see her should the need arise.    Nestor Lewandowsky, MD

## 2018-07-12 NOTE — Patient Instructions (Addendum)
Please continue to try to stop smoking. Please follow up with Dr.Rao.

## 2018-07-18 ENCOUNTER — Encounter: Payer: Self-pay | Admitting: Family Medicine

## 2018-07-18 ENCOUNTER — Other Ambulatory Visit: Payer: Self-pay

## 2018-07-18 ENCOUNTER — Ambulatory Visit (INDEPENDENT_AMBULATORY_CARE_PROVIDER_SITE_OTHER): Payer: Medicare Other | Admitting: Family Medicine

## 2018-07-18 ENCOUNTER — Other Ambulatory Visit: Payer: Self-pay | Admitting: Family Medicine

## 2018-07-18 DIAGNOSIS — C3491 Malignant neoplasm of unspecified part of right bronchus or lung: Secondary | ICD-10-CM | POA: Diagnosis not present

## 2018-07-18 DIAGNOSIS — R7309 Other abnormal glucose: Secondary | ICD-10-CM

## 2018-07-18 DIAGNOSIS — I10 Essential (primary) hypertension: Secondary | ICD-10-CM | POA: Diagnosis not present

## 2018-07-18 DIAGNOSIS — E7800 Pure hypercholesterolemia, unspecified: Secondary | ICD-10-CM

## 2018-07-18 DIAGNOSIS — Z0000 Encounter for general adult medical examination without abnormal findings: Secondary | ICD-10-CM

## 2018-07-18 NOTE — Assessment & Plan Note (Signed)
Followed by Oncology/Surgery S/p lobectomy of primary R adenocarcinoma, 03/2018 Did not do adjuvant chemotherapy Surveillance now per Oncology w/ imaging monitoring q 6 months

## 2018-07-18 NOTE — Assessment & Plan Note (Signed)
Controlled HTN - Home BP readings reported normal No known complications  Plan:  1. Continue current BP regimen - HCTZ 12.5mg  daily  2. Encourage improved lifestyle - low sodium diet, regular exercise - need to return to ortho for knee 3. Continue monitor BP outside office, bring readings to next visit, if persistently >140/90 or new symptoms notify office sooner

## 2018-07-18 NOTE — Assessment & Plan Note (Signed)
Elevated prior A1c >6 Had improved in past No current therapy Did not get new A1c now, will defer until next apt Follow-up 6 months w/ annual and labs

## 2018-07-18 NOTE — Patient Instructions (Addendum)
  DUE for FASTING BLOOD WORK (no food or drink after midnight before the lab appointment, only water or coffee without cream/sugar on the morning of)  SCHEDULE "Lab Only" visit in the morning at the clinic for lab draw in 6 MONTHS   - Make sure Lab Only appointment is at about 1 week before your next appointment, so that results will be available  For Lab Results, once available within 2-3 days of blood draw, you can can log in to MyChart online to view your results and a brief explanation. Also, we can discuss results at next follow-up visit.   Please schedule a Follow-up Appointment to: Return in about 6 months (around 01/18/2019) for Annual Physical.  If you have any other questions or concerns, please feel free to call the office or send a message through [place]. You may also schedule an earlier appointment if necessary.  Additionally, you may be receiving a survey about your experience at our office within a few days to 1 week by e-mail or mail. We value your feedback.  Nobie Putnam, DO [place]

## 2018-07-18 NOTE — Progress Notes (Signed)
Virtual Visit via Telephone The purpose of this virtual visit is to provide medical care while limiting exposure to the novel coronavirus (COVID19) for both patient and office staff.  Consent was obtained for phone visit:  Yes.   Answered questions that patient had about telehealth interaction:  Yes.   I discussed the limitations, risks, security and privacy concerns of performing an evaluation and management service by telephone. I also discussed with the patient that there may be a patient responsible charge related to this service. The patient expressed understanding and agreed to proceed.  Patient Location: Home Provider Location: [facility])  ---------------------------------------------------------------------- Chief Complaint  Patient presents with  . Hypertension    S: Reviewed CMA documentation. I have called patient and gathered additional HPI as follows:  Follow-up RUL Primary Adenocarcinoma Identified on screening for lung CA CT, found suspicious lung mass, referred and proceeded with Oncology and gen surgery, see interval updates - Now s/p RUL lobectomy, with curative intent. She had negative lymph nodes for malignancy, she was advised about chemotherapy and patient had opted for surveillance and monitoring without adjuvant chemotherapy at that time in 04/2018.  CHRONIC HTN: Not checking BP regularly. Recent readings appropriate. Current Meds - HCTZ 12.5mg daily Reportstookmeds today. Tolerating well, w/o complaints. Denies CP, dyspnea, HA, edema, dizziness / lightheadedness  ELEVATED A1c Last result A1c 6.2, previously elevated. Due now but unable to return to office currently Has lost weight previously Has family history mother DM Lifestyle: - Diet (Still improving diet - still reducingsweets and starch, now only limits bread, tried to inc water) - Exercise (Still walking regularly)  TOBACCO ABUSE / Nicotine dependence Previous attempt  to quit smoking 01/2018 on chantix but had reaction to it and stopped. She did well on nicotine gum almost quit but then ran out and could not get covered. Was down to 2 cig daily  Denies any high risk travel to areas of current concern for COVID19. Denies any known or suspected exposure to person with or possibly with COVID19.  Denies any fevers, chills, sweats, body ache, cough, shortness of breath, sinus pain or pressure, headache, abdominal pain, diarrhea  Past Medical History:  Diagnosis Date  . Allergy   . Cataract   . Genital warts   . GERD (gastroesophageal reflux disease)    H/O  . History of kidney stones   . Hyperlipidemia   . Hypertension   . Lung nodule   . Tobacco dependency    Social History   Tobacco Use  . Smoking status: Current Every Day Smoker    Packs/day: 1.00    Years: 51.00    Pack years: 51.00    Types: Cigarettes  . Smokeless tobacco: Never Used  . Tobacco comment: Quit once for 2 years. pt has rx for chantix  Substance Use Topics  . Alcohol use: Yes    Comment: half a pint of liquor every week  . Drug use: No    Current Outpatient Medications:  .  aspirin EC 81 MG tablet, Take 1 tablet (81 mg total) by mouth daily., Disp: , Rfl:  .  Calcium Carbonate-Vitamin D (CALCIUM 600/VITAMIN D PO), Take 1 tablet by mouth daily., Disp: , Rfl:  .  Cholecalciferol (VITAMIN D3) 1000 units CAPS, Take 1,000 Units by mouth daily., Disp: , Rfl:  .  hydrochlorothiazide (MICROZIDE) 12.5 MG capsule, Take 1 capsule (12.5 mg total) by mouth daily., Disp: 90 capsule, Rfl: 1 .  potassium chloride SA (K-DUR,KLOR-CON) 20 MEQ tablet,  Take 1 tablet (20 mEq total) by mouth 2 (two) times daily., Disp: 10 tablet, Rfl: 0 .  rosuvastatin (CRESTOR) 5 MG tablet, TAKE 1 TABLET BY MOUTH AT BEDTIME, Disp: 90 tablet, Rfl: 1 .  metoprolol succinate (TOPROL-XL) 25 MG 24 hr tablet, Take 1 tablet (25 mg total) by mouth daily. (Patient not taking: Reported on 07/18/2018), Disp: 30 tablet, Rfl:  1  Depression screen [facility] 2/9 07/18/2018 03/13/2018 01/16/2018  Decreased Interest 0 0 0  Down, Depressed, Hopeless 0 0 0  PHQ - 2 Score 0 0 0    No flowsheet data found.  -------------------------------------------------------------------------- O: No physical exam performed due to remote telephone encounter.  Lab results reviewed.  No results found for this or any previous visit (from the past 2160 hour(s)).  -------------------------------------------------------------------------- A&P:  Problem List Items Addressed This Visit    Elevated hemoglobin A1c    Elevated prior A1c >6 Had improved in past No current therapy Did not get new A1c now, will defer until next apt Follow-up 6 months w/ annual and labs      Essential hypertension - Primary    Controlled HTN - Home BP readings reported normal No known complications  Plan:  1. Continue current BP regimen - HCTZ 12.5mg  daily  2. Encourage improved lifestyle - low sodium diet, regular exercise - need to return to ortho for knee 3. Continue monitor BP outside office, bring readings to next visit, if persistently >140/90 or new symptoms notify office sooner      Primary adenocarcinoma of right lung [facility])    Followed by Oncology/Surgery S/p lobectomy of primary R adenocarcinoma, 03/2018 Did not do adjuvant chemotherapy Surveillance now per Oncology w/ imaging monitoring q 6 months         No orders of the defined types were placed in this encounter.   Follow-up: - Return in 6 months for Annual Physical - Future labs ordered for 01/13/19  Patient verbalizes understanding with the above medical recommendations including the limitation of remote medical advice.  Specific follow-up and call-back criteria were given for patient to follow-up or seek medical care more urgently if needed.   - Time spent in direct consultation with patient on phone: 8 minutes  Nobie Putnam, [facility] 07/18/2018, 8:09 AM

## 2018-08-01 ENCOUNTER — Other Ambulatory Visit: Payer: Self-pay

## 2018-08-01 ENCOUNTER — Ambulatory Visit: Payer: Self-pay | Admitting: Oncology

## 2018-09-04 ENCOUNTER — Other Ambulatory Visit: Payer: Self-pay

## 2018-09-04 DIAGNOSIS — C3491 Malignant neoplasm of unspecified part of right bronchus or lung: Secondary | ICD-10-CM

## 2018-09-05 ENCOUNTER — Encounter: Payer: Self-pay | Admitting: *Deleted

## 2018-09-05 ENCOUNTER — Encounter: Payer: Self-pay | Admitting: Oncology

## 2018-09-05 ENCOUNTER — Inpatient Hospital Stay: Payer: Medicare Other | Admitting: Oncology

## 2018-09-05 ENCOUNTER — Inpatient Hospital Stay: Payer: Medicare Other | Attending: Oncology

## 2018-09-05 ENCOUNTER — Other Ambulatory Visit: Payer: Self-pay

## 2018-09-05 DIAGNOSIS — C3491 Malignant neoplasm of unspecified part of right bronchus or lung: Secondary | ICD-10-CM | POA: Insufficient documentation

## 2018-09-05 LAB — COMPREHENSIVE METABOLIC PANEL
ALT: 104 U/L — ABNORMAL HIGH (ref 0–44)
AST: 141 U/L — ABNORMAL HIGH (ref 15–41)
Albumin: 3.6 g/dL (ref 3.5–5.0)
Alkaline Phosphatase: 52 U/L (ref 38–126)
Anion gap: 8 (ref 5–15)
BUN: 18 mg/dL (ref 8–23)
CO2: 27 mmol/L (ref 22–32)
Calcium: 8.6 mg/dL — ABNORMAL LOW (ref 8.9–10.3)
Chloride: 109 mmol/L (ref 98–111)
Creatinine, Ser: 0.8 mg/dL (ref 0.44–1.00)
GFR calc Af Amer: >60 mL/min (ref >60)
GFR calc non Af Amer: >60 mL/min (ref >60)
Glucose, Bld: 97 mg/dL (ref 70–99)
Potassium: 4 mmol/L (ref 3.5–5.1)
Sodium: 144 mmol/L (ref 135–145)
Total Bilirubin: 0.6 mg/dL (ref 0.3–1.2)
Total Protein: 7.4 g/dL (ref 6.5–8.1)

## 2018-09-05 LAB — CBC WITH DIFFERENTIAL/PLATELET
Abs Immature Granulocytes: 0 10*3/uL (ref 0.00–0.07)
Basophils Absolute: 0 10*3/uL (ref 0.0–0.1)
Basophils Relative: 1 %
Eosinophils Absolute: 0.1 10*3/uL (ref 0.0–0.5)
Eosinophils Relative: 2 %
HCT: 42.9 % (ref 36.0–46.0)
Hemoglobin: 14.2 g/dL (ref 12.0–15.0)
Immature Granulocytes: 0 %
Lymphocytes Relative: 65 %
Lymphs Abs: 2.6 10*3/uL (ref 0.7–4.0)
MCH: 29.2 pg (ref 26.0–34.0)
MCHC: 33.1 g/dL (ref 30.0–36.0)
MCV: 88.1 fL (ref 80.0–100.0)
Monocytes Absolute: 0.3 10*3/uL (ref 0.1–1.0)
Monocytes Relative: 7 %
Neutro Abs: 1 10*3/uL — ABNORMAL LOW (ref 1.7–7.7)
Neutrophils Relative %: 25 %
Platelets: 122 10*3/uL — ABNORMAL LOW (ref 150–400)
RBC: 4.87 MIL/uL (ref 3.87–5.11)
RDW: 13.2 % (ref 11.5–15.5)
WBC: 4 10*3/uL (ref 4.0–10.5)
nRBC: 0 % (ref 0.0–0.2)

## 2018-09-05 NOTE — Progress Notes (Unsigned)
Patient here today for follow up.  Patient states no new concerns today  

## 2018-09-10 ENCOUNTER — Inpatient Hospital Stay: Payer: Medicare Other | Admitting: Oncology

## 2018-09-23 ENCOUNTER — Other Ambulatory Visit: Payer: Self-pay

## 2018-09-23 DIAGNOSIS — C3491 Malignant neoplasm of unspecified part of right bronchus or lung: Secondary | ICD-10-CM

## 2018-09-24 ENCOUNTER — Inpatient Hospital Stay: Payer: Medicare Other

## 2018-09-24 ENCOUNTER — Inpatient Hospital Stay: Payer: Medicare Other | Admitting: Oncology

## 2018-11-18 ENCOUNTER — Other Ambulatory Visit: Payer: Self-pay | Admitting: Family Medicine

## 2018-11-18 DIAGNOSIS — I10 Essential (primary) hypertension: Secondary | ICD-10-CM

## 2018-11-18 NOTE — Telephone Encounter (Signed)
Attempted to call patient, I was not aware she was on Metoprolol XL 25mg  daily still.  It does not look like she has a current rx.  Could you call her to clarify if she takes this every day for her BP and if she is requesting refill? Or was this a prior medication request being sent to [place] by pharmacy.  Nobie Putnam, [facility] 11/18/2018, 4:59 PM

## 2018-11-28 NOTE — Telephone Encounter (Signed)
Spoke to the patient she is not taking metoprolol it was given to her from the hospital and she wants refill for HCTZ so can refuse metoprolol and send HCTZ she has appt scheduled in 01/2019.

## 2018-11-29 MED ORDER — HYDROCHLOROTHIAZIDE 12.5 MG PO CAPS: 12.5000 mg | ORAL_CAPSULE | Freq: Every day | ORAL | 1 refills | Status: DC

## 2018-11-30 ENCOUNTER — Other Ambulatory Visit: Payer: Self-pay | Admitting: Family Medicine

## 2018-11-30 DIAGNOSIS — E7800 Pure hypercholesterolemia, unspecified: Secondary | ICD-10-CM

## 2018-12-09 ENCOUNTER — Other Ambulatory Visit: Payer: Self-pay | Admitting: Family Medicine

## 2018-12-09 DIAGNOSIS — Z1231 Encounter for screening mammogram for malignant neoplasm of breast: Secondary | ICD-10-CM

## 2018-12-21 LAB — FECAL OCCULT BLOOD, IMMUNOCHEMICAL: IFOBT: NEGATIVE

## 2019-01-13 ENCOUNTER — Other Ambulatory Visit: Payer: Self-pay

## 2019-01-13 ENCOUNTER — Other Ambulatory Visit: Payer: Medicare Other

## 2019-01-13 DIAGNOSIS — I10 Essential (primary) hypertension: Secondary | ICD-10-CM

## 2019-01-13 DIAGNOSIS — Z0000 Encounter for general adult medical examination without abnormal findings: Secondary | ICD-10-CM | POA: Diagnosis not present

## 2019-01-13 DIAGNOSIS — E7800 Pure hypercholesterolemia, unspecified: Secondary | ICD-10-CM

## 2019-01-13 DIAGNOSIS — R7309 Other abnormal glucose: Secondary | ICD-10-CM

## 2019-01-14 LAB — HEMOGLOBIN A1C
Hgb A1c MFr Bld: 5.6 % of total Hgb (ref <5.7)
Mean Plasma Glucose: 114 (calc)
eAG (mmol/L): 6.3 (calc)

## 2019-01-14 LAB — COMPLETE METABOLIC PANEL WITH GFR
AG Ratio: 1.2 (calc) (ref 1.0–2.5)
ALT: 157 U/L — ABNORMAL HIGH (ref 6–29)
AST: 163 U/L — ABNORMAL HIGH (ref 10–35)
Albumin: 3.6 g/dL (ref 3.6–5.1)
Alkaline phosphatase (APISO): 62 U/L (ref 37–153)
BUN: 19 mg/dL (ref 7–25)
CO2: 29 mmol/L (ref 20–32)
Calcium: 9.2 mg/dL (ref 8.6–10.4)
Chloride: 104 mmol/L (ref 98–110)
Creat: 0.83 mg/dL (ref 0.60–0.93)
GFR, Est African American: 83 mL/min/{1.73_m2} (ref >=60)
GFR, Est Non African American: 71 mL/min/{1.73_m2} (ref >=60)
Globulin: 3 g/dL (calc) (ref 1.9–3.7)
Glucose, Bld: 96 mg/dL (ref 65–139)
Potassium: 4 mmol/L (ref 3.5–5.3)
Sodium: 140 mmol/L (ref 135–146)
Total Bilirubin: 0.5 mg/dL (ref 0.2–1.2)
Total Protein: 6.6 g/dL (ref 6.1–8.1)

## 2019-01-14 LAB — CBC WITH DIFFERENTIAL/PLATELET
Absolute Monocytes: 475 cells/uL (ref 200–950)
Basophils Absolute: 19 cells/uL (ref 0–200)
Basophils Relative: 0.4 %
Eosinophils Absolute: 61 cells/uL (ref 15–500)
Eosinophils Relative: 1.3 %
HCT: 43.4 % (ref 35.0–45.0)
Hemoglobin: 14.4 g/dL (ref 11.7–15.5)
Lymphs Abs: 2265 cells/uL (ref 850–3900)
MCH: 29.1 pg (ref 27.0–33.0)
MCHC: 33.2 g/dL (ref 32.0–36.0)
MCV: 87.9 fL (ref 80.0–100.0)
MPV: 12.2 fL (ref 7.5–12.5)
Monocytes Relative: 10.1 %
Neutro Abs: 1880 cells/uL (ref 1500–7800)
Neutrophils Relative %: 40 %
Platelets: 109 10*3/uL — ABNORMAL LOW (ref 140–400)
RBC: 4.94 10*6/uL (ref 3.80–5.10)
RDW: 12.3 % (ref 11.0–15.0)
Total Lymphocyte: 48.2 %
WBC: 4.7 10*3/uL (ref 3.8–10.8)

## 2019-01-14 LAB — LIPID PANEL
Cholesterol: 140 mg/dL (ref <200)
HDL: 64 mg/dL (ref >=50)
LDL Cholesterol (Calc): 59 mg/dL (calc)
Non-HDL Cholesterol (Calc): 76 mg/dL (calc) (ref <130)
Total CHOL/HDL Ratio: 2.2 (calc) (ref <5.0)
Triglycerides: 83 mg/dL (ref <150)

## 2019-01-17 ENCOUNTER — Ambulatory Visit (INDEPENDENT_AMBULATORY_CARE_PROVIDER_SITE_OTHER): Payer: Medicare Other | Admitting: Family Medicine

## 2019-01-17 ENCOUNTER — Ambulatory Visit
Admission: RE | Admit: 2019-01-17 | Discharge: 2019-01-17 | Disposition: A | Discharge status: Home / Self Care | Payer: Medicare Other | Source: Ambulatory Visit | Attending: Family Medicine | Admitting: Family Medicine

## 2019-01-17 ENCOUNTER — Encounter: Payer: Self-pay | Admitting: Family Medicine

## 2019-01-17 ENCOUNTER — Other Ambulatory Visit: Payer: Self-pay

## 2019-01-17 ENCOUNTER — Other Ambulatory Visit: Payer: Self-pay | Admitting: *Deleted

## 2019-01-17 VITALS — BP 122/76 | HR 70 | Temp 98.6°F | Resp 16 | Ht 60.0 in | Wt 179.0 lb

## 2019-01-17 DIAGNOSIS — R7309 Other abnormal glucose: Secondary | ICD-10-CM

## 2019-01-17 DIAGNOSIS — R7989 Other specified abnormal findings of blood chemistry: Secondary | ICD-10-CM

## 2019-01-17 DIAGNOSIS — Z1231 Encounter for screening mammogram for malignant neoplasm of breast: Secondary | ICD-10-CM | POA: Insufficient documentation

## 2019-01-17 DIAGNOSIS — Z0000 Encounter for general adult medical examination without abnormal findings: Secondary | ICD-10-CM | POA: Diagnosis not present

## 2019-01-17 DIAGNOSIS — Z23 Encounter for immunization: Secondary | ICD-10-CM

## 2019-01-17 DIAGNOSIS — C3491 Malignant neoplasm of unspecified part of right bronchus or lung: Secondary | ICD-10-CM

## 2019-01-17 DIAGNOSIS — I10 Essential (primary) hypertension: Secondary | ICD-10-CM | POA: Diagnosis not present

## 2019-01-17 DIAGNOSIS — M8580 Other specified disorders of bone density and structure, unspecified site: Secondary | ICD-10-CM

## 2019-01-17 DIAGNOSIS — E7800 Pure hypercholesterolemia, unspecified: Secondary | ICD-10-CM | POA: Diagnosis not present

## 2019-01-17 DIAGNOSIS — E669 Obesity, unspecified: Secondary | ICD-10-CM

## 2019-01-17 NOTE — Progress Notes (Signed)
Subjective:    Patient ID: Joanna Morris, female    DOB: September 20, 1950, 68 y.o.   MRN: [id number]  Joanna Morris is a 67 y.o. female presenting on 01/17/2019 for Annual Exam   HPI   Here for Annual Physical and Lab Review.  Follow-up RUL Primary Adenocarcinoma Identified on screening for lung CA CT, found suspicious lung mass, referred and proceeded with Oncology and gen surgery, see interval updates - Now s/p RUL lobectomy, with curative intent. She had negative lymph nodes for malignancy, she was advised about chemotherapy and patient had opted for surveillance and monitoring without adjuvant chemotherapy at that time in 04/2018. - She had blood work and follow up cancelled with [facility] Oncology, will determine her follow up plan  Elevated LFTs Started Rosuvastatin 5mg  in 2018, but LFT didn't elevate until 02/2018 or 2020 New problem for her, previous records 2017 were normal. Now significant increase to 150-160s on both AST ALT. Relatively asymptomatic, has some discomfort RUQ but thought it was due to her lung surgery.  CHRONIC HTN: Not checking BP regularly. Recent readings appropriate. Current Meds - HCTZ 12.5mg daily Reportstookmeds today. Tolerating well, w/o complaints. Denies CP, dyspnea, HA, edema, dizziness / lightheadedness  ELEVATED A1c Improved A1c down to 5.6, previously 6.2. Has family history mother DM Lifestyle: - Weight down to 179 lbs - Diet (Still improving diet -still reducingsweets and starch, now only limits bread, tried to inc water) - Exercise (Stillwalkingregularly)  HYPERLIPIDEMIA: - Reports no concerns. Last lipid panel 01/2019, controlled  - Currently taking Rosuvastatin 5mg , tolerating well without side effects or myalgias Note she started Rosuvastatin in 2018, had no issue with elevated LFTs at that time for up to a year.  TOBACCO ABUSE / Nicotine dependence Previous attempt to quit smoking 01/2018 on chantix but had reaction to it and  stopped. She did well on nicotine gum almost quit but then ran out and could not get covered. Was down to 2 cig daily  PMHChronic R knee pain, s/p R arthroscopic partial medial meniscectomy  Health Maintenance: -Due for Flu Shot, will receive today  Breast CA Screening: DUE Mammogram scheduled TODAY 01/17/19. Last mammogram result negative [facility] . No prior history abnormal mammogram. No known family history of breast cancer. Currently asymptomatic.  Osteopenia - DEXA 2018. Now due for repeat.  FIT - 12/2018 negative.  Depression screen [facility] 2/9 01/17/2019 07/18/2018 03/13/2018  Decreased Interest 0 0 0  Down, Depressed, Hopeless 0 0 0  PHQ - 2 Score 0 0 0    Past Medical History:  Diagnosis Date  . Allergy   . Cataract   . Genital warts   . GERD (gastroesophageal reflux disease)    H/O  . History of kidney stones   . Hyperlipidemia   . Hypertension   . Lung nodule   . Tobacco dependency    Past Surgical History:  Procedure Laterality Date  . ABDOMINAL HYSTERECTOMY    . CESAREAN SECTION     X2  . KNEE ARTHROSCOPY WITH MEDIAL MENISECTOMY Right 05/22/2016   Procedure: KNEE ARTHROSCOPY WITH PARTIAL MEDIAL MENISECTOMY;  Surgeon: Dereck Leep, MD;  Location: [facility] ORS;  Service: Orthopedics;  Laterality: Right;  . OOPHORECTOMY    . THORACOTOMY Right 04/02/2018   Procedure: THORACOTOMY AND LUNG RESECTION WITH PREOP BRONCH;  Surgeon: Nestor Lewandowsky, MD;  Location: [facility] ORS;  Service: General;  Laterality: Right;   Social History   Socioeconomic History  . Marital status: Divorced    Spouse name:  Not on file  . Number of children: 2  . Years of education: [employer]  . Highest education level: Not on file  Occupational History  . Occupation: Retired  Scientific laboratory technician  . Financial resource strain: Not very hard  . Food insecurity    Worry: Sometimes true    Inability: Sometimes true  . Transportation needs    Medical: No    Non-medical: No  Tobacco Use  . Smoking  status: Current Every Day Smoker    Packs/day: 1.00    Years: 51.00    Pack years: 51.00    Types: Cigarettes  . Smokeless tobacco: Never Used  . Tobacco comment: Quit once for 2 years. pt has rx for chantix  Substance and Sexual Activity  . Alcohol use: Yes    Comment: half a pint of liquor every week  . Drug use: No  . Sexual activity: Not Currently    Birth control/protection: Surgical  Lifestyle  . Physical activity    Days per week: 2 days    Minutes per session: 30 min  . Stress: Not at all  Relationships  . Social connections    Talks on phone: More than three times a week    Gets together: More than three times a week    Attends religious service: More than 4 times per year    Active member of club or organization: No    Attends meetings of clubs or organizations: Never    Relationship status: Divorced  . Intimate partner violence    Fear of current or ex partner: No    Emotionally abused: No    Physically abused: No    Forced sexual activity: No  Other Topics Concern  . Not on file  Social History Narrative   Lives at home by herself, independent at baseline   Family History  Problem Relation Age of Onset  . Heart disease Mother 21       MI  . Heart attack Mother 14  . Diabetes Mother   . Pancreatic cancer Sister   . Ovarian cancer Sister   . Breast cancer Neg Hx    Current Outpatient Medications on File Prior to Visit  Medication Sig  . aspirin EC 81 MG tablet Take 1 tablet (81 mg total) by mouth daily.  . Calcium Carbonate-Vitamin D (CALCIUM 600/VITAMIN D PO) Take 1 tablet by mouth daily.  . Cholecalciferol (VITAMIN D3) 1000 units CAPS Take 1,000 Units by mouth daily.  . hydrochlorothiazide (MICROZIDE) 12.5 MG capsule Take 1 capsule (12.5 mg total) by mouth daily.  . metoprolol succinate (TOPROL-XL) 25 MG 24 hr tablet Take 1 tablet (25 mg total) by mouth daily.  . potassium chloride SA (K-DUR,KLOR-CON) 20 MEQ tablet Take 1 tablet (20 mEq total) by mouth  2 (two) times daily.  . rosuvastatin (CRESTOR) 5 MG tablet TAKE 1 TABLET BY MOUTH AT BEDTIME   No current facility-administered medications on file prior to visit.     Review of Systems Per HPI unless specifically indicated above      Objective:    BP 122/76   Pulse 70   Temp 98.6 F (37 C) (Oral)   Resp 16   Ht 5' (1.524 m)   Wt 179 lb (81.2 kg)   BMI 34.96 kg/m   Wt Readings from Last 3 Encounters:  01/17/19 179 lb (81.2 kg)  07/12/18 181 lb 12.8 oz (82.5 kg)  05/10/18 182 lb (82.6 kg)    Physical Exam Vitals  signs and nursing note reviewed.  Constitutional:      General: She is not in acute distress.    Appearance: She is well-developed. She is not diaphoretic.     Comments: Well-appearing, comfortable, cooperative  HENT:     Head: Normocephalic and atraumatic.  Eyes:     General:        Right eye: No discharge.        Left eye: No discharge.     Conjunctiva/sclera: Conjunctivae normal.     Pupils: Pupils are equal, round, and reactive to light.  Neck:     Musculoskeletal: Normal range of motion and neck supple.     Thyroid: No thyromegaly.  Cardiovascular:     Rate and Rhythm: Normal rate and regular rhythm.     Heart sounds: Normal heart sounds. No murmur.  Pulmonary:     Effort: Pulmonary effort is normal. No respiratory distress.     Breath sounds: Normal breath sounds. No wheezing or rales.  Abdominal:     General: Bowel sounds are normal. There is no distension.     Palpations: Abdomen is soft. There is no mass.     Tenderness: There is abdominal tenderness (Mild tender to deep palpation RUQ).     Comments: No abnormal liver margin on exam / percussion  Musculoskeletal: Normal range of motion.        General: No tenderness.     Comments: Upper / Lower Extremities: - Normal muscle tone, strength bilateral upper extremities 5/5, lower extremities 5/5  Lymphadenopathy:     Cervical: No cervical adenopathy.  Skin:    General: Skin is warm and dry.      Findings: No erythema or rash.  Neurological:     Mental Status: She is alert and oriented to person, place, and time.     Comments: Distal sensation intact to light touch all extremities  Psychiatric:        Behavior: Behavior normal.     Comments: Well groomed, good eye contact, normal speech and thoughts       Results for orders placed or performed in visit on 01/13/19  Lipid panel  Result Value Ref Range   Cholesterol 140 <200 mg/dL   HDL 64 > OR = 50 mg/dL   Triglycerides 83 <150 mg/dL   LDL Cholesterol (Calc) 59 mg/dL (calc)   Total CHOL/HDL Ratio 2.2 <5.0 (calc)   Non-HDL Cholesterol (Calc) 76 <130 mg/dL (calc)  COMPLETE METABOLIC PANEL WITH GFR  Result Value Ref Range   Glucose, Bld 96 65 - 139 mg/dL   BUN 19 7 - 25 mg/dL   Creat 0.83 0.60 - 0.93 mg/dL   GFR, Est Non African American 71 > OR = 60 mL/min/1.71m2   GFR, Est African American 83 > OR = 60 mL/min/1.73m2   BUN/Creatinine Ratio NOT APPLICABLE 6 - 22 (calc)   Sodium 140 135 - 146 mmol/L   Potassium 4.0 3.5 - 5.3 mmol/L   Chloride 104 98 - 110 mmol/L   CO2 29 20 - 32 mmol/L   Calcium 9.2 8.6 - 10.4 mg/dL   Total Protein 6.6 6.1 - 8.1 g/dL   Albumin 3.6 3.6 - 5.1 g/dL   Globulin 3.0 1.9 - 3.7 g/dL (calc)   AG Ratio 1.2 1.0 - 2.5 (calc)   Total Bilirubin 0.5 0.2 - 1.2 mg/dL   Alkaline phosphatase (APISO) 62 37 - 153 U/L   AST 163 (H) 10 - 35 U/L   ALT 157 (H) 6 -  29 U/L  CBC with Differential/Platelet  Result Value Ref Range   WBC 4.7 3.8 - 10.8 Thousand/uL   RBC 4.94 3.80 - 5.10 Million/uL   Hemoglobin 14.4 11.7 - 15.5 g/dL   HCT 43.4 35.0 - 45.0 %   MCV 87.9 80.0 - 100.0 fL   MCH 29.1 27.0 - 33.0 pg   MCHC 33.2 32.0 - 36.0 g/dL   RDW 12.3 11.0 - 15.0 %   Platelets 109 (L) 140 - 400 Thousand/uL   MPV 12.2 7.5 - 12.5 fL   Neutro Abs 1,880 1,500 - 7,800 cells/uL   Lymphs Abs 2,265 850 - 3,900 cells/uL   Absolute Monocytes 475 200 - 950 cells/uL   Eosinophils Absolute 61 15 - 500 cells/uL    Basophils Absolute 19 0 - 200 cells/uL   Neutrophils Relative % 40 %   Total Lymphocyte 48.2 %   Monocytes Relative 10.1 %   Eosinophils Relative 1.3 %   Basophils Relative 0.4 %  Hemoglobin A1c  Result Value Ref Range   Hgb A1c MFr Bld 5.6 <5.7 % of total Hgb   Mean Plasma Glucose 114 (calc)   eAG (mmol/L) 6.3 (calc)      Assessment & Plan:   Problem List Items Addressed This Visit    Primary adenocarcinoma of right lung [facility])    S/p lobectomy primary R adenocarcinoma Lost to follow-up after previous surveillance Contacted Oncology [facility] to reach out to patient resume CT imaging, to be scheduled      Osteopenia    Last dexa 2018 Due for repeat, ordered patient to schedule      Relevant Orders   DG Bone Density   Obesity (BMI 30.0-34.9)    Stable weight Encourage diet lifestyle exercise      Essential hypertension    Controlled HTN - Home BP readings reported normal No known complications  Plan:  1. Continue current BP regimen - HCTZ 12.5mg  daily  2. Encourage improved lifestyle - low sodium diet, regular exercise - need to return to ortho for knee 3. Continue monitor BP outside office, bring readings to next visit, if persistently >140/90 or new symptoms notify office sooner      Elevated LFTs   Relevant Orders   Ambulatory referral to Gastroenterology   Elevated LDL cholesterol level    Controlled cholesterol on statin / lifestyle Last lipid panel 01/2019 Calculated ASCVD 10 yr risk score 22.5% as smoker, if quit down to 12%  Plan: 1. Continue current meds - Rosuvastatin 5mg  daily 2. Continue ASA 81mg  for primary ASCVD risk reduction 3. Encourage improved lifestyle - low carb/cholesterol, reduce portion size, continue improving regular exercise 4. Follow-up yearly lipid       Elevated hemoglobin A1c    Significantly improved A1c to 5.6 Encourage lifestyle       Other Visit Diagnoses    Annual physical exam    -  Primary   Needs flu shot        Relevant Orders   [facility] High Dose 2020+ Flu Vaccine QUAD High Dose(Fluad) (Completed)      Updated Health Maintenance information Flu shot DEXA Reviewed recent lab results with patient Encouraged improvement to lifestyle with diet and exercise - Goal of weight loss   #Elevated LFTs Referral to [place] - for elevated LFTs AST 163 ALT 157, has had gradual trend increasing since 02/2018 but now significant change. She is s/p Right lung adenocarcinoma surgical excision without other chemotherapy or other treatment. She  has controlled cholesterol on low dose rosuvastatin 5mg  and was on this for >1 year prior to elevation LFT. She has some RUQ discomfort at times from the lung surgery.  - Will try to get in touch with GI prior to scheduling her apt - so that we can determine if any imaging is requested by them prior to her apt.     Orders Placed This Encounter  Procedures  . DG Bone Density    Standing Status:   Future    Standing Expiration Date:   03/18/2020    Order Specific Question:   Reason for Exam (SYMPTOM  OR DIAGNOSIS REQUIRED)    Answer:   osteopenia previous 2018, due for follow-up    Order Specific Question:   Preferred imaging location?    Answer:   [facility]  . [facility] High Dose 2020+ Flu Vaccine QUAD High Dose(Fluad)  . Ambulatory referral to Gastroenterology    Referral Priority:   Routine    Referral Type:   Consultation    Referral Reason:   Specialty Services Required    Number of Visits Requested:   1     No orders of the defined types were placed in this encounter.   Follow up plan: Return in about 6 months (around 07/17/2019) for 6 month follow-up Elevated A1c, HTN, updates.  Nobie Putnam, DO [facility] 01/17/2019, 9:15 AM

## 2019-01-17 NOTE — Patient Instructions (Addendum)
Thank you for coming to the office today.  For DEXA Scan (Bone mineral density) screening for osteoporosis  Call the [place] below anytime to schedule your own appointment now that order has been placed.  [facility] [place], [place] [address] Phone: [phone]  We will refer you to GI gastro specialist to discuss elevated Liver enzymes on blood test, it looks like gradual increase in past year, but now had most significant change   Please schedule a Follow-up Appointment to: Return in about 6 months (around 07/17/2019) for 6 month follow-up Elevated A1c, HTN, updates.  If you have any other questions or concerns, please feel free to call the office or send a message through [place]. You may also schedule an earlier appointment if necessary.  Additionally, you may be receiving a survey about your experience at our office within a few days to 1 week by e-mail or mail. We value your feedback.  Nobie Putnam, DO [place]

## 2019-01-18 NOTE — Assessment & Plan Note (Signed)
Stable weight Encourage diet lifestyle exercise

## 2019-01-18 NOTE — Assessment & Plan Note (Signed)
Last dexa 2018 Due for repeat, ordered patient to schedule

## 2019-01-18 NOTE — Assessment & Plan Note (Signed)
Significantly improved A1c to 5.6 Encourage lifestyle

## 2019-01-18 NOTE — Assessment & Plan Note (Signed)
Controlled cholesterol on statin / lifestyle Last lipid panel 01/2019 Calculated ASCVD 10 yr risk score 22.5% as smoker, if quit down to 12%  Plan: 1. Continue current meds - Rosuvastatin 5mg  daily 2. Continue ASA 81mg  for primary ASCVD risk reduction 3. Encourage improved lifestyle - low carb/cholesterol, reduce portion size, continue improving regular exercise 4. Follow-up yearly lipid

## 2019-01-18 NOTE — Assessment & Plan Note (Signed)
Controlled HTN - Home BP readings reported normal No known complications  Plan:  1. Continue current BP regimen - HCTZ 12.5mg  daily  2. Encourage improved lifestyle - low sodium diet, regular exercise - need to return to ortho for knee 3. Continue monitor BP outside office, bring readings to next visit, if persistently >140/90 or new symptoms notify office sooner

## 2019-01-18 NOTE — Assessment & Plan Note (Signed)
S/p lobectomy primary R adenocarcinoma Lost to follow-up after previous surveillance Contacted Oncology [facility] CC to reach out to patient resume CT imaging, to be scheduled

## 2019-02-03 ENCOUNTER — Ambulatory Visit
Admission: RE | Admit: 2019-02-03 | Discharge: 2019-02-03 | Disposition: A | Discharge status: Home / Self Care | Payer: Medicare Other | Source: Ambulatory Visit | Attending: Oncology | Admitting: Oncology

## 2019-02-03 ENCOUNTER — Other Ambulatory Visit: Payer: Self-pay

## 2019-02-03 DIAGNOSIS — C3491 Malignant neoplasm of unspecified part of right bronchus or lung: Secondary | ICD-10-CM | POA: Insufficient documentation

## 2019-02-03 DIAGNOSIS — C3490 Malignant neoplasm of unspecified part of unspecified bronchus or lung: Secondary | ICD-10-CM | POA: Diagnosis not present

## 2019-02-03 MED ORDER — IOHEXOL 300 MG/ML  SOLN
75.0000 mL | Freq: Once | INTRAMUSCULAR | Status: AC | PRN
  Administered 2019-02-03: 75 mL via INTRAVENOUS

## 2019-02-06 ENCOUNTER — Inpatient Hospital Stay: Payer: Medicare Other | Attending: Oncology | Admitting: Oncology

## 2019-02-06 ENCOUNTER — Encounter: Payer: Self-pay | Admitting: Oncology

## 2019-02-06 ENCOUNTER — Other Ambulatory Visit: Payer: Self-pay

## 2019-02-06 VITALS — BP 124/82 | HR 91 | Temp 97.8°F | Wt 178.0 lb

## 2019-02-06 DIAGNOSIS — Z08 Encounter for follow-up examination after completed treatment for malignant neoplasm: Secondary | ICD-10-CM

## 2019-02-06 DIAGNOSIS — R5383 Other fatigue: Secondary | ICD-10-CM | POA: Diagnosis not present

## 2019-02-06 DIAGNOSIS — F17210 Nicotine dependence, cigarettes, uncomplicated: Secondary | ICD-10-CM | POA: Insufficient documentation

## 2019-02-06 DIAGNOSIS — R0789 Other chest pain: Secondary | ICD-10-CM | POA: Insufficient documentation

## 2019-02-06 DIAGNOSIS — Z85118 Personal history of other malignant neoplasm of bronchus and lung: Secondary | ICD-10-CM

## 2019-02-06 DIAGNOSIS — C3411 Malignant neoplasm of upper lobe, right bronchus or lung: Secondary | ICD-10-CM | POA: Diagnosis not present

## 2019-02-06 NOTE — Progress Notes (Signed)
Patient stated that she has some tenderness under her right breast at times. Patient had been told that she might hit a nerve with her bras.

## 2019-02-07 NOTE — Progress Notes (Signed)
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 She has a longstanding history of smoking and recently underwent CT.Lung cancer screening which showed a 22.4 mm irregular nodule in the right upper lobe. This was followed by a PET CT scan which showed a spiculated 2.7 cm right upper lobe lung mass with an SUV of 26.8 consistent with primary lung cancer. There was no evidence of hypermetabolic mediastinal or hilar adenopathy. No evidence of metastatic disease elsewhere. Patient referred to [place] for further recommendations. Overall she is doing well. She lives alone and is independent of her ADLs and IADLs. She is also had her PFTs done which have been normal. Reports that her appetite is good and she denies any unintentional weight loss  Patient was seen by Dr. Genevive Bi and underwent right upper lobectomy on 04/02/2018. Final pathology showed 2.5 cm adenocarcinoma, micropapillary predominant with visceral pleural invasion.  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  Benign 0.2 mm nodule of ossified cartilage.  1 lobar and 8 other lymph nodes were examined and were negative for malignancy.  Lymphovascular invasion not identified.  Margins uninvolved.  pT2A pN0.  After risks and benefits discussion patient chose not to proceed with adjuvant chemotherapy  Interval history-patient  reports occasional right-sided chest wall pain since her surgery.  Reports chronic fatigue.  Denies other complaints at this time  ECOG PS- 1 Pain scale- 0 Opioid associated constipation- no  Review of systems- Review of Systems  Constitutional: Positive for malaise/fatigue. Negative for chills, fever and weight loss.  HENT: Negative for congestion, ear discharge and nosebleeds.   Eyes: Negative for blurred vision.  Respiratory: Negative for cough, hemoptysis, sputum production, shortness of breath and wheezing.        Right chest wall pain  Cardiovascular: Negative for chest pain, palpitations, orthopnea and claudication.  Gastrointestinal: Negative for abdominal pain, blood in stool, constipation, diarrhea, heartburn, melena, nausea and vomiting.  Genitourinary: Negative for dysuria, flank pain, frequency, hematuria and urgency.  Musculoskeletal: Negative for back pain, joint pain and myalgias.  Skin: Negative for rash.  Neurological: Negative for dizziness, tingling, focal weakness, seizures, weakness and headaches.  Endo/Heme/Allergies: Does not bruise/bleed easily.  Psychiatric/Behavioral: Negative for depression and suicidal ideas. The patient does not have insomnia.        Allergies  Allergen Reactions   Tramadol Rash     Past Medical History:  Diagnosis Date   Allergy    Cataract    Genital warts    GERD (gastroesophageal reflux disease)    H/O   History of kidney stones    Hyperlipidemia    Hypertension    Lung nodule    Tobacco dependency      Past Surgical History:  Procedure Laterality Date   ABDOMINAL HYSTERECTOMY     CESAREAN SECTION     X2   KNEE ARTHROSCOPY WITH MEDIAL MENISECTOMY Right 05/22/2016   Procedure: KNEE ARTHROSCOPY WITH PARTIAL MEDIAL MENISECTOMY;  Surgeon:  Dereck Leep, MD;  Location: [facility] ORS;  Service: Orthopedics;  Laterality: Right;   OOPHORECTOMY     THORACOTOMY Right 04/02/2018   Procedure: THORACOTOMY AND LUNG  RESECTION WITH PREOP BRONCH;  Surgeon: Nestor Lewandowsky, MD;  Location: [facility] ORS;  Service: General;  Laterality: Right;    Social History   Socioeconomic History   Marital status: Divorced    Spouse name: Not on file   Number of children: 2   Years of education: High School   Highest education level: Not on file  Occupational History   Occupation: Retired  Scientist, product/process development strain: Not very hard   Food insecurity    Worry: Sometimes true    Inability: Sometimes true   Transportation needs    Medical: No    Non-medical: No  Tobacco Use   Smoking status: Current Every Day Smoker    Packs/day: 1.00    Years: 51.00    Pack years: 51.00    Types: Cigarettes   Smokeless tobacco: Never Used   Tobacco comment: Quit once for 2 years. pt has rx for chantix  Substance and Sexual Activity   Alcohol use: Yes    Comment: half a pint of liquor every week   Drug use: No   Sexual activity: Not Currently    Birth control/protection: Surgical  Lifestyle   Physical activity    Days per week: 2 days    Minutes per session: 30 min   Stress: Not at all  Relationships   Social connections    Talks on phone: More than three times a week    Gets together: More than three times a week    Attends religious service: More than 4 times per year    Active member of club or organization: No    Attends meetings of clubs or organizations: Never    Relationship status: Divorced   Intimate partner violence    Fear of current or ex partner: No    Emotionally abused: No    Physically abused: No    Forced sexual activity: No  Other Topics Concern   Not on file  Social History Narrative   Lives at home by herself, independent at baseline    Family History  Problem Relation Age of Onset   Heart disease Mother 29       MI   Heart attack Mother 46   Diabetes Mother    Pancreatic cancer Sister    Ovarian cancer Sister    Breast cancer Neg Hx       Current Outpatient Medications:    aspirin EC 81 MG tablet, Take 1 tablet (81 mg total) by mouth daily., Disp: , Rfl:    Calcium Carbonate-Vitamin D (CALCIUM 600/VITAMIN D PO), Take 1 tablet by mouth daily., Disp: , Rfl:    Cholecalciferol (VITAMIN D3) 1000 units CAPS, Take 1,000 Units by mouth daily., Disp: , Rfl:    hydrochlorothiazide (MICROZIDE) 12.5 MG capsule, Take 1 capsule (12.5 mg total) by mouth daily., Disp: 90 capsule, Rfl: 1   metoprolol succinate (TOPROL-XL) 25 MG 24 hr tablet, Take 1 tablet (25 mg total) by mouth daily., Disp: 30 tablet, Rfl: 1   potassium chloride SA (K-DUR,KLOR-CON) 20 MEQ tablet, Take 1 tablet (20 mEq total) by mouth 2 (two) times daily., Disp: 10 tablet, Rfl: 0   rosuvastatin (CRESTOR) 5 MG tablet, TAKE 1 TABLET BY MOUTH AT BEDTIME (Patient not taking: Reported on 02/06/2019), Disp: 90 tablet, Rfl: 1  Physical exam:  Vitals:   02/06/19 1153 02/06/19 1158  BP: 124/82   Pulse: 91   Temp: 97.8 F (36.6 C)   TempSrc: Tympanic   SpO2:  98%  Weight: 178 lb (80.7 kg)    Physical Exam HENT:     Head: Normocephalic and atraumatic.  Eyes:     Pupils: Pupils are equal, round, and reactive to light.  Neck:     Musculoskeletal: Normal range of motion.  Cardiovascular:     Rate and Rhythm: Normal rate and regular rhythm.     Heart sounds: Normal heart sounds.  Pulmonary:     Effort: Pulmonary effort is normal.     Breath sounds: Normal breath sounds.     Comments: Thoracotomy scar noted on the right side posteriorly which is healed well Abdominal:     General: Bowel sounds are normal.     Palpations: Abdomen is soft.  Skin:    General: Skin is warm and dry.  Neurological:     Mental Status: She is alert and oriented to person, place, and time.      CMP Latest Ref Rng & Units 01/13/2019  Glucose 65 - 139 mg/dL 96  BUN 7 - 25 mg/dL 19  Creatinine 0.60 - 0.93 mg/dL 0.83  Sodium 135 - 146 mmol/L 140  Potassium 3.5 - 5.3 mmol/L 4.0   Chloride 98 - 110 mmol/L 104  CO2 20 - 32 mmol/L 29  Calcium 8.6 - 10.4 mg/dL 9.2  Total Protein 6.1 - 8.1 g/dL 6.6  Total Bilirubin 0.2 - 1.2 mg/dL 0.5  Alkaline Phos 38 - 126 U/L -  AST 10 - 35 U/L 163(H)  ALT 6 - 29 U/L 157(H)   CBC Latest Ref Rng & Units 01/13/2019  WBC 3.8 - 10.8 Thousand/uL 4.7  Hemoglobin 11.7 - 15.5 g/dL 14.4  Hematocrit 35.0 - 45.0 % 43.4  Platelets 140 - 400 Thousand/uL 109(L)    No images are attached to the encounter.  Ct Chest W Contrast  Result Date: 02/03/2019 CLINICAL DATA:  Lung cancer. EXAM: CT CHEST WITH CONTRAST TECHNIQUE: Multidetector CT imaging of the chest was performed during intravenous contrast administration. CONTRAST:  18mL OMNIPAQUE IOHEXOL 300 MG/ML  SOLN COMPARISON:  02/19/2018. FINDINGS: Cardiovascular: Atherosclerotic calcification of the aorta. Heart is mildly enlarged. No pericardial effusion. Mediastinum/Nodes: Subcentimeter low-attenuation lesion in the right lobe of the thyroid. No follow-up recommended(ref: J Am Coll Radiol. 2015 Feb;12(2): 143-50). No pathologically enlarged mediastinal, hilar or axillary lymph nodes. Esophagus is grossly unremarkable. Lungs/Pleura: Right upper lobectomy. Postoperative changes are seen along the major fissure. Probable mild smoking related respiratory bronchiolitis. No pleural fluid. Airway is otherwise unremarkable. Upper Abdomen: Liver appears slightly decreased in attenuation diffusely. Low-attenuation lesions in the liver measure up to 1.5 cm and are stable from 02/19/2018. Visualized portions of the liver, gallbladder, adrenal glands, kidneys, spleen, pancreas, stomach and bowel are unremarkable with exception of a small hiatal hernia. Gastrohepatic ligament lymph node measures 10 mm, similar. Periportal lymph nodes measure up to 9 mm, also similar. Musculoskeletal: Degenerative changes in the spine. IMPRESSION: 1. No evidence of recurrent or metastatic disease. 2. Hepatic steatosis. 3.  Aortic  atherosclerosis (ICD10-170.0). 4.  Emphysema (ICD10-J43.9). Electronically Signed   By: Lorin Picket M.D.   On: 02/03/2019 13:13   Mm 3d Screen Breast Bilateral  Result Date: 01/17/2019 CLINICAL DATA:  Screening. EXAM: DIGITAL SCREENING BILATERAL MAMMOGRAM WITH TOMO AND CAD COMPARISON:  Previous exam(s). ACR Breast Density Category b: There are scattered  areas of fibroglandular density. FINDINGS: There are no findings suspicious for malignancy. Images were processed with CAD. IMPRESSION: No mammographic evidence of malignancy. A result letter of this screening mammogram will be mailed directly to the patient. RECOMMENDATION: Screening mammogram in one year. (Code:SM-B-01Y) BI-RADS CATEGORY  1: Negative. Electronically Signed   By: Dorise Bullion III M.D   On: 01/17/2019 13:22     Assessment and plan- Patient is a 68 y.o. 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  She is currently in remission and under active surveillance for her lung cancer and here for routine follow-up  I have reviewed CT chest images independently and discussed findings with the patient.  Patient is roughly 11 months since her surgery.  Her most recent CT scan images on 02/03/2019 was reviewed by me independently.  I also discussed the findings with the patient.  Currently patient is doing well since her surgery and there is no concern for recurrence based on today's scans.  I will see her back in 6 months time with repeat CT chest prior.  She does have right-sided chest wall pain which is likely postthoracotomy pain which can be chronic.  If her pain worsens neuropathic agents such as gabapentin could be considered.   Visit Diagnosis 1. Encounter for follow-up surveillance of lung cancer      Dr. Randa Evens, MD, MPH [facility] at [facility] [phone] 02/07/2019 1:06 PM

## 2019-03-11 ENCOUNTER — Other Ambulatory Visit: Payer: Self-pay | Admitting: Family Medicine

## 2019-03-11 DIAGNOSIS — E7800 Pure hypercholesterolemia, unspecified: Secondary | ICD-10-CM

## 2019-03-13 ENCOUNTER — Telehealth: Payer: Self-pay | Admitting: Family Medicine

## 2019-03-13 NOTE — Chronic Care Management (AMB) (Signed)
  Chronic Care Management   Note  03/13/2019 Name: TRYPHENA PERKOVICH MRN: [id number] DOB: 1951-02-09  Phylliss Blakes Ohlendorf is a 69 y.o. year old female who is a primary care patient of Olin Hauser, DO. I reached out to [employer] by phone today in response to a referral sent by Ms. Phylliss Blakes Goto's health plan.     Ms. Boberg was given information about Chronic Care Management services today including:  1. CCM service includes personalized support from designated clinical staff supervised by her physician, including individualized plan of care and coordination with other care providers 2. 24/7 contact phone numbers for assistance for urgent and routine care needs. 3. Service will only be billed when office clinical staff spend 20 minutes or more in a month to coordinate care. 4. Only one practitioner may furnish and bill the service in a calendar month. 5. The patient may stop CCM services at any time (effective at the end of the month) by phone call to the office staff. 6. The patient will be responsible for cost sharing (co-pay) of up to 20% of the service fee (after annual deductible is met).  Patient did not agree to enrollment in care management services and does not wish to consider at this time.  Follow up plan: The patient has been provided with contact information for the care management team and has been advised to call with any health related questions or concerns.   [place], [place] [address] Direct Dial: [email or web address]  Website: [email or web address]

## 2019-03-18 ENCOUNTER — Ambulatory Visit (INDEPENDENT_AMBULATORY_CARE_PROVIDER_SITE_OTHER): Payer: Medicare Other

## 2019-03-18 ENCOUNTER — Other Ambulatory Visit: Payer: Self-pay

## 2019-03-18 VITALS — BP 140/72 | HR 68 | Temp 98.5°F | Ht 60.0 in | Wt 181.0 lb

## 2019-03-18 DIAGNOSIS — Z0000 Encounter for general adult medical examination without abnormal findings: Secondary | ICD-10-CM | POA: Diagnosis not present

## 2019-03-18 NOTE — Progress Notes (Signed)
Subjective:   Joanna Morris is a 69 y.o. female who presents for Medicare Annual (Subsequent) preventive examination.  Review of Systems:   Cardiac Risk Factors include: advanced age (>52men, >64 women);dyslipidemia;hypertension;smoking/ tobacco exposure     Objective:     Vitals: BP 140/72 (BP Location: Left Arm, Patient Position: Sitting, Cuff Size: Normal)   Pulse 68   Temp 98.5 F (36.9 C) (Oral)   Ht 5' (1.524 m)   Wt 181 lb (82.1 kg)   BMI 35.35 kg/m   Body mass index is 35.35 kg/m.  Advanced Directives 03/18/2019 02/06/2019 09/05/2018 05/03/2018 04/02/2018 03/15/2018 03/13/2018  Does Patient Have a Medical Advance Directive? No No No No No No No  Type of Advance Directive - - - - - - -  Copy of Healthcare Power of Attorney in Chart? - - - - - - -  Would patient like information on creating a medical advance directive? - No - Patient declined - No - Patient declined No - Patient declined No - Patient declined No - Patient declined    Tobacco Social History   Tobacco Use  Smoking Status Current Every Day Smoker  . Packs/day: 0.50  . Years: 51.00  . Pack years: 25.50  . Types: Cigarettes  Smokeless Tobacco Never Used  Tobacco Comment   Quit once for 2 years. pt using nicorette gum      Ready to quit: Yes Counseling given: Yes Comment: Quit once for 2 years. pt using nicorette gum    Clinical Intake:  Pre-visit preparation completed: Yes  Pain : No/denies pain     Nutritional Status: BMI > 30  Obese Nutritional Risks: None Diabetes: No  How often do you need to have someone help you when you read instructions, pamphlets, or other written materials from your doctor or pharmacy?: 1 - Never  Interpreter Needed?: No  Information entered by :: Deveion Denz,LPN  Past Medical History:  Diagnosis Date  . Allergy   . Cataract   . Genital warts   . GERD (gastroesophageal reflux disease)    H/O  . History of kidney stones   . Hyperlipidemia   .  Hypertension   . Lung nodule   . Tobacco dependency    Past Surgical History:  Procedure Laterality Date  . ABDOMINAL HYSTERECTOMY    . CESAREAN SECTION     X2  . KNEE ARTHROSCOPY WITH MEDIAL MENISECTOMY Right 05/22/2016   Procedure: KNEE ARTHROSCOPY WITH PARTIAL MEDIAL MENISECTOMY;  Surgeon: Dereck Leep, MD;  Location: [facility] ORS;  Service: Orthopedics;  Laterality: Right;  . OOPHORECTOMY    . THORACOTOMY Right 04/02/2018   Procedure: THORACOTOMY AND LUNG RESECTION WITH PREOP BRONCH;  Surgeon: Nestor Lewandowsky, MD;  Location: [facility] ORS;  Service: General;  Laterality: Right;   Family History  Problem Relation Age of Onset  . Heart disease Mother 42       MI  . Heart attack Mother 29  . Diabetes Mother   . Pancreatic cancer Sister   . Ovarian cancer Sister   . Breast cancer Neg Hx    Social History   Socioeconomic History  . Marital status: Divorced    Spouse name: Not on file  . Number of children: 2  . Years of education: [employer]  . Highest education level: Not on file  Occupational History  . 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 Smoking status: Current  Every Day Smoker    Packs/day: 0.50    Years: 51.00    Pack years: 25.50    Types: Cigarettes  . Smokeless tobacco: Never Used  . Tobacco comment: Quit once for 2 years. pt using nicorette gum   Substance and Sexual Activity  . Alcohol use: Yes    Comment: half a pint of liquor every week  . Drug use: No  . Sexual activity: Not Currently    Birth control/protection: Surgical  Other Topics Concern  . Not on file  Social History Narrative   Lives at home by herself, independent at baseline   Social Determinants of Health   Financial Resource Strain:   . Difficulty of Paying Living Expenses: Not on file  Food Insecurity:   . Worried About Charity fundraiser in the Last Year: Not on file  . Ran Out of Food in the Last Year: Not on file  Transportation Needs:   .  Lack of Transportation (Medical): Not on file  . Lack of Transportation (Non-Medical): Not on file  Physical Activity:   . Days of Exercise per Week: Not on file  . Minutes of Exercise per Session: Not on file  Stress:   . Feeling of Stress : Not on file  Social Connections:   . Frequency of Communication with Friends and Family: Not on file  . Frequency of Social Gatherings with Friends and Family: Not on file  . Attends Religious Services: Not on file  . Active Member of Clubs or Organizations: Not on file  . Attends Archivist Meetings: Not on file  . Marital Status: Not on file    Outpatient Encounter Medications as of 03/18/2019  Medication Sig  . aspirin EC 81 MG tablet Take 1 tablet (81 mg total) by mouth daily.  . Calcium Carbonate-Vitamin D (CALCIUM 600/VITAMIN D PO) Take 1 tablet by mouth daily.  . Cholecalciferol (VITAMIN D3) 1000 units CAPS Take 1,000 Units by mouth daily.  . hydrochlorothiazide (MICROZIDE) 12.5 MG capsule Take 1 capsule (12.5 mg total) by mouth daily.  . potassium chloride SA (K-DUR,KLOR-CON) 20 MEQ tablet Take 1 tablet (20 mEq total) by mouth 2 (two) times daily.  . rosuvastatin (CRESTOR) 5 MG tablet TAKE 1 TABLET BY MOUTH AT BEDTIME  . metoprolol succinate (TOPROL-XL) 25 MG 24 hr tablet Take 1 tablet (25 mg total) by mouth daily. (Patient not taking: Reported on 03/18/2019)   No facility-administered encounter medications on file as of 03/18/2019.    Activities of Daily Living In your present state of health, do you have any difficulty performing the following activities: 03/18/2019 04/03/2018  Hearing? N -  Comment no hearing aids -  Vision? N -  Comment reading glasses, goes to [facility] -  Difficulty concentrating or making decisions? N -  Walking or climbing stairs? N -  Dressing or bathing? N -  Doing errands, shopping? N N  Preparing Food and eating ? N -  Using the Toilet? N -  In the past six months, have you accidently  leaked urine? N -  Do you have problems with loss of bowel control? N -  Managing your Medications? N -  Managing your Finances? N -  Housekeeping or managing your Housekeeping? N -  Some recent data might be hidden    Patient Care Team: Olin Hauser, DO as PCP - General (Family Medicine) Hooten, Laurice Record, MD as Consulting Physician (Orthopedic Surgery) Telford Nab, RN as Registered Nurse  Assessment:   This is a routine wellness examination for Elaine.  Exercise Activities and Dietary recommendations Current Exercise Habits: Home exercise routine, Type of exercise: walking, Time (Minutes): 40, Frequency (Times/Week): 7, Weekly Exercise (Minutes/Week): 280, Intensity: Mild, Exercise limited by: None identified  Goals    . Exercise 150 minutes per week (moderate activity)     Recommend gradually increasing distance walked from 1 mile to 2 miles    . Quit Smoking     Pt would like to stop smoking completely.       Fall Risk: Fall Risk  03/18/2019 01/17/2019 07/18/2018 05/10/2018 04/19/2018  Falls in the past year? 0 0 0 0 0  Number falls in past yr: 0 0 - 0 -  Injury with Fall? 0 - - 0 -  Risk for fall due to : - - - - -  Risk for fall due to: Comment - - - - -  Follow up - Falls evaluation completed Falls evaluation completed - -    FALL RISK PREVENTION PERTAINING TO THE HOME:  Any stairs in or around the home? No  If so, are there any without handrails? No   Home free of loose throw rugs in walkways, pet beds, electrical cords, etc? Yes  Adequate lighting in your home to reduce risk of falls? Yes   ASSISTIVE DEVICES UTILIZED TO PREVENT FALLS:  Life alert? No  Use of a cane, walker or w/c? No  Grab bars in the bathroom? No  Shower chair or bench in shower? No  Elevated toilet seat or a handicapped toilet? No   DME ORDERS:  DME order needed?  No   TIMED UP AND GO:  Was the test performed? yes Length of time to ambulate 10 feet: 8 sec.    GAIT:  Appearance of gait: Gait steady and fast without the use of an assistive device.  Education: Fall risk prevention has been discussed.  Intervention(s) required? No   DME/home health order needed?  No    Depression Screen PHQ 2/9 Scores 03/18/2019 01/17/2019 07/18/2018 03/13/2018  PHQ - 2 Score 0 0 0 0     Cognitive Function     6CIT Screen 03/18/2019 03/13/2018 12/05/2016  What Year? 0 points 0 points 0 points  What month? 0 points 0 points 0 points  What time? 0 points 0 points 0 points  Count back from 20 0 points 0 points 0 points  Months in reverse 0 points 0 points 0 points  Repeat phrase 0 points 2 points 2 points  Total Score 0 2 2    Immunization History  Administered Date(s) Administered  . Fluad Quad(high Dose 65+) 01/17/2019  . Influenza, High Dose Seasonal PF 12/27/2016, 01/16/2018    Qualifies for Shingles Vaccine? Yes  Zostavax completed n/a. Due for Shingrix. Education has been provided regarding the importance of this vaccine. Pt has been advised to call insurance company to determine out of pocket expense. Advised may also receive vaccine at local pharmacy or Health Dept. Verbalized acceptance and understanding.  Tdap: Although this vaccine is not a covered service during a Wellness Exam, does the patient still wish to receive this vaccine today?  No .  Education has been provided regarding the importance of this vaccine. Advised may receive this vaccine at local pharmacy or Health Dept. Aware to provide a copy of the vaccination record if obtained from local pharmacy or Health Dept. Verbalized acceptance and understanding.  Flu Vaccine: up to date   Pneumococcal  Vaccine: Due for Pneumococcal vaccine. Does the patient want to receive this vaccine today?  No . Education has been provided regarding the importance of this vaccine but still declined. Advised may receive this vaccine at local pharmacy or Health Dept. Aware to provide a copy of the vaccination  record if obtained from local pharmacy or Health Dept. Verbalized acceptance and understanding.   Screening Tests Health Maintenance  Topic Date Due  . PNA vac Low Risk Adult (1 of 2 - PCV13) 01/16/2021 (Originally 05/16/2013)  . TETANUS/TDAP  12/09/2025 (Originally 05/17/1969)  . COLON CANCER SCREENING ANNUAL FOBT  12/21/2019  . MAMMOGRAM  01/16/2021  . COLONOSCOPY  12/09/2025  . INFLUENZA VACCINE  Completed  . DEXA SCAN  Completed  . Hepatitis C Screening  Completed    Cancer Screenings:  Colorectal Screening: Completed 2017. Repeat every 10 years; FOBT done 12/21/2018  Mammogram: Completed 01/17/2019. Repeat every year  Bone Density: Completed 2018. Ordered.   Lung Cancer Screening: (Low Dose CT Chest recommended if Age 43-80 years, 30 pack-year currently smoking OR have quit w/in 15years.) does qualify.   Completed   Additional Screening:  Hepatitis C Screening: does qualify; Completed 2012  Vision Screening: Recommended annual ophthalmology exams for early detection of glaucoma and other disorders of the eye. Is the patient up to date with their annual eye exam?  Yes  Who is the provider or what is the name of the office in which the pt attends annual eye exams? [facility]   Dental Screening: Recommended annual dental exams for proper oral hygiene  Community Resource Referral:  CRR required this visit?  No       Plan:  I have personally reviewed and addressed the Medicare Annual Wellness questionnaire and have noted the following in the patient's chart:  A. Medical and social history B. Use of alcohol, tobacco or illicit drugs  C. Current medications and supplements D. Functional ability and status E.  Nutritional status F.  Physical activity G. Advance directives H. List of other physicians I.  Hospitalizations, surgeries, and ER visits in previous 12 months J.  [place] such as hearing and vision if needed, cognitive and  depression L. Referrals and appointments   In addition, I have reviewed and discussed with patient certain preventive protocols, quality metrics, and best practice recommendations. A written personalized care plan for preventive services as well as general preventive health recommendations were provided to patient.  Signed,    Bevelyn Ngo, LPN  [id number] Nurse Health Advisor   Nurse Notes: none

## 2019-03-18 NOTE — Patient Instructions (Addendum)
Joanna Morris , Thank you for taking time to come for your Medicare Wellness Visit. I appreciate your ongoing commitment to your health goals. Please review the following plan we discussed and let me know if I can assist you in the future.   Screening recommendations/referrals: Colon screening: completed yearly screening 12/21/2018 Mammogram: completed 01/17/2019 Bone Density: completed 01/08/2017, Please call [phone] to schedule Recommended yearly ophthalmology/optometry visit for glaucoma screening and checkup Recommended yearly dental visit for hygiene and checkup  Vaccinations: Influenza vaccine: up to date Pneumococcal vaccine: declined  Tdap vaccine: due now  Shingles vaccine: shingrix eligible     Advanced directives: Advance directive discussed with you today.Even though you declined this today please call our office should you change your mind and we can give you the proper paperwork for you to fill out.  Conditions/risks identified: If you wish to quit smoking, help is available. For free tobacco cessation program offerings call the [facility] at (504)[phone] or Live Well Line at 765-511-6548. You may also visit [email or web address] or email [email or web address] for more information on other programs.   Next appointment: Follow up in one year for your annual wellness visit    Preventive Care 65 Years and Older, Female Preventive care refers to lifestyle choices and visits with your health care provider that can promote health and wellness. What does preventive care include?  A yearly physical exam. This is also called an annual well check.  Dental exams once or twice a year.  Routine eye exams. Ask your health care provider how often you should have your eyes checked.  Personal lifestyle choices, including:  Daily care of your teeth and gums.  Regular physical activity.  Eating a healthy diet.  Avoiding tobacco and drug use.  Limiting  alcohol use.  Practicing safe sex.  Taking low-dose aspirin every day.  Taking vitamin and mineral supplements as recommended by your health care provider. What happens during an annual well check? The services and screenings done by your health care provider during your annual well check will depend on your age, overall health, lifestyle risk factors, and family history of disease. Counseling  Your health care provider may ask you questions about your:  Alcohol use.  Tobacco use.  Drug use.  Emotional well-being.  Home and relationship well-being.  Sexual activity.  Eating habits.  History of falls.  Memory and ability to understand (cognition).  Work and work Statistician.  Reproductive health. Screening  You may have the following tests or measurements:  Height, weight, and BMI.  Blood pressure.  Lipid and cholesterol levels. These may be checked every 5 years, or more frequently if you are over 92 years old.  Skin check.  Lung cancer screening. You may have this screening every year starting at age 33 if you have a 30-pack-year history of smoking and currently smoke or have quit within the past 15 years.  Fecal occult blood test (FOBT) of the stool. You may have this test every year starting at age 46.  Flexible sigmoidoscopy or colonoscopy. You may have a sigmoidoscopy every 5 years or a colonoscopy every 10 years starting at age 79.  Hepatitis C blood test.  Hepatitis B blood test.  Sexually transmitted disease (STD) testing.  Diabetes screening. This is done by checking your blood sugar (glucose) after you have not eaten for a while (fasting). You may have this done every 1-3 years.  Bone density scan. This is done to screen for osteoporosis. You  may have this done starting at age 65.  Mammogram. This may be done every 1-2 years. Talk to your health care provider about how often you should have regular mammograms. Talk with your health care provider  about your test results, treatment options, and if necessary, the need for more tests. Vaccines  Your health care provider may recommend certain vaccines, such as:  Influenza vaccine. This is recommended every year.  Tetanus, diphtheria, and acellular pertussis (Tdap, Td) vaccine. You may need a Td booster every 10 years.  Zoster vaccine. You may need this after age 58.  Pneumococcal 13-valent conjugate (PCV13) vaccine. One dose is recommended after age 59.  Pneumococcal polysaccharide (PPSV23) vaccine. One dose is recommended after age 30. Talk to your health care provider about which screenings and vaccines you need and how often you need them. This information is not intended to replace advice given to you by your health care provider. Make sure you discuss any questions you have with your health care provider. Document Released: 03/19/2015 Document Revised: 11/10/2015 Document Reviewed: 12/22/2014 Elsevier Interactive Patient Education  2017 [place] Prevention in the Home Falls can cause injuries. They can happen to people of all ages. There are many things you can do to make your home safe and to help prevent falls. What can I do on the outside of my home?  Regularly fix the edges of walkways and driveways and fix any cracks.  Remove anything that might make you trip as you walk through a door, such as a raised step or threshold.  Trim any bushes or trees on the path to your home.  Use bright outdoor lighting.  Clear any walking paths of anything that might make someone trip, such as rocks or tools.  Regularly check to see if handrails are loose or broken. Make sure that both sides of any steps have handrails.  Any raised decks and porches should have guardrails on the edges.  Have any leaves, snow, or ice cleared regularly.  Use sand or salt on walking paths during winter.  Clean up any spills in your garage right away. This includes oil or grease  spills. What can I do in the bathroom?  Use night lights.  Install grab bars by the toilet and in the tub and shower. Do not use towel bars as grab bars.  Use non-skid mats or decals in the tub or shower.  If you need to sit down in the shower, use a plastic, non-slip stool.  Keep the floor dry. Clean up any water that spills on the floor as soon as it happens.  Remove soap buildup in the tub or shower regularly.  Attach bath mats securely with double-sided non-slip rug tape.  Do not have throw rugs and other things on the floor that can make you trip. What can I do in the bedroom?  Use night lights.  Make sure that you have a light by your bed that is easy to reach.  Do not use any sheets or blankets that are too big for your bed. They should not hang down onto the floor.  Have a firm chair that has side arms. You can use this for support while you get dressed.  Do not have throw rugs and other things on the floor that can make you trip. What can I do in the kitchen?  Clean up any spills right away.  Avoid walking on wet floors.  Keep items that you use a lot  in easy-to-reach places.  If you need to reach something above you, use a strong step stool that has a grab bar.  Keep electrical cords out of the way.  Do not use floor polish or wax that makes floors slippery. If you must use wax, use non-skid floor wax.  Do not have throw rugs and other things on the floor that can make you trip. What can I do with my stairs?  Do not leave any items on the stairs.  Make sure that there are handrails on both sides of the stairs and use them. Fix handrails that are broken or loose. Make sure that handrails are as long as the stairways.  Check any carpeting to make sure that it is firmly attached to the stairs. Fix any carpet that is loose or worn.  Avoid having throw rugs at the top or bottom of the stairs. If you do have throw rugs, attach them to the floor with carpet  tape.  Make sure that you have a light switch at the top of the stairs and the bottom of the stairs. If you do not have them, ask someone to add them for you. What else can I do to help prevent falls?  Wear shoes that:  Do not have high heels.  Have rubber bottoms.  Are comfortable and fit you well.  Are closed at the toe. Do not wear sandals.  If you use a stepladder:  Make sure that it is fully opened. Do not climb a closed stepladder.  Make sure that both sides of the stepladder are locked into place.  Ask someone to hold it for you, if possible.  Clearly mark and make sure that you can see:  Any grab bars or handrails.  First and last steps.  Where the edge of each step is.  Use tools that help you move around (mobility aids) if they are needed. These include:  Canes.  Walkers.  Scooters.  Crutches.  Turn on the lights when you go into a dark area. Replace any light bulbs as soon as they burn out.  Set up your furniture so you have a clear path. Avoid moving your furniture around.  If any of your floors are uneven, fix them.  If there are any pets around you, be aware of where they are.  Review your medicines with your doctor. Some medicines can make you feel dizzy. This can increase your chance of falling. Ask your doctor what other things that you can do to help prevent falls. This information is not intended to replace advice given to you by your health care provider. Make sure you discuss any questions you have with your health care provider. Document Released: 12/17/2008 Document Revised: 07/29/2015 Document Reviewed: 03/27/2014 Elsevier Interactive Patient Education  2017 [employer].

## 2019-03-20 ENCOUNTER — Encounter: Payer: Self-pay | Admitting: Gastroenterology

## 2019-03-20 ENCOUNTER — Other Ambulatory Visit: Payer: Self-pay

## 2019-03-20 ENCOUNTER — Ambulatory Visit (INDEPENDENT_AMBULATORY_CARE_PROVIDER_SITE_OTHER): Payer: Medicare Other | Admitting: Gastroenterology

## 2019-03-20 VITALS — BP 131/77 | HR 76 | Temp 98.2°F | Ht 60.0 in | Wt 182.2 lb

## 2019-03-20 DIAGNOSIS — R748 Abnormal levels of other serum enzymes: Secondary | ICD-10-CM

## 2019-03-20 DIAGNOSIS — K760 Fatty (change of) liver, not elsewhere classified: Secondary | ICD-10-CM

## 2019-03-20 NOTE — Progress Notes (Signed)
Gastroenterology Consultation  Referring Provider:     Nobie Putnam * Primary Care Physician:  Olin Hauser, DO Primary Gastroenterologist:  Dr. Allen Norris     Reason for Consultation:     Abnormal liver enzymes        HPI:   Joanna Morris is a 69 y.o. y/o female referred for consultation & management of abnormal liver enzymes by Dr. Parks Ranger, Devonne Doughty, DO.  This patient has been sent to me for evaluation of abnormal liver enzymes.  It appears the patient's liver enzymes have been elevated for some time.  The patient's labs have shown:  Component     Latest Ref Rng & Units 12/31/2017 03/26/2018 04/02/2018 04/02/2018          10:45 AM  6:54 PM  AST     10 - 35 U/L 63 (H) 99 (H)    ALT     6 - 29 U/L 45 (H) 96 (H)     Component     Latest Ref Rng & Units 04/04/2018 04/05/2018 04/05/2018 04/05/2018          3:57 AM 12:51 PM 10:38 PM  AST     10 - 35 U/L    60 (H)  ALT     6 - 29 U/L    40   Component     Latest Ref Rng & Units 04/06/2018 04/08/2018 09/05/2018 01/13/2019             AST     10 - 35 U/L   141 (H) 163 (H)  ALT     6 - 29 U/L   104 (H) 157 (H)   She was also noted to have thrombocytopenia since 2018.  The patient had a chest CT with some involvement of the liver on the CT which was reported to have a stable nodule but did show what appeared to be fatty infiltration.  The patient has a history of stage Ib adenocarcinoma of the lung. The patient drinks a pint of vodka on the weekends. She is also taking crestor. The patient reports that she had surgery for her lung cancer and reports it was removed.  She is still smoking but has been trying to cut down by chewing tobacco.  Past Medical History:  Diagnosis Date  . Allergy   . Cataract   . Genital warts   . GERD (gastroesophageal reflux disease)    H/O  . History of kidney stones   . Hyperlipidemia   . Hypertension   . Lung nodule   . Tobacco dependency     Past Surgical History:    Procedure Laterality Date  . ABDOMINAL HYSTERECTOMY    . CESAREAN SECTION     X2  . KNEE ARTHROSCOPY WITH MEDIAL MENISECTOMY Right 05/22/2016   Procedure: KNEE ARTHROSCOPY WITH PARTIAL MEDIAL MENISECTOMY;  Surgeon: Dereck Leep, MD;  Location: [facility] ORS;  Service: Orthopedics;  Laterality: Right;  . OOPHORECTOMY    . THORACOTOMY Right 04/02/2018   Procedure: THORACOTOMY AND LUNG RESECTION WITH PREOP BRONCH;  Surgeon: Nestor Lewandowsky, MD;  Location: [facility] ORS;  Service: General;  Laterality: Right;    Prior to Admission medications   Medication Sig Start Date End Date Taking? Authorizing Provider  aspirin EC 81 MG tablet Take 1 tablet (81 mg total) by mouth daily. 12/27/16   Karamalegos, Devonne Doughty, DO  Calcium Carbonate-Vitamin D (CALCIUM 600/VITAMIN D PO) Take 1 tablet by mouth daily.    [provider]  Cholecalciferol (VITAMIN D3) 1000 units CAPS Take 1,000 Units by mouth daily.    [provider]  hydrochlorothiazide (MICROZIDE) 12.5 MG capsule Take 1 capsule (12.5 mg total) by mouth daily. 11/29/18   Karamalegos, Devonne Doughty, DO  metoprolol succinate (TOPROL-XL) 25 MG 24 hr tablet Take 1 tablet (25 mg total) by mouth daily. Patient not taking: Reported on 03/18/2019 04/11/18   Tylene Fantasia, PA-C  potassium chloride SA (K-DUR,KLOR-CON) 20 MEQ tablet Take 1 tablet (20 mEq total) by mouth 2 (two) times daily. 03/26/18   Nestor Lewandowsky, MD  rosuvastatin (CRESTOR) 5 MG tablet TAKE 1 TABLET BY MOUTH AT BEDTIME 03/11/19   Olin Hauser, DO    Family History  Problem Relation Age of Onset  . Heart disease Mother 45       MI  . Heart attack Mother 19  . Diabetes Mother   . Pancreatic cancer Sister   . Ovarian cancer Sister   . Breast cancer Neg Hx      Social History   Tobacco Use  . Smoking status: Current Every Day Smoker    Packs/day: 0.50    Years: 51.00    Pack years: 25.50    Types: Cigarettes  . Smokeless tobacco: Never Used  . Tobacco comment:  Quit once for 2 years. pt using nicorette gum   Substance Use Topics  . Alcohol use: Yes    Comment: half a pint of liquor every week  . Drug use: No    Allergies as of 03/20/2019 - Review Complete 03/18/2019  Allergen Reaction Noted  . Tramadol Rash 12/10/2015    Review of Systems:    All systems reviewed and negative except where noted in HPI.   Physical Exam:  There were no vitals taken for this visit. No LMP recorded. Patient has had a hysterectomy. General:   Alert,  Well-developed, well-nourished, pleasant and cooperative in NAD Head:  Normocephalic and atraumatic. Eyes:  Sclera clear, no icterus.   Conjunctiva pink. Ears:  Normal auditory acuity. Neck:  Supple; no masses or thyromegaly. Lungs:  Respirations even and unlabored.  Clear throughout to auscultation.   No wheezes, crackles, or rhonchi. No acute distress. Heart:  Regular rate and rhythm; no murmurs, clicks, rubs, or gallops. Abdomen:  Normal bowel sounds.  No bruits.  Soft, non-tender and non-distended without masses, hepatosplenomegaly or hernias noted.  No guarding or rebound tenderness.  Negative Carnett sign.   Rectal:  Deferred.  Pulses:  Normal pulses noted. Extremities:  No clubbing or edema.  No cyanosis. Neurologic:  Alert and oriented x3;  grossly normal neurologically. Skin:  Intact without significant lesions or rashes.  No jaundice. Lymph Nodes:  No significant cervical adenopathy. Psych:  Alert and cooperative. Normal mood and affect.  Imaging Studies: No results found.  Assessment and Plan:   Joanna Morris is a 69 y.o. y/o female who comes in today with abnormal liver enzymes.  The patient drinks a pint of vodka a weekend and has been doing for some time.  The patient also has had increasing LFTs with the AST being higher than the ALT.  She will have lab sent off for other possible cause of abnormal liver enzymes.  The CT scan of the chest did show part of the liver which were consistent with  fatty liver.  The patient has been told the fatty liver can be caused by her drinking as well as high cholesterol diabetes and being overweight.  The  patient has been told to try and stop alcohol use completely for 1 month and we will recheck her liver enzymes in 1 month.  She will also have her blood sent off today to check for her immunity to hepatitis A and B to see if she needs vaccinations in addition to the blood work looking for other possible cause of abnormal liver enzymes.  The patient has also been told that her Crestor can cause abnormal liver enzymes.  The patient has been explained the plan and agrees with it.  The patient's previous notes and imaging have been reviewed with the patient.  Lucilla Lame, MD. Mar[facility] Regal    Note: This dictation was prepared with Dragon dictation along with smaller phrase technology. Any transcriptional errors that result from this process are unintentional.

## 2019-03-21 ENCOUNTER — Other Ambulatory Visit
Admission: RE | Admit: 2019-03-21 | Discharge: 2019-03-21 | Disposition: A | Discharge status: Home / Self Care | Payer: Medicare Other | Source: Ambulatory Visit | Attending: Gastroenterology | Admitting: Gastroenterology

## 2019-03-21 DIAGNOSIS — R748 Abnormal levels of other serum enzymes: Secondary | ICD-10-CM | POA: Diagnosis not present

## 2019-03-21 LAB — HEPATIC FUNCTION PANEL
ALT: 90 U/L — ABNORMAL HIGH (ref 0–44)
AST: 127 U/L — ABNORMAL HIGH (ref 15–41)
Albumin: 3.2 g/dL — ABNORMAL LOW (ref 3.5–5.0)
Alkaline Phosphatase: 64 U/L (ref 38–126)
Bilirubin, Direct: 0.1 mg/dL (ref 0.0–0.2)
Indirect Bilirubin: 0.6 mg/dL (ref 0.3–0.9)
Total Bilirubin: 0.7 mg/dL (ref 0.3–1.2)
Total Protein: 7.5 g/dL (ref 6.5–8.1)

## 2019-03-21 LAB — IRON AND TIBC
Iron: 131 ug/dL (ref 28–170)
Saturation Ratios: 43 % — ABNORMAL HIGH (ref 10.4–31.8)
TIBC: 305 ug/dL (ref 250–450)
UIBC: 174 ug/dL

## 2019-03-21 LAB — HEPATITIS A ANTIBODY, TOTAL: hep A Total Ab: REACTIVE — AB

## 2019-03-21 LAB — HEPATITIS B SURFACE ANTIGEN: Hepatitis B Surface Ag: NONREACTIVE

## 2019-03-21 LAB — FERRITIN: Ferritin: 977 ng/mL — ABNORMAL HIGH (ref 11–307)

## 2019-03-21 LAB — HEPATITIS B SURFACE ANTIBODY,QUALITATIVE: Hep B S Ab: NONREACTIVE

## 2019-03-21 LAB — HEPATITIS C ANTIBODY: HCV Ab: REACTIVE — AB

## 2019-03-22 LAB — CERULOPLASMIN: Ceruloplasmin: 26.4 mg/dL (ref 19.0–39.0)

## 2019-03-22 LAB — MITOCHONDRIAL ANTIBODIES: Mitochondrial M2 Ab, IgG: <20 Units (ref 0.0–20.0)

## 2019-03-22 LAB — ALPHA-1-ANTITRYPSIN: A-1 Antitrypsin, Ser: 147 mg/dL (ref 101–187)

## 2019-03-22 LAB — ANA: Anti Nuclear Antibody (ANA): NEGATIVE

## 2019-03-23 LAB — ANTI-SMOOTH MUSCLE ANTIBODY, IGG: F-Actin IgG: 14 Units (ref 0–19)

## 2019-03-25 ENCOUNTER — Telehealth: Payer: Self-pay

## 2019-03-25 NOTE — Telephone Encounter (Signed)
-----   Message from Lucilla Lame, MD sent at 03/24/2019  9:17 AM EST ----- Let the patient know she will need a vaccination for Hep B and her Hep C came back positive. We need to check the viral load and genotype.

## 2019-03-25 NOTE — Telephone Encounter (Signed)
Unable to leave a voicemail regarding recent lab results. Pt does not have voicemail set up.

## 2019-03-31 NOTE — Telephone Encounter (Signed)
Tried contacting pt again today. Voicemail was not set up. Couldn't leave a message.

## 2019-04-08 ENCOUNTER — Other Ambulatory Visit: Payer: Self-pay

## 2019-04-08 NOTE — Telephone Encounter (Signed)
Unable to reach pt. Result letter mailed.

## 2019-04-15 ENCOUNTER — Telehealth: Payer: Self-pay | Admitting: Gastroenterology

## 2019-04-15 ENCOUNTER — Other Ambulatory Visit: Payer: Self-pay

## 2019-04-15 DIAGNOSIS — B182 Chronic viral hepatitis C: Secondary | ICD-10-CM

## 2019-04-15 NOTE — Telephone Encounter (Signed)
Returned pt's call and advised her of recent lab results. Additional labs order.

## 2019-04-15 NOTE — Telephone Encounter (Signed)
Pt left vm to check on her lab work

## 2019-04-25 LAB — HCV FIBROSURE
ALPHA 2-MACROGLOBULINS, QN: 341 mg/dL — ABNORMAL HIGH (ref 110–276)
ALT (SGPT) P5P: 164 IU/L — ABNORMAL HIGH (ref 0–40)
Apolipoprotein A-1: 194 mg/dL (ref 116–209)
Bilirubin, Total: 0.3 mg/dL (ref 0.0–1.2)
Fibrosis Score: 0.45 — ABNORMAL HIGH (ref 0.00–0.21)
GGT: 225 IU/L — ABNORMAL HIGH (ref 0–60)
Haptoglobin: 186 mg/dL (ref 37–355)
Necroinflammat Activity Score: 0.79 — ABNORMAL HIGH (ref 0.00–0.17)

## 2019-04-25 LAB — HEPATITIS C GENOTYPE

## 2019-04-25 LAB — HCV RNA (INTERNATIONAL UNITS)
HCV RNA (International Units): 10400000 IU/mL
HCV log10: 7.017 log10 IU/mL

## 2019-04-25 LAB — HCV RNA QUANT

## 2019-04-29 ENCOUNTER — Telehealth: Payer: Self-pay

## 2019-04-29 NOTE — Telephone Encounter (Signed)
-----   Message from Lucilla Lame, MD sent at 04/24/2019  5:55 PM EST ----- Let the patient know there is mild to moderate fibrosis with significant inflammation therefore she should be treated for her hepatitis C.

## 2019-04-29 NOTE — Telephone Encounter (Signed)
-----   Message from Lucilla Lame, MD sent at 04/23/2019 10:03 PM EST ----- This patient will need to be started on treatment for her hepatitis C when all of her labs are back.

## 2019-04-29 NOTE — Telephone Encounter (Signed)
Pt notified of results. Paperwork completed and faxed to [facility].

## 2019-05-01 ENCOUNTER — Ambulatory Visit: Payer: Medicare Other | Attending: Internal Medicine

## 2019-05-01 DIAGNOSIS — Z23 Encounter for immunization: Secondary | ICD-10-CM | POA: Insufficient documentation

## 2019-05-01 NOTE — Progress Notes (Signed)
   Covid-19 Vaccination Clinic  Name:  YURANI FETTES    MRN: [id number] DOB: 10-15-1950  05/01/2019  Ms. Cadieux was observed post Covid-19 immunization for 15 minutes without incidence. She was provided with Vaccine Information Sheet and instruction to access the V-Safe system.   Ms. Kirchner was instructed to call 911 with any severe reactions post vaccine: Marland Kitchen Difficulty breathing  . Swelling of your face and throat  . A fast heartbeat  . A bad rash all over your body  . Dizziness and weakness    Immunizations Administered    Name Date Dose VIS Date Route   Pfizer COVID-19 Vaccine 05/01/2019  9:21 AM 0.3 mL 02/14/2019 Intramuscular   Manufacturer: [place]   Lot: [id number]   [place]: [id number]

## 2019-05-27 ENCOUNTER — Ambulatory Visit: Payer: Medicare Other | Attending: Internal Medicine

## 2019-05-27 DIAGNOSIS — Z23 Encounter for immunization: Secondary | ICD-10-CM

## 2019-05-27 NOTE — Progress Notes (Signed)
   Covid-19 Vaccination Clinic  Name:  TAMEKA HOILAND    MRN: [id number] DOB: October 22, 1950  05/27/2019  Ms. Dutson was observed post Covid-19 immunization for 15 minutes without incident. She was provided with Vaccine Information Sheet and instruction to access the V-Safe system.   Ms. Colan was instructed to call 911 with any severe reactions post vaccine: Marland Kitchen Difficulty breathing  . Swelling of face and throat  . A fast heartbeat  . A bad rash all over body  . Dizziness and weakness   Immunizations Administered    Name Date Dose VIS Date Route   Pfizer COVID-19 Vaccine 05/27/2019  8:54 AM 0.3 mL 02/14/2019 Intramuscular   Manufacturer: [place]   Lot: [id number]   [place]: [address]-5

## 2019-05-30 ENCOUNTER — Other Ambulatory Visit: Payer: Self-pay | Admitting: Family Medicine

## 2019-05-30 DIAGNOSIS — I10 Essential (primary) hypertension: Secondary | ICD-10-CM

## 2019-06-23 ENCOUNTER — Encounter: Payer: Self-pay | Admitting: Family Medicine

## 2019-06-23 ENCOUNTER — Other Ambulatory Visit: Payer: Self-pay | Admitting: Family Medicine

## 2019-06-23 ENCOUNTER — Other Ambulatory Visit: Payer: Self-pay

## 2019-06-23 ENCOUNTER — Ambulatory Visit (INDEPENDENT_AMBULATORY_CARE_PROVIDER_SITE_OTHER): Payer: Medicare Other | Admitting: Family Medicine

## 2019-06-23 VITALS — BP 138/80 | HR 70 | Temp 97.1°F | Resp 16 | Ht 60.0 in | Wt 188.0 lb

## 2019-06-23 DIAGNOSIS — I10 Essential (primary) hypertension: Secondary | ICD-10-CM

## 2019-06-23 DIAGNOSIS — B182 Chronic viral hepatitis C: Secondary | ICD-10-CM

## 2019-06-23 DIAGNOSIS — R7989 Other specified abnormal findings of blood chemistry: Secondary | ICD-10-CM | POA: Diagnosis not present

## 2019-06-23 DIAGNOSIS — C3491 Malignant neoplasm of unspecified part of right bronchus or lung: Secondary | ICD-10-CM

## 2019-06-23 DIAGNOSIS — R7309 Other abnormal glucose: Secondary | ICD-10-CM | POA: Diagnosis not present

## 2019-06-23 DIAGNOSIS — E7800 Pure hypercholesterolemia, unspecified: Secondary | ICD-10-CM

## 2019-06-23 DIAGNOSIS — Z0000 Encounter for general adult medical examination without abnormal findings: Secondary | ICD-10-CM

## 2019-06-23 DIAGNOSIS — E669 Obesity, unspecified: Secondary | ICD-10-CM

## 2019-06-23 NOTE — Progress Notes (Signed)
Subjective:    Patient ID: Joanna Morris, female    DOB: [birth year], 69 y.o.   MRN: [id number]  Joanna Morris is a 69 y.o. female presenting on 06/23/2019 for Hypertension   HPI   Follow-up RUL Primary Adenocarcinoma Followed by [facility] Oncology  s/p RUL lobectomy, with curative intent. She had negative lymph nodes for malignancy, opted for surveillance and monitoring without adjuvant chemotherapy at that time in 04/2018. Next Chest CT 08/07/19 and then follow up with Oncology 08/08/19, then will go every 1 year instead of 6 month  Chronic Hepatitis C with Fibrosis / Elevated LFTs - Last visit with me 01/2019, for annual and same problem with elevated LFTs, she was referred to [facility] for further work up and management, and they identified chronic Hep C, she had other negative work up, she had prior normal LFTs back in 2017 earlier in our care here at our office, and also had on file a historical negative Hep C antibody from 2012, see prior notes for background information. - Interval update with now managed by [facility] and given rx for Hep C treatment, has not started anti viral yet - Also regarding LFT, she still takes low dose Rosuvastatin 5mg   CHRONIC HTN: Not checking BP regularly. Recent readings appropriate. Current Meds - HCTZ 12.5mg daily Did NOT take meds today. Tolerating well, w/o complaints. Denies CP, dyspnea, HA, edema, dizziness / lightheadedness  ELEVATED A1c Last lab 5.6 (01/2019) Has family history mother DM Lifestyle: - Diet (Still improving diet -still reducingsweets and starch, now only limits bread, tried to inc water) - Exercise (Stillwalkingregularly)  PMHChronic R knee pain, s/p R arthroscopic partial medial meniscectomy  Health Maintenance:  UTD COVID19 vaccine, 2nd dose this week  Breast CA Screening:UTD Mammo 01/2019 Osteopenia - DEXA 2018. Now due for repeat.  FIT - 12/2018 negative.   Depression screen [facility] 2/9 06/23/2019 03/18/2019  01/17/2019  Decreased Interest 0 0 0  Down, Depressed, Hopeless 0 0 0  PHQ - 2 Score 0 0 0    Social History   Tobacco Use  . Smoking status: Current Every Day Smoker    Packs/day: 0.50    Years: 51.00    Pack years: 25.50    Types: Cigarettes  . Smokeless tobacco: Never Used  . Tobacco comment: Quit once for 2 years. pt using nicorette gum   Substance Use Topics  . Alcohol use: Yes    Comment: half a pint of liquor every week  . Drug use: No    Review of Systems Per HPI unless specifically indicated above     Objective:    BP 138/80 (BP Location: Left Arm, Cuff Size: Normal)   Pulse 70   Temp (!) 97.1 F (36.2 C) (Temporal)   Resp 16   Ht 5' (1.524 m)   Wt 188 lb (85.3 kg)   BMI 36.72 kg/m   Wt Readings from Last 3 Encounters:  06/23/19 188 lb (85.3 kg)  03/20/19 182 lb 3.2 oz (82.6 kg)  03/18/19 181 lb (82.1 kg)    Physical Exam Vitals and nursing note reviewed.  Constitutional:      General: She is not in acute distress.    Appearance: She is well-developed. She is not diaphoretic.     Comments: Well-appearing, comfortable, cooperative  HENT:     Head: Normocephalic and atraumatic.  Eyes:     General:        Right eye: No discharge.  Left eye: No discharge.     Conjunctiva/sclera: Conjunctivae normal.  Cardiovascular:     Rate and Rhythm: Normal rate.  Pulmonary:     Effort: Pulmonary effort is normal.  Skin:    General: Skin is warm and dry.     Findings: No erythema or rash.  Neurological:     Mental Status: She is alert and oriented to person, place, and time.  Psychiatric:        Behavior: Behavior normal.     Comments: Well groomed, good eye contact, normal speech and thoughts    Results for orders placed or performed in visit on 04/15/19  Hepatitis C genotype  Result Value Ref Range   Hepatitis C Genotype 1b    Please Note (HCV): Comment   HCV FibroSURE  Result Value Ref Range   Fibrosis Score 0.45 (H) 0.00 - 0.21   Fibrosis  Stage F1-F2    Necroinflammat Activity Score 0.79 (H) 0.00 - 0.17   Necroinflammat Activity Grade A3-Severe activity    ALPHA 2-MACROGLOBULINS, QN 341 (H) 110 - 276 mg/dL   Haptoglobin 186 37 - 355 mg/dL   Apolipoprotein A-1 194 116 - 209 mg/dL   Bilirubin, Total 0.3 0.0 - 1.2 mg/dL   GGT 225 (H) 0 - 60 IU/L   ALT (SGPT) P5P 164 (H) 0 - 40 IU/L   Interpretations: Comment    Fibrosis Scoring: Comment    Necroinflamm Activity Scoring: Comment    Limitations: Comment    Comment Comment   HCV RNA quant  Result Value Ref Range   Hepatitis C Quantitation See Final Results IU/mL   Test Information Comment   HCV RNA (International Units)  Result Value Ref Range   HCV RNA (International Units) 10,400,000 IU/mL   HCV log10 7.017 log10 IU/mL      Assessment & Plan:   Problem List Items Addressed This Visit    Primary adenocarcinoma of right lung [facility])    S/p lobectomy primary R adenocarcinoma On surveillance per Oncology [facility] CC CT imaging 08/2019, then q 1 year      Relevant Medications   MAVYRET 100-40 MG TABS   Essential hypertension - Primary    Controlled HTN, except mild elevated today, did not take med, repeat improved - Home BP readings reported normal No known complications  Plan:  1. Continue current BP regimen - HCTZ 12.5mg  daily  2. Encourage improved lifestyle - low sodium diet, regular exercise - need to return to ortho for knee 3. Continue monitor BP outside office, bring readings to next visit, if persistently >140/90 or new symptoms notify office sooner      Elevated LFTs   Elevated hemoglobin A1c    Check A1c lab with next physical in 6 months      Chronic hepatitis C without hepatic coma ([place])    Diagnosed in 03/2019, previously since 2017 had normal or mild elevated LFTs only but no significant issue identified until 2020 with elevated LFTs, now followed by [facility], has rx for hep c anti viral now      Relevant Medications   MAVYRET 100-40 MG TABS      No  orders of the defined types were placed in this encounter.    Follow up plan: Return in about 6 months (around 12/23/2019) for Annual Physical.  Future labs ordered for 12/2019  Joanna Morris, [facility] 06/23/2019, 8:53 AM

## 2019-06-23 NOTE — Patient Instructions (Addendum)
Thank you for coming to the office today.  Follow-up as planned, get COVID vaccine, then can start Hep C treatment, follow-up with them.  Follow as scheduled in June 2021 for CT Scan with cancer doctors.  Keep watch on BP if not improving.  DUE for FASTING BLOOD WORK (no food or drink after midnight before the lab appointment, only water or coffee without cream/sugar on the morning of)  SCHEDULE "Lab Only" visit in the morning at the clinic for lab draw in 6 MONTHS   - Make sure Lab Only appointment is at about 1 week before your next appointment, so that results will be available  For Lab Results, once available within 2-3 days of blood draw, you can can log in to MyChart online to view your results and a brief explanation. Also, we can discuss results at next follow-up visit.   Please schedule a Follow-up Appointment to: Return in about 6 months (around 12/23/2019) for Annual Physical.  If you have any other questions or concerns, please feel free to call the office or send a message through [facility]. You may also schedule an earlier appointment if necessary.  Additionally, you may be receiving a survey about your experience at our office within a few days to 1 week by e-mail or mail. We value your feedback.  Nobie Putnam, DO [place]

## 2019-06-23 NOTE — Assessment & Plan Note (Signed)
Controlled HTN, except mild elevated today, did not take med, repeat improved - Home BP readings reported normal No known complications  Plan:  1. Continue current BP regimen - HCTZ 12.5mg  daily  2. Encourage improved lifestyle - low sodium diet, regular exercise - need to return to ortho for knee 3. Continue monitor BP outside office, bring readings to next visit, if persistently >140/90 or new symptoms notify office sooner

## 2019-06-23 NOTE — Assessment & Plan Note (Signed)
Check A1c lab with next physical in 6 months

## 2019-06-23 NOTE — Assessment & Plan Note (Addendum)
Diagnosed in 03/2019, previously since 2017 had normal or mild elevated LFTs only but no significant issue identified until 2020 with elevated LFTs, now followed by [facility], has rx for hep c anti viral now

## 2019-06-23 NOTE — Assessment & Plan Note (Signed)
S/p lobectomy primary R adenocarcinoma On surveillance per Oncology [facility] CC CT imaging 08/2019, then q 1 year

## 2019-08-07 ENCOUNTER — Other Ambulatory Visit: Payer: Self-pay

## 2019-08-07 ENCOUNTER — Ambulatory Visit
Admission: RE | Admit: 2019-08-07 | Discharge: 2019-08-07 | Disposition: A | Discharge status: Home / Self Care | Payer: Medicare Other | Source: Ambulatory Visit | Attending: Oncology | Admitting: Oncology

## 2019-08-07 DIAGNOSIS — Z08 Encounter for follow-up examination after completed treatment for malignant neoplasm: Secondary | ICD-10-CM | POA: Insufficient documentation

## 2019-08-07 DIAGNOSIS — Z85118 Personal history of other malignant neoplasm of bronchus and lung: Secondary | ICD-10-CM

## 2019-08-07 DIAGNOSIS — C3411 Malignant neoplasm of upper lobe, right bronchus or lung: Secondary | ICD-10-CM | POA: Diagnosis not present

## 2019-08-07 HISTORY — DX: Malignant neoplasm of unspecified part of right bronchus or lung: C34.91

## 2019-08-07 LAB — POCT I-STAT CREATININE: Creatinine, Ser: 0.8 mg/dL (ref 0.44–1.00)

## 2019-08-07 MED ORDER — IOHEXOL 300 MG/ML  SOLN
75.0000 mL | Freq: Once | INTRAMUSCULAR | Status: AC | PRN
  Administered 2019-08-07: 75 mL via INTRAVENOUS

## 2019-08-08 ENCOUNTER — Inpatient Hospital Stay: Payer: Medicare Other | Attending: Oncology | Admitting: Oncology

## 2019-08-08 VITALS — BP 157/87 | HR 64 | Temp 95.8°F | Resp 16 | Wt 187.8 lb

## 2019-08-08 DIAGNOSIS — F17210 Nicotine dependence, cigarettes, uncomplicated: Secondary | ICD-10-CM | POA: Insufficient documentation

## 2019-08-08 DIAGNOSIS — D696 Thrombocytopenia, unspecified: Secondary | ICD-10-CM | POA: Diagnosis not present

## 2019-08-08 DIAGNOSIS — C3411 Malignant neoplasm of upper lobe, right bronchus or lung: Secondary | ICD-10-CM | POA: Insufficient documentation

## 2019-08-08 DIAGNOSIS — Z08 Encounter for follow-up examination after completed treatment for malignant neoplasm: Secondary | ICD-10-CM

## 2019-08-08 DIAGNOSIS — I10 Essential (primary) hypertension: Secondary | ICD-10-CM | POA: Diagnosis not present

## 2019-08-08 DIAGNOSIS — Z85118 Personal history of other malignant neoplasm of bronchus and lung: Secondary | ICD-10-CM | POA: Diagnosis not present

## 2019-08-08 NOTE — Progress Notes (Signed)
Patient here for oncology follow-up appointment, expresses no complaints or concerns at this time.    

## 2019-08-11 ENCOUNTER — Encounter: Payer: Self-pay | Admitting: Oncology

## 2019-08-11 NOTE — Progress Notes (Signed)
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 She has a longstanding history of smoking and recently underwent CT.Lung cancer screening which showed a 22.4 mm irregular nodule in the right upper lobe. This was followed by a PET CT scan which showed a spiculated 2.7 cm right upper lobe lung mass with an SUV of 26.8 consistent with primary lung cancer. There was no evidence of hypermetabolic mediastinal or hilar adenopathy. No evidence of metastatic disease elsewhere. Patient referred to [place] for further recommendations. Overall she is doing well. She lives alone and is independent of her ADLs and IADLs. She is also had her PFTs done which have been normal. Reports that her appetite is good and she denies any unintentional weight loss  Patient was seen by Dr. Genevive Bi and underwent right upper lobectomy on 04/02/2018.Final pathology showed 2.5 cm adenocarcinoma, micropapillary predominant with visceral pleural invasion. 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 Benign 0.2 mm nodule of ossified cartilage. 1 lobar and 8 other lymph nodes were examined and were negative for malignancy. Lymphovascular invasion not identified. Margins uninvolved. pT2ApN0.  After risks and benefits discussion patient chose not to proceed with adjuvant chemotherapy  Interval  history-patient is doing well and currently denies any complaints.  Appetite and weight has remained stable.  Denies any cough or shortness of breath.  She has occasional postthoracotomy pain which is essentially stable.  ECOG PS- 1 Pain scale- 0   Review of systems- Review of Systems  Constitutional: Positive for malaise/fatigue. Negative for chills, fever and weight loss.  HENT: Negative for congestion, ear discharge and nosebleeds.   Eyes: Negative for blurred vision.  Respiratory: Negative for cough, hemoptysis, sputum production, shortness of breath and wheezing.   Cardiovascular: Negative for chest pain, palpitations, orthopnea and claudication.  Gastrointestinal: Negative for abdominal pain, blood in stool, constipation, diarrhea, heartburn, melena, nausea and vomiting.  Genitourinary: Negative for dysuria, flank pain, frequency, hematuria and urgency.  Musculoskeletal: Negative for back pain, joint pain and myalgias.  Skin: Negative for rash.  Neurological: Negative for dizziness, tingling, focal weakness, seizures, weakness and headaches.  Endo/Heme/Allergies: Does not bruise/bleed easily.  Psychiatric/Behavioral: Negative for depression and suicidal ideas. The patient does not have insomnia.       Allergies  Allergen Reactions  . Tramadol Rash     Past Medical History:  Diagnosis Date  . Allergy   . Cataract   . Genital warts   . GERD (gastroesophageal reflux disease)    H/O  . History of kidney stones   . Hyperlipidemia   . Hypertension   . Lung nodule   . Primary cancer of right lung ([place])   . Tobacco dependency      Past Surgical History:  Procedure Laterality Date  . ABDOMINAL HYSTERECTOMY    . CESAREAN SECTION     X2  . KNEE ARTHROSCOPY WITH MEDIAL MENISECTOMY Right 05/22/2016   Procedure: KNEE ARTHROSCOPY WITH PARTIAL MEDIAL MENISECTOMY;  Surgeon:  Dereck Leep, MD;  Location: [facility] ORS;  Service: Orthopedics;  Laterality: Right;  . OOPHORECTOMY    .  THORACOTOMY Right 04/02/2018   Procedure: THORACOTOMY AND LUNG RESECTION WITH PREOP BRONCH;  Surgeon: Nestor Lewandowsky, MD;  Location: [facility] ORS;  Service: General;  Laterality: Right;    Social History   Socioeconomic History  . Marital status: Divorced    Spouse name: Not on file  . Number of children: 2  . Years of education: [employer]  . Highest education level: Not on file  Occupational History  . 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 Smoking status: Current Every Day Smoker    Packs/day: 0.50    Years: 51.00    Pack years: 25.50    Types: Cigarettes  . Smokeless tobacco: Never Used  . Tobacco comment: Quit once for 2 years. pt using nicorette gum   Substance and Sexual Activity  . Alcohol use: Yes    Comment: half a pint of liquor every week  . Drug use: No  . Sexual activity: Not Currently    Birth control/protection: Surgical  Other Topics Concern  . Not on file  Social History Narrative   Lives at home by herself, independent at baseline   Social Determinants of Health   Financial Resource Strain:   . Difficulty of Paying Living Expenses:   Food Insecurity:   . Worried About Charity fundraiser in the Last Year:   . Arboriculturist in the Last Year:   Transportation Needs:   . Film/video editor (Medical):   Marland Kitchen Lack of Transportation (Non-Medical):   Physical Activity:   . Days of Exercise per Week:   . Minutes of Exercise per Session:   Stress:   . Feeling of Stress :   Social Connections:   . Frequency of Communication with Friends and Family:   . Frequency of Social Gatherings with Friends and Family:   . Attends Religious Services:   . Active Member of Clubs or Organizations:   . Attends Archivist Meetings:   Marland Kitchen Marital Status:   Intimate Partner Violence:   . Fear of Current or Ex-Partner:   . Emotionally Abused:   Marland Kitchen Physically Abused:   . Sexually Abused:     Family History  Problem  Relation Age of Onset  . Heart disease Mother 31       MI  . Heart attack Mother 62  . Diabetes Mother   . Pancreatic cancer Sister   . Ovarian cancer Sister   . Breast cancer Neg Hx      Current Outpatient Medications:  .  aspirin EC 81 MG tablet, Take 1 tablet (81 mg total) by mouth daily., Disp: , Rfl:  .  Calcium Carbonate-Vitamin D (CALCIUM 600/VITAMIN D PO), Take 1 tablet by mouth daily., Disp: , Rfl:  .  Cholecalciferol (VITAMIN D3) 1000 units CAPS, Take 1,000 Units by mouth daily., Disp: , Rfl:  .  hydrochlorothiazide (MICROZIDE) 12.5 MG capsule, TAKE 1 CAPSULE(12.5 MG) BY MOUTH DAILY, Disp: 90 capsule, Rfl: 1 .  MAVYRET 100-40 MG TABS, Take 3 tablets by mouth daily., Disp: , Rfl:  .  potassium chloride SA (K-DUR,KLOR-CON) 20 MEQ tablet, Take 1 tablet (20 mEq total) by mouth 2 (two) times daily., Disp: 10 tablet, Rfl: 0 .  rosuvastatin (CRESTOR) 5 MG tablet, TAKE 1 TABLET BY MOUTH AT BEDTIME, Disp: 90  tablet, Rfl: 1  Physical exam:  Vitals:   08/08/19 1138  BP: (!) 157/87  Pulse: 64  Resp: 16  Temp: (!) 95.8 F (35.4 C)  TempSrc: Tympanic  SpO2: 99%  Weight: 187 lb 12.8 oz (85.2 kg)   Physical Exam Constitutional:      General: She is not in acute distress. Cardiovascular:     Rate and Rhythm: Normal rate and regular rhythm.     Heart sounds: Normal heart sounds.  Pulmonary:     Effort: Pulmonary effort is normal.     Breath sounds: Normal breath sounds.  Abdominal:     General: Bowel sounds are normal.     Palpations: Abdomen is soft.  Skin:    General: Skin is warm and dry.  Neurological:     Mental Status: She is alert and oriented to person, place, and time.      CMP Latest Ref Rng & Units 08/07/2019  Glucose 65 - 139 mg/dL -  BUN 7 - 25 mg/dL -  Creatinine 0.44 - 1.00 mg/dL 0.80  Sodium 135 - 146 mmol/L -  Potassium 3.5 - 5.3 mmol/L -  Chloride 98 - 110 mmol/L -  CO2 20 - 32 mmol/L -  Calcium 8.6 - 10.4 mg/dL -  Total Protein 6.5 - 8.1 g/dL -    Total Bilirubin 0.3 - 1.2 mg/dL -  Alkaline Phos 38 - 126 U/L -  AST 15 - 41 U/L -  ALT 0 - 44 U/L -   CBC Latest Ref Rng & Units 01/13/2019  WBC 3.8 - 10.8 Thousand/uL 4.7  Hemoglobin 11.7 - 15.5 g/dL 14.4  Hematocrit 35.0 - 45.0 % 43.4  Platelets 140 - 400 Thousand/uL 109(L)    No images are attached to the encounter.  CT Chest W Contrast  Result Date: 08/07/2019 CLINICAL DATA:  Stage I B right upper lobe lung adenocarcinoma, status post right upper lobectomy EXAM: CT CHEST WITH CONTRAST TECHNIQUE: Multidetector CT imaging of the chest was performed during intravenous contrast administration. CONTRAST:  49mL OMNIPAQUE IOHEXOL 300 MG/ML  SOLN COMPARISON:  02/03/2019 FINDINGS: Cardiovascular: The heart is normal in size. No pericardial effusion. No evidence of thoracic aortic aneurysm. Mild atherosclerotic calcifications of the aortic arch. Mediastinum/Nodes: No suspicious mediastinal lymphadenopathy. Subcentimeter right thyroid nodule (series 2/image 3). Not clinically significant; no follow-up imaging recommended (ref: J Am Coll Radiol. 2015 Feb;12(2): 143-50). Lungs/Pleura: Status post right upper lobectomy. No suspicious pulmonary nodules. No focal consolidation. No pleural effusion or pneumothorax. Upper Abdomen: Visualized upper abdomen is notable for benign hepatic cysts. Musculoskeletal: Degenerative changes of the visualized thoracolumbar spine. IMPRESSION: Status post right upper lobectomy. No evidence of recurrent or metastatic disease. Aortic Atherosclerosis (ICD10-I70.0). Electronically Signed   By: Julian Hy M.D.   On: 08/07/2019 16:56     Assessment and plan- Patient is a 69 y.o. 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   She is here for routine follow-up to discuss CT scan results  I have reviewed CT chest images independently and discussed findings with the patient.  Overall She is doing well s/p  lumpectomy and there is no concerning findings of recurrence based on recent imaging.  She will continue to get CT scans every 6 months for up to 5 years.  I will see her back in 6 months with CBC with differential, CMP.  Patient has mild baseline thrombocytopenia which I will continue to monitor on a  yearly basis   Visit Diagnosis 1. Thrombocytopenia ([place])   2. Encounter for follow-up surveillance of lung cancer      Dr. Randa Evens, MD, MPH [facility] at [facility] [phone] 08/11/2019 2:58 PM

## 2019-12-11 ENCOUNTER — Other Ambulatory Visit: Payer: Self-pay | Admitting: Family Medicine

## 2019-12-11 DIAGNOSIS — Z1231 Encounter for screening mammogram for malignant neoplasm of breast: Secondary | ICD-10-CM

## 2020-01-19 ENCOUNTER — Ambulatory Visit
Admission: RE | Admit: 2020-01-19 | Discharge: 2020-01-19 | Disposition: A | Discharge status: Home / Self Care | Payer: Medicare Other | Source: Ambulatory Visit | Attending: Family Medicine | Admitting: Family Medicine

## 2020-01-19 ENCOUNTER — Other Ambulatory Visit: Payer: Self-pay

## 2020-01-19 DIAGNOSIS — Z1231 Encounter for screening mammogram for malignant neoplasm of breast: Secondary | ICD-10-CM

## 2020-01-21 IMAGING — MG DIGITAL SCREENING BILATERAL MAMMOGRAM WITH TOMO AND CAD
6 of 10 series · 6 of 30 positions shown · non-contrast
Comparison: Previous exam(s).

CLINICAL DATA: Screening.

EXAM:
DIGITAL SCREENING BILATERAL MAMMOGRAM WITH TOMO AND CAD

[R MLO synth-2D (1 of 2)]
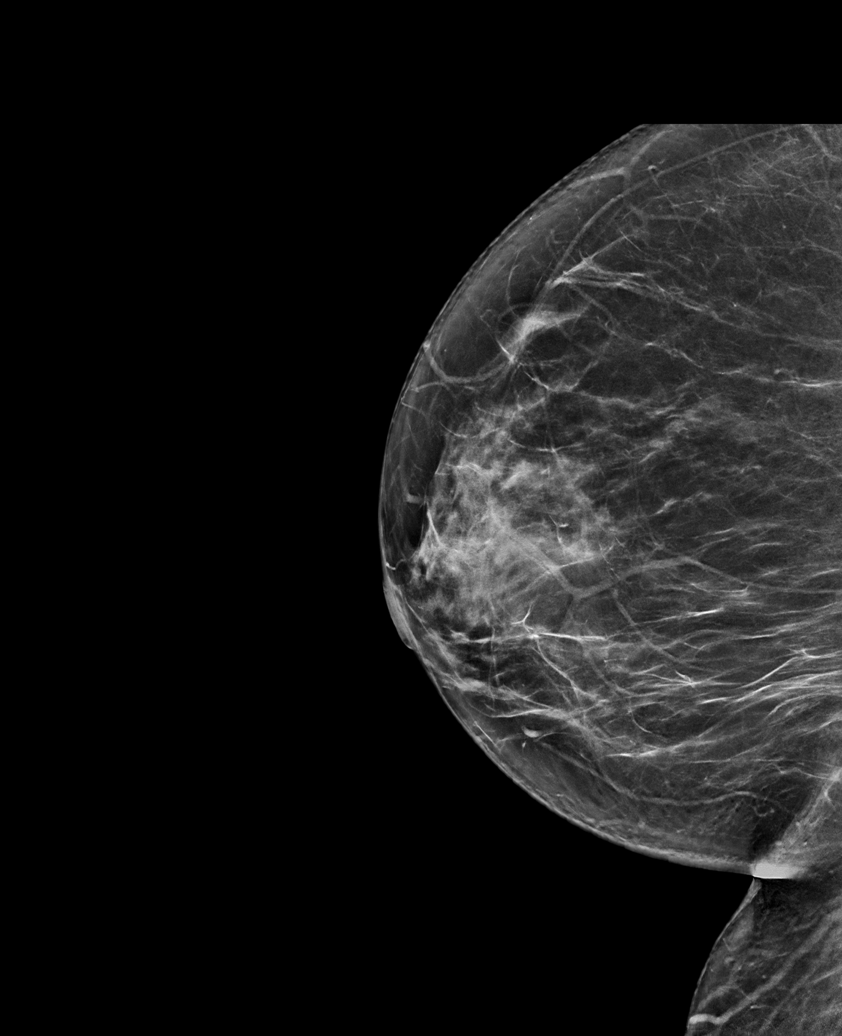

[L CC synth-2D]
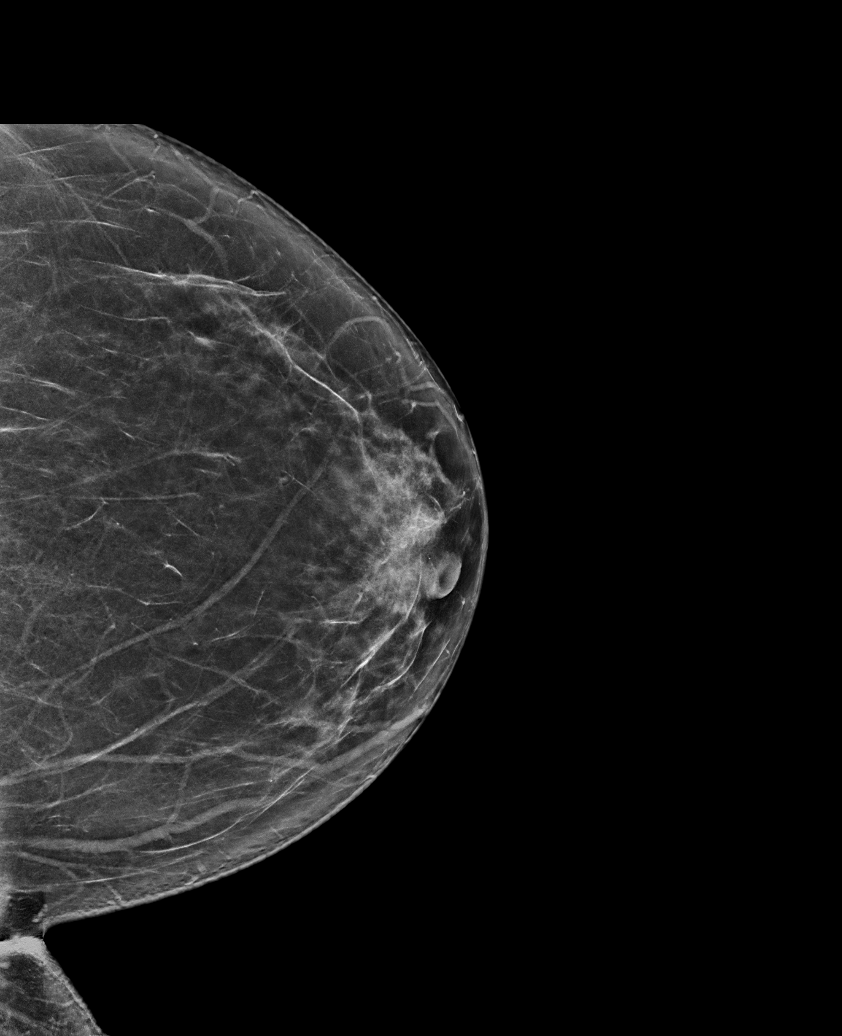

[R MLO synth-2D (2 of 2)]
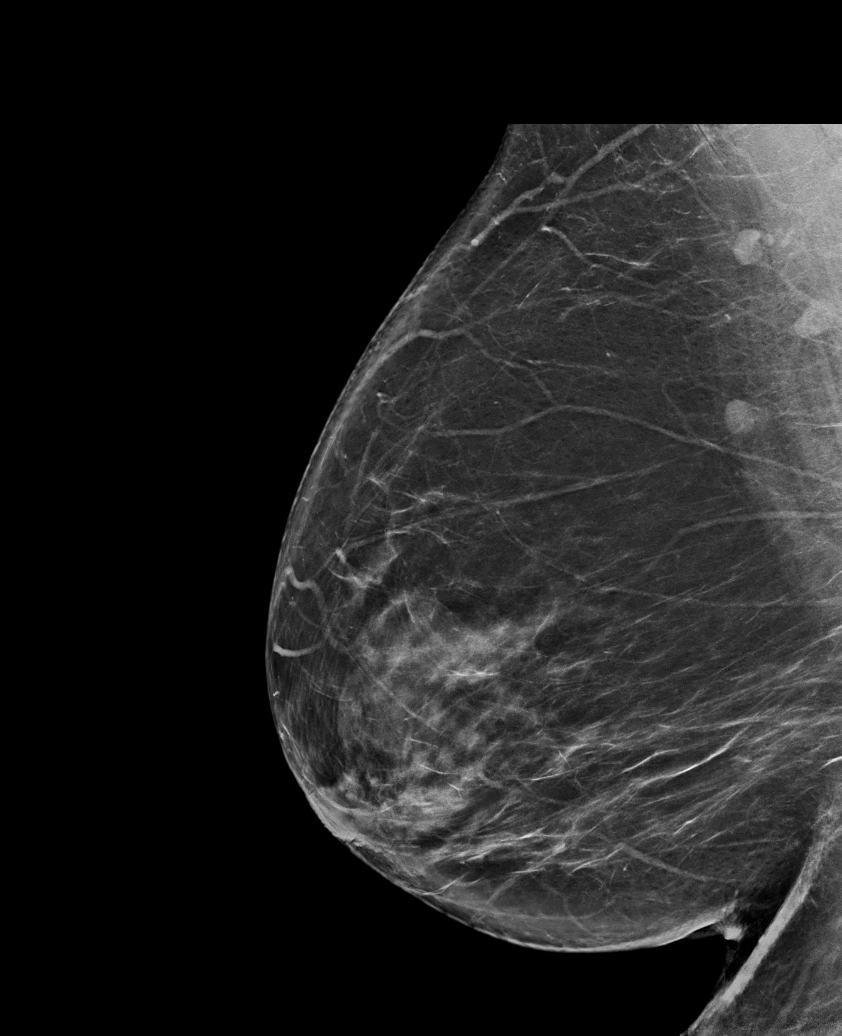

[R CC synth-2D]
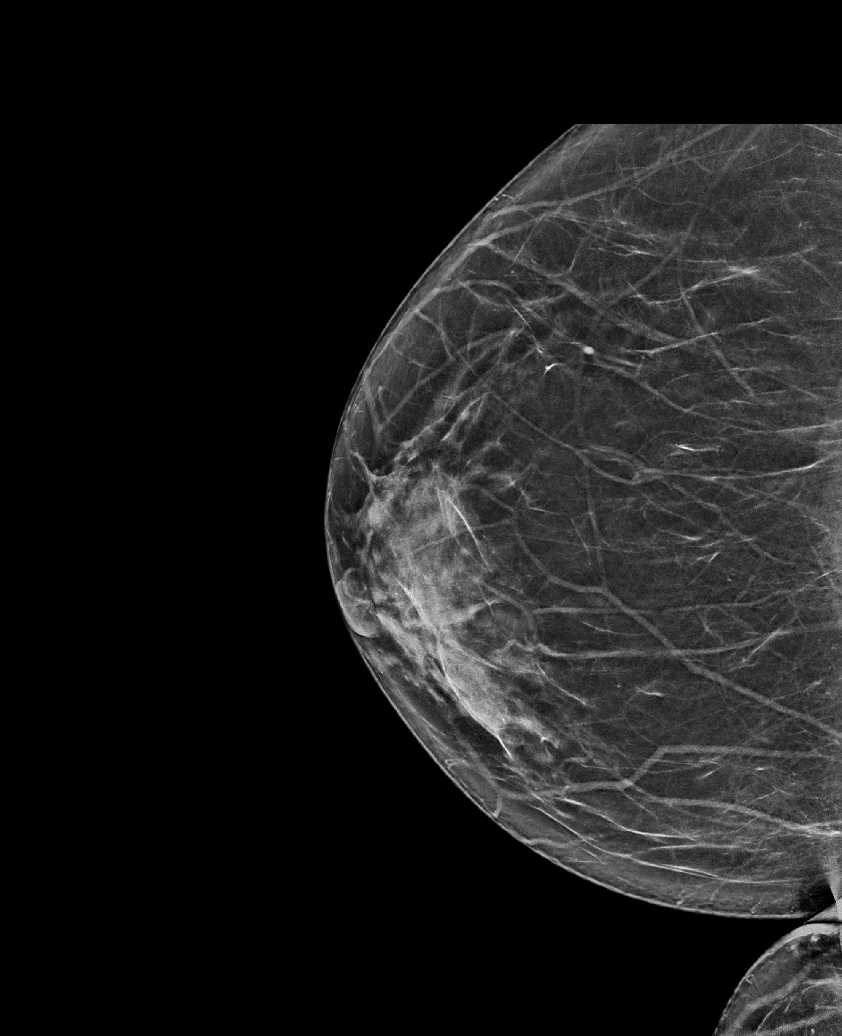

[L MLO synth-2D]
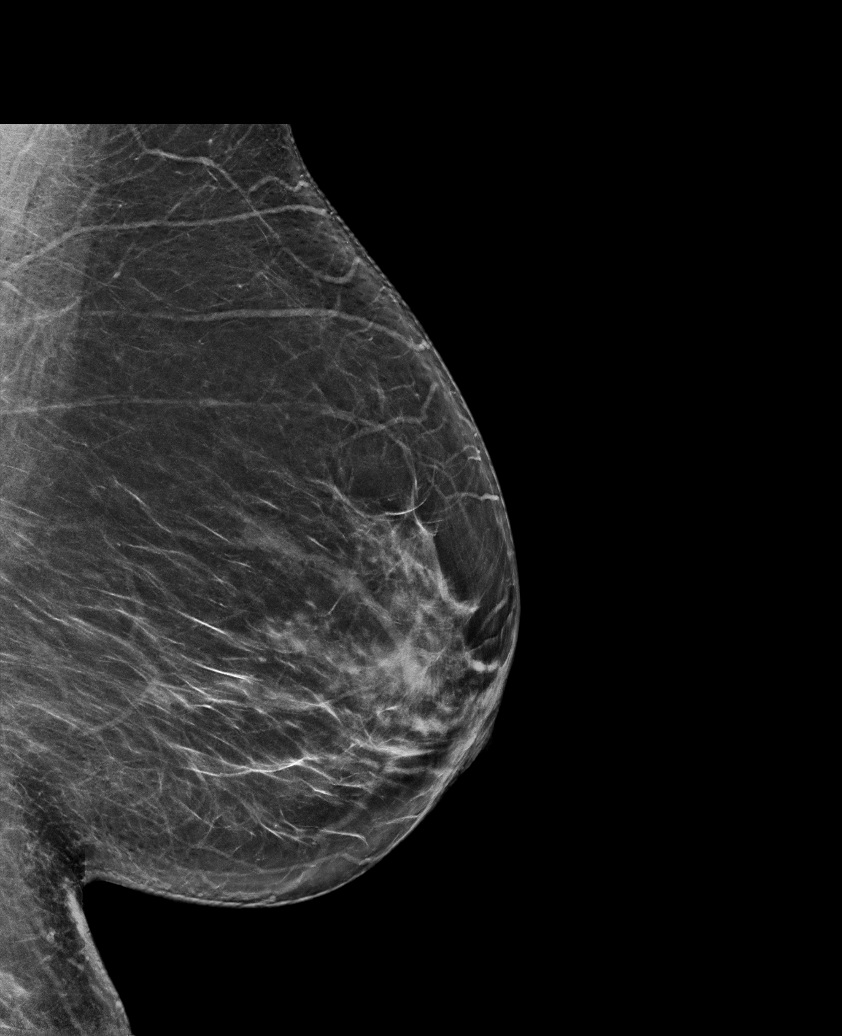

[R MLO tomo · tomo slice 45/89.0]
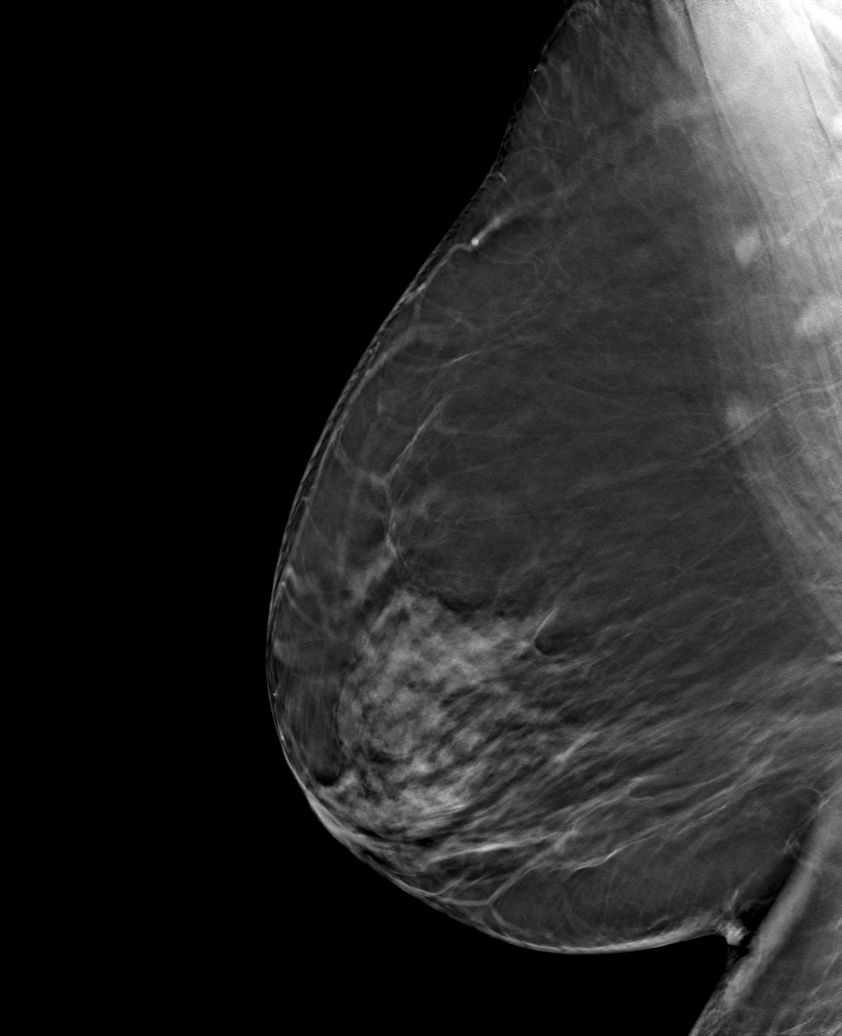

[6 of 30 positions shown; findings below may reference images not displayed]

ACR Breast Density Category b: There are scattered areas of
fibroglandular density.
FINDINGS: There are no findings suspicious for malignancy. Images were
processed with CAD.
IMPRESSION: No mammographic evidence of malignancy. A result letter of this
screening mammogram will be mailed directly to the patient.

RECOMMENDATION:
Screening mammogram in one year. (Code:CN-U-775)

BI-RADS CATEGORY  1: Negative.

## 2020-02-09 ENCOUNTER — Ambulatory Visit: Admission: RE | Admit: 2020-02-09 | Payer: Medicare Other | Source: Ambulatory Visit

## 2020-02-09 ENCOUNTER — Telehealth: Payer: Self-pay | Admitting: *Deleted

## 2020-02-09 NOTE — Telephone Encounter (Signed)
Called pt and only number was cell phone and there is no voicemail set up to leave a message. Then called the person on her list an only number was cell phone and this person did not have voice mail set either. Will try calling again

## 2020-02-10 ENCOUNTER — Inpatient Hospital Stay: Payer: Medicare Other | Attending: Oncology

## 2020-02-10 ENCOUNTER — Inpatient Hospital Stay: Payer: Medicare Other

## 2020-02-10 ENCOUNTER — Inpatient Hospital Stay (HOSPITAL_BASED_OUTPATIENT_CLINIC_OR_DEPARTMENT_OTHER): Payer: Medicare Other | Admitting: Oncology

## 2020-02-10 DIAGNOSIS — Z23 Encounter for immunization: Secondary | ICD-10-CM

## 2020-02-10 DIAGNOSIS — F17210 Nicotine dependence, cigarettes, uncomplicated: Secondary | ICD-10-CM | POA: Diagnosis not present

## 2020-02-10 DIAGNOSIS — Z08 Encounter for follow-up examination after completed treatment for malignant neoplasm: Secondary | ICD-10-CM | POA: Diagnosis not present

## 2020-02-10 DIAGNOSIS — Z85118 Personal history of other malignant neoplasm of bronchus and lung: Secondary | ICD-10-CM

## 2020-02-10 DIAGNOSIS — Z902 Acquired absence of lung [part of]: Secondary | ICD-10-CM | POA: Diagnosis not present

## 2020-02-10 LAB — COMPREHENSIVE METABOLIC PANEL
ALT: 75 U/L — ABNORMAL HIGH (ref 0–44)
AST: 85 U/L — ABNORMAL HIGH (ref 15–41)
Albumin: 3.5 g/dL (ref 3.5–5.0)
Alkaline Phosphatase: 58 U/L (ref 38–126)
Anion gap: 12 (ref 5–15)
BUN: 13 mg/dL (ref 8–23)
CO2: 29 mmol/L (ref 22–32)
Calcium: 9.1 mg/dL (ref 8.9–10.3)
Chloride: 98 mmol/L (ref 98–111)
Creatinine, Ser: 0.82 mg/dL (ref 0.44–1.00)
GFR, Estimated: >60 mL/min (ref >60)
Glucose, Bld: 103 mg/dL — ABNORMAL HIGH (ref 70–99)
Potassium: 3.6 mmol/L (ref 3.5–5.1)
Sodium: 139 mmol/L (ref 135–145)
Total Bilirubin: 0.8 mg/dL (ref 0.3–1.2)
Total Protein: 7.2 g/dL (ref 6.5–8.1)

## 2020-02-10 LAB — CBC WITH DIFFERENTIAL/PLATELET
Abs Immature Granulocytes: 0.01 10*3/uL (ref 0.00–0.07)
Basophils Absolute: 0 10*3/uL (ref 0.0–0.1)
Basophils Relative: 1 %
Eosinophils Absolute: 0.1 10*3/uL (ref 0.0–0.5)
Eosinophils Relative: 1 %
HCT: 43.2 % (ref 36.0–46.0)
Hemoglobin: 14.5 g/dL (ref 12.0–15.0)
Immature Granulocytes: 0 %
Lymphocytes Relative: 48 %
Lymphs Abs: 2 10*3/uL (ref 0.7–4.0)
MCH: 29.9 pg (ref 26.0–34.0)
MCHC: 33.6 g/dL (ref 30.0–36.0)
MCV: 89.1 fL (ref 80.0–100.0)
Monocytes Absolute: 0.4 10*3/uL (ref 0.1–1.0)
Monocytes Relative: 9 %
Neutro Abs: 1.7 10*3/uL (ref 1.7–7.7)
Neutrophils Relative %: 41 %
Platelets: 136 10*3/uL — ABNORMAL LOW (ref 150–400)
RBC: 4.85 MIL/uL (ref 3.87–5.11)
RDW: 12.1 % (ref 11.5–15.5)
WBC: 4.1 10*3/uL (ref 4.0–10.5)
nRBC: 0 % (ref 0.0–0.2)

## 2020-02-10 MED ORDER — INFLUENZA VAC A&B SA ADJ QUAD 0.5 ML IM PRSY
0.5000 mL | PREFILLED_SYRINGE | Freq: Once | INTRAMUSCULAR | Status: AC
  Administered 2020-02-10: 0.5 mL via INTRAMUSCULAR
  Filled 2020-02-10: qty 0.5

## 2020-02-12 NOTE — Progress Notes (Signed)
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 She has a longstanding history of smoking and recently underwent CT.Lung cancer screening which showed a 22.4 mm irregular nodule in the right upper lobe. This was followed by a PET CT scan which showed a spiculated 2.7 cm right upper lobe lung mass with an SUV of 26.8 consistent with primary lung cancer. There was no evidence of hypermetabolic mediastinal or hilar adenopathy. No evidence of metastatic disease elsewhere. Patient referred to [place] for further recommendations. Overall she is doing well. She lives alone and is independent of her ADLs and IADLs. She is also had her PFTs done which have been normal. Reports that her appetite is good and she denies any unintentional weight loss  Patient was seen by Dr. Genevive Bi and underwent right upper lobectomy on 04/02/2018.Final pathology showed 2.5 cm adenocarcinoma, micropapillary predominant with visceral pleural invasion. 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 Benign 0.2 mm nodule of ossified cartilage. 1 lobar and 8 other lymph nodes were examined and were negative for malignancy. Lymphovascular invasion not identified. Margins uninvolved. pT2ApN0.After risks and benefits discussion patient chose not to proceed with adjuvant chemotherapy  Interval  history-patient currently reports doing well and denies any complaints at this time.  Denies any exertional shortness of breath or productive sputum production.  Appetite and weight has remained stable.  ECOG PS- 1 Pain scale- 0 Opioid associated constipation- no  Review of systems- Review of Systems  Constitutional: Negative for chills, fever, malaise/fatigue and weight loss.  HENT: Negative for congestion, ear discharge and nosebleeds.   Eyes: Negative for blurred vision.  Respiratory: Negative for cough, hemoptysis, sputum production, shortness of breath and wheezing.   Cardiovascular: Negative for chest pain, palpitations, orthopnea and claudication.  Gastrointestinal: Negative for abdominal pain, blood in stool, constipation, diarrhea, heartburn, melena, nausea and vomiting.  Genitourinary: Negative for dysuria, flank pain, frequency, hematuria and urgency.  Musculoskeletal: Negative for back pain, joint pain and myalgias.  Skin: Negative for rash.  Neurological: Negative for dizziness, tingling, focal weakness, seizures, weakness and headaches.  Endo/Heme/Allergies: Does not bruise/bleed easily.  Psychiatric/Behavioral: Negative for depression and suicidal ideas. The patient does not have insomnia.       Allergies  Allergen Reactions  . Tramadol Rash     Past Medical History:  Diagnosis Date  . Allergy   . Cataract   . Genital warts   . GERD (gastroesophageal reflux disease)    H/O  . History of kidney stones   . Hyperlipidemia   . Hypertension   . Lung nodule   . Primary cancer of right lung ([place])   . Tobacco dependency      Past Surgical History:  Procedure Laterality Date  . ABDOMINAL HYSTERECTOMY    . CESAREAN SECTION     X2  . KNEE ARTHROSCOPY WITH MEDIAL MENISECTOMY Right 05/22/2016   Procedure: KNEE ARTHROSCOPY WITH PARTIAL MEDIAL MENISECTOMY;  Surgeon: Dereck Leep, MD;  Location:  [facility] ORS;  Service: Orthopedics;  Laterality: Right;  . OOPHORECTOMY     . THORACOTOMY Right 04/02/2018   Procedure: THORACOTOMY AND LUNG RESECTION WITH PREOP BRONCH;  Surgeon: Nestor Lewandowsky, MD;  Location: [facility] ORS;  Service: General;  Laterality: Right;    Social History   Socioeconomic History  . Marital status: Divorced    Spouse name: Not on file  . Number of children: 2  . Years of education: [employer]  . Highest education level: Not on file  Occupational History  . 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 Smoking status: Current Every Day Smoker    Packs/day: 0.50    Years: 51.00    Pack years: 25.50    Types: Cigarettes  . Smokeless tobacco: Never Used  . Tobacco comment: Quit once for 2 years. pt using nicorette gum   Vaping Use  . Vaping Use: Never used  Substance and Sexual Activity  . Alcohol use: Yes    Comment: half a pint of liquor every week  . Drug use: No  . Sexual activity: Not Currently    Birth control/protection: Surgical  Other Topics Concern  . Not on file  Social History Narrative   Lives at home by herself, independent at baseline   Social Determinants of Health   Financial Resource Strain: Not on file  Food Insecurity: Not on file  Transportation Needs: Not on file  Physical Activity: Not on file  Stress: Not on file  Social Connections: Not on file  Intimate Partner Violence: Not on file    Family History  Problem Relation Age of Onset  . Heart disease Mother 90       MI  . Heart attack Mother 20  . Diabetes Mother   . Pancreatic cancer Sister   . Ovarian cancer Sister   . Breast cancer Neg Hx      Current Outpatient Medications:  .  aspirin EC 81 MG tablet, Take 1 tablet (81 mg total) by mouth daily., Disp: , Rfl:  .  Calcium Carbonate-Vitamin D (CALCIUM 600/VITAMIN D PO), Take 1 tablet by mouth daily., Disp: , Rfl:  .  Cholecalciferol (VITAMIN D3) 1000 units CAPS, Take 1,000 Units by mouth daily., Disp: , Rfl:  .  hydrochlorothiazide (MICROZIDE) 12.5 MG  capsule, TAKE 1 CAPSULE(12.5 MG) BY MOUTH DAILY, Disp: 90 capsule, Rfl: 1 .  MAVYRET 100-40 MG TABS, Take 3 tablets by mouth daily., Disp: , Rfl:  .  rosuvastatin (CRESTOR) 5 MG tablet, TAKE 1 TABLET BY MOUTH AT BEDTIME, Disp: 90 tablet, Rfl: 1 .  potassium chloride SA (K-DUR,KLOR-CON) 20 MEQ tablet, Take 1 tablet (20 mEq total) by mouth 2 (two) times daily. (Patient not taking: Reported on 02/10/2020), Disp: 10 tablet, Rfl: 0  Physical exam:  Physical Exam Constitutional:      General: She is not in acute distress. Eyes:     Extraocular Movements: EOM normal.  Cardiovascular:     Rate and Rhythm: Normal rate and regular rhythm.     Heart sounds: Normal heart sounds.  Pulmonary:     Effort: Pulmonary effort is normal.     Breath sounds: Normal breath sounds.  Abdominal:     General: Bowel sounds are normal.     Palpations: Abdomen is soft.  Skin:    General: Skin is warm and dry.  Neurological:     Mental Status: She is alert and oriented to  person, place, and time.      CMP Latest Ref Rng & Units 02/10/2020  Glucose 70 - 99 mg/dL 103(H)  BUN 8 - 23 mg/dL 13  Creatinine 0.44 - 1.00 mg/dL 0.82  Sodium 135 - 145 mmol/L 139  Potassium 3.5 - 5.1 mmol/L 3.6  Chloride 98 - 111 mmol/L 98  CO2 22 - 32 mmol/L 29  Calcium 8.9 - 10.3 mg/dL 9.1  Total Protein 6.5 - 8.1 g/dL 7.2  Total Bilirubin 0.3 - 1.2 mg/dL 0.8  Alkaline Phos 38 - 126 U/L 58  AST 15 - 41 U/L 85(H)  ALT 0 - 44 U/L 75(H)   CBC Latest Ref Rng & Units 02/10/2020  WBC 4.0 - 10.5 K/uL 4.1  Hemoglobin 12.0 - 15.0 g/dL 14.5  Hematocrit 36.0 - 46.0 % 43.2  Platelets 150 - 400 K/uL 136(L)    No images are attached to the encounter.  MM 3D SCREEN BREAST BILATERAL  Result Date: 01/21/2020 CLINICAL DATA:  Screening. EXAM: DIGITAL SCREENING BILATERAL MAMMOGRAM WITH TOMO AND CAD COMPARISON:  Previous exam(s). ACR Breast Density Category b: There are scattered areas of fibroglandular density. FINDINGS: There are no findings  suspicious for malignancy. Images were processed with CAD. IMPRESSION: No mammographic evidence of malignancy. A result letter of this screening mammogram will be mailed directly to the patient. RECOMMENDATION: Screening mammogram in one year. (Code:SM-B-01Y) BI-RADS CATEGORY  1: Negative. Electronically Signed   By: Lillia Mountain M.D.   On: 01/21/2020 13:06     Assessment and plan- Patient is a 69 y.o. 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   She is here for routine follow-up of lung cancer  Patient did not go for her CT scan that was scheduled recently and we will have to reschedule the CT for later this month.  I will tentatively see her back in 7 months after I get another CT scan in 6 months from the most recent one.   Visit Diagnosis 1. Encounter for follow-up surveillance of lung cancer      Dr. Randa Evens, MD, MPH [facility] at [facility] [phone] 02/12/2020 4:06 PM

## 2020-02-24 ENCOUNTER — Ambulatory Visit
Admission: RE | Admit: 2020-02-24 | Discharge: 2020-02-24 | Disposition: A | Discharge status: Home / Self Care | Payer: Medicare Other | Source: Ambulatory Visit | Attending: Oncology | Admitting: Oncology

## 2020-02-24 ENCOUNTER — Other Ambulatory Visit: Payer: Self-pay

## 2020-02-24 ENCOUNTER — Other Ambulatory Visit: Payer: Self-pay | Admitting: *Deleted

## 2020-02-24 DIAGNOSIS — Z08 Encounter for follow-up examination after completed treatment for malignant neoplasm: Secondary | ICD-10-CM | POA: Diagnosis not present

## 2020-02-24 DIAGNOSIS — C3491 Malignant neoplasm of unspecified part of right bronchus or lung: Secondary | ICD-10-CM | POA: Diagnosis not present

## 2020-02-24 DIAGNOSIS — I700 Atherosclerosis of aorta: Secondary | ICD-10-CM | POA: Diagnosis not present

## 2020-02-24 DIAGNOSIS — Z85118 Personal history of other malignant neoplasm of bronchus and lung: Secondary | ICD-10-CM | POA: Insufficient documentation

## 2020-02-24 MED ORDER — IOHEXOL 300 MG/ML  SOLN
75.0000 mL | Freq: Once | INTRAMUSCULAR | Status: AC | PRN
  Administered 2020-02-24: 75 mL via INTRAVENOUS

## 2020-03-19 ENCOUNTER — Telehealth: Payer: Self-pay | Admitting: Family Medicine

## 2020-03-19 NOTE — Telephone Encounter (Signed)
Copied from [place] [id number]. Topic: Medicare AWV >> Mar 19, 2020 11:19 AM Cher Nakai R wrote: Reason for CRM:   No answer unable to notify AWVS visit will be by phone not in office  on Jan 18,2022 -srs

## 2020-03-23 ENCOUNTER — Telehealth: Payer: Self-pay

## 2020-03-23 ENCOUNTER — Ambulatory Visit (INDEPENDENT_AMBULATORY_CARE_PROVIDER_SITE_OTHER): Payer: Medicare Other

## 2020-03-23 VITALS — Ht 60.0 in | Wt 189.0 lb

## 2020-03-23 DIAGNOSIS — Z0000 Encounter for general adult medical examination without abnormal findings: Secondary | ICD-10-CM

## 2020-03-23 NOTE — Telephone Encounter (Signed)
Copied from [place] [id number]. Topic: Appointment Scheduling - Scheduling Inquiry for Clinic >> Mar 22, 2020  9:12 AM Oneta Rack wrote: Reason for CRM: patient would like to [facility] her telephone AWV appointment scheduled for 03/23/2020, please advise

## 2020-03-23 NOTE — Progress Notes (Signed)
I connected with Pensions consultant today by telephone and verified that I am speaking with the correct person using two identifiers. Location patient: home Location provider: work Persons participating in the virtual visit: Pensions consultant, [employer] LPN.   I discussed the limitations, risks, security and privacy concerns of performing an evaluation and management service by telephone and the availability of in person appointments. I also discussed with the patient that there may be a patient responsible charge related to this service. The patient expressed understanding and verbally consented to this telephonic visit.    Interactive audio and video telecommunications were attempted between this provider and patient, however failed, due to patient having technical difficulties OR patient did not have access to video capability.  We continued and completed visit with audio only.     Vital signs may be patient reported or missing.  Subjective:   Joanna Morris is a 70 y.o. female who presents for Medicare Annual (Subsequent) preventive examination.  Review of Systems     Cardiac Risk Factors include: advanced age (>22men, >21 women);hypertension;obesity (BMI >30kg/m2);smoking/ tobacco exposure     Objective:    Today's Vitals   03/23/20 0821  Weight: 189 lb (85.7 kg)  Height: 5' (1.524 m)   Body mass index is 36.91 kg/m.  Advanced Directives 03/23/2020 08/08/2019 03/18/2019 02/06/2019 09/05/2018 05/03/2018 04/02/2018  Does Patient Have a Medical Advance Directive? No No No No No No No  Type of Advance Directive - - - - - - -  Copy of Healthcare Power of Attorney in Chart? - - - - - - -  Would patient like information on creating a medical advance directive? - No - Patient declined - No - Patient declined - No - Patient declined No - Patient declined    Current Medications (verified) Outpatient Encounter Medications as of 03/23/2020  Medication Sig  . aspirin EC 81 MG tablet Take 1  tablet (81 mg total) by mouth daily.  . Calcium Carbonate-Vitamin D (CALCIUM 600/VITAMIN D PO) Take 1 tablet by mouth daily.  . Cholecalciferol (VITAMIN D3) 1000 units CAPS Take 1,000 Units by mouth daily.  . hydrochlorothiazide (MICROZIDE) 12.5 MG capsule TAKE 1 CAPSULE(12.5 MG) BY MOUTH DAILY  . rosuvastatin (CRESTOR) 5 MG tablet TAKE 1 TABLET BY MOUTH AT BEDTIME  . MAVYRET 100-40 MG TABS Take 3 tablets by mouth daily. (Patient not taking: Reported on 03/23/2020)  . potassium chloride SA (K-DUR,KLOR-CON) 20 MEQ tablet Take 1 tablet (20 mEq total) by mouth 2 (two) times daily. (Patient not taking: No sig reported)   No facility-administered encounter medications on file as of 03/23/2020.    Allergies (verified) Tramadol   History: Past Medical History:  Diagnosis Date  . Allergy   . Cataract   . Genital warts   . GERD (gastroesophageal reflux disease)    H/O  . History of kidney stones   . Hyperlipidemia   . Hypertension   . Lung nodule   . Primary cancer of right lung ([place])   . Tobacco dependency    Past Surgical History:  Procedure Laterality Date  . ABDOMINAL HYSTERECTOMY    . CESAREAN SECTION     X2  . KNEE ARTHROSCOPY WITH MEDIAL MENISECTOMY Right 05/22/2016   Procedure: KNEE ARTHROSCOPY WITH PARTIAL MEDIAL MENISECTOMY;  Surgeon: Dereck Leep, MD;  Location: [facility] ORS;  Service: Orthopedics;  Laterality: Right;  . OOPHORECTOMY    . THORACOTOMY Right 04/02/2018   Procedure: THORACOTOMY AND LUNG RESECTION WITH PREOP BRONCH;  Surgeon: Nestor Lewandowsky, MD;  Location: [facility] ORS;  Service: General;  Laterality: Right;   Family History  Problem Relation Age of Onset  . Heart disease Mother 72       MI  . Heart attack Mother 10  . Diabetes Mother   . Pancreatic cancer Sister   . Ovarian cancer Sister   . Breast cancer Neg Hx    Social History   Socioeconomic History  . Marital status: Divorced    Spouse name: Not on file  . Number of children: 2  . Years of education:  [employer]  . Highest education level: Not on file  Occupational History  . 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 Smoking status: Current Every Day Smoker    Packs/day: 0.50    Years: 51.00    Pack years: 25.50    Types: Cigarettes  . Smokeless tobacco: Never Used  . Tobacco comment: Quit once for 2 years. pt using nicorette gum   Vaping Use  . Vaping Use: Never used  Substance and Sexual Activity  . Alcohol use: Yes    Comment: half a pint of liquor every week  . Drug use: No  . Sexual activity: Not Currently    Birth control/protection: Surgical  Other Topics Concern  . Not on file  Social History Narrative   Lives at home by herself, independent at baseline   Social Determinants of Health   Financial Resource Strain: Low Risk   . Difficulty of Paying Living Expenses: Not hard at all  Food Insecurity: No Food Insecurity  . Worried About Charity fundraiser in the Last Year: Never true  . Ran Out of Food in the Last Year: Never true  Transportation Needs: No Transportation Needs  . Lack of Transportation (Medical): No  . Lack of Transportation (Non-Medical): No  Physical Activity: Sufficiently Active  . Days of Exercise per Week: 5 days  . Minutes of Exercise per Session: 60 min  Stress: No Stress Concern Present  . Feeling of Stress : Not at all  Social Connections: Not on file    Tobacco Counseling Ready to quit: Yes Counseling given: Not Answered Comment: Quit once for 2 years. pt using nicorette gum    Clinical Intake:  Pre-visit preparation completed: Yes  Pain : No/denies pain     Nutritional Status: BMI > 30  Obese Nutritional Risks: None Diabetes: No  How often do you need to have someone help you when you read instructions, pamphlets, or other written materials from your doctor or pharmacy?: 1 - Never What is the last grade level you completed in school?: 12th grade  Diabetic? no  Interpreter  Needed?: No  Information entered by :: NAllen LPN   Activities of Daily Living In your present state of health, do you have any difficulty performing the following activities: 03/23/2020  Hearing? N  Vision? N  Difficulty concentrating or making decisions? N  Walking or climbing stairs? N  Dressing or bathing? N  Doing errands, shopping? N  Preparing Food and eating ? N  Using the Toilet? N  Do you have problems with loss of bowel control? N  Managing your Medications? N  Managing your Finances? N  Housekeeping or managing your Housekeeping? N  Some recent data might be hidden    Patient Care Team: Olin Hauser, DO as PCP - General (Family Medicine) Marry Guan, Laurice Record, MD  as Financial risk analyst Physician (Orthopedic Surgery) Telford Nab, RN as Registered Nurse  Indicate any recent [place] you may have received from other than [facility] providers in the past year (date may be approximate).     Assessment:   This is a routine wellness examination for Joanna Morris.  Hearing/Vision screen No exam data present  Dietary issues and exercise activities discussed: Current Exercise Habits: Home exercise routine, Type of exercise: walking, Time (Minutes): 60, Frequency (Times/Week): 5, Weekly Exercise (Minutes/Week): 300  Goals    . Exercise 150 minutes per week (moderate activity)     Recommend gradually increasing distance walked from 1 mile to 2 miles    . Patient Stated     03/23/2020, no goals    . Quit Smoking     Pt would like to stop smoking completely.      Depression Screen PHQ 2/9 Scores 03/23/2020 06/23/2019 03/18/2019 01/17/2019 07/18/2018 03/13/2018 01/16/2018  PHQ - 2 Score 0 0 0 0 0 0 0    Fall Risk Fall Risk  03/23/2020 06/23/2019 03/18/2019 01/17/2019 07/18/2018  Falls in the past year? 0 0 0 0 0  Number falls in past yr: - 0 0 0 -  Injury with Fall? - 0 0 - -  Risk for fall due to : Medication side effect - - - -  Risk for fall due to: Comment - - - - -   Follow up Falls evaluation completed;Education provided;Falls prevention discussed Falls evaluation completed - Falls evaluation completed Falls evaluation completed    FALL RISK PREVENTION PERTAINING TO THE HOME:  Any stairs in or around the home? Yes  If so, are there any without handrails? Yes  Home free of loose throw rugs in walkways, pet beds, electrical cords, etc? Yes  Adequate lighting in your home to reduce risk of falls? Yes   ASSISTIVE DEVICES UTILIZED TO PREVENT FALLS:  Life alert? No  Use of a cane, walker or w/c? No  Grab bars in the bathroom? Yes  Shower chair or bench in shower? No  Elevated toilet seat or a handicapped toilet? Yes   TIMED UP AND GO:  Was the test performed? No . .    Cognitive Function:     6CIT Screen 03/23/2020 03/18/2019 03/13/2018 12/05/2016  What Year? 0 points 0 points 0 points 0 points  What month? 0 points 0 points 0 points 0 points  What time? 0 points 0 points 0 points 0 points  Count back from 20 0 points 0 points 0 points 0 points  Months in reverse 0 points 0 points 0 points 0 points  Repeat phrase 0 points 0 points 2 points 2 points  Total Score 0 0 2 2    Immunizations Immunization History  Administered Date(s) Administered  . Fluad Quad(high Dose 65+) 01/17/2019, 02/10/2020  . Influenza, High Dose Seasonal PF 12/27/2016, 01/16/2018  . PFIZER(Purple Top)SARS-COV-2 Vaccination 05/01/2019, 05/27/2019    TDAP status: Due, Education has been provided regarding the importance of this vaccine. Advised may receive this vaccine at local pharmacy or Health Dept. Aware to provide a copy of the vaccination record if obtained from local pharmacy or Health Dept. Verbalized acceptance and understanding.  Flu Vaccine status: Up to date  Pneumococcal vaccine status: Declined,  Education has been provided regarding the importance of this vaccine but patient still declined. Advised may receive this vaccine at local pharmacy or Health Dept.  Aware to provide a copy of the vaccination record if obtained from local pharmacy  or Health Dept. Verbalized acceptance and understanding.   Covid-19 vaccine status: Completed vaccines  Qualifies for Shingles Vaccine? Yes   Zostavax completed No   Shingrix Completed?: No.    Education has been provided regarding the importance of this vaccine. Patient has been advised to call insurance company to determine out of pocket expense if they have not yet received this vaccine. Advised may also receive vaccine at local pharmacy or Health Dept. Verbalized acceptance and understanding.  Screening Tests Health Maintenance  Topic Date Due  . COVID-19 Vaccine (3 - Pfizer risk 4-dose series) 06/24/2019  . COLON CANCER SCREENING ANNUAL FOBT  12/21/2019  . PNA vac Low Risk Adult (1 of 2 - PCV13) 01/16/2021 (Originally 05/16/2013)  . TETANUS/TDAP  12/09/2025 (Originally 05/17/1969)  . MAMMOGRAM  01/18/2022  . COLONOSCOPY (Pts 45-26yrs Insurance coverage will need to be confirmed)  12/09/2025  . INFLUENZA VACCINE  Completed  . DEXA SCAN  Completed  . Hepatitis C Screening  Completed    Health Maintenance  Health Maintenance Due  Topic Date Due  . COVID-19 Vaccine (3 - Pfizer risk 4-dose series) 06/24/2019  . COLON CANCER SCREENING ANNUAL FOBT  12/21/2019    Colorectal cancer screening: Type of screening: Colonoscopy. Completed 12/10/2015. Repeat every 10 years  Mammogram status: Completed 01/19/2020. Repeat every year  Bone Density status: Completed 01/08/2017. Results reflect: Bone density results: OSTEOPENIA. Repeat every 2 years.  Lung Cancer Screening: (Low Dose CT Chest recommended if Age 66-80 years, 30 pack-year currently smoking OR have quit w/in 15years.) does not qualify.   Lung Cancer Screening Referral: no  Additional Screening:  Hepatitis C Screening: does qualify; Completed 06/23/2019  Vision Screening: Recommended annual ophthalmology exams for early detection of glaucoma and  other disorders of the eye. Is the patient up to date with their annual eye exam?  No  Who is the provider or what is the name of the office in which the patient attends annual eye exams? none If pt is not established with a provider, would they like to be referred to a provider to establish care? No .   Dental Screening: Recommended annual dental exams for proper oral hygiene  Community Resource Referral / Chronic Care Management: CRR required this visit?  No   CCM required this visit?  No      Plan:     I have personally reviewed and noted the following in the patient's chart:   . Medical and social history . Use of alcohol, tobacco or illicit drugs  . Current medications and supplements . Functional ability and status . Nutritional status . Physical activity . Advanced directives . List of other physicians . Hospitalizations, surgeries, and ER visits in previous 12 months . Vitals . Screenings to include cognitive, depression, and falls . Referrals and appointments  In addition, I have reviewed and discussed with patient certain preventive protocols, quality metrics, and best practice recommendations. A written personalized care plan for preventive services as well as general preventive health recommendations were provided to patient.     Kellie Simmering, LPN   [id number]   Nurse Notes:

## 2020-03-23 NOTE — Patient Instructions (Signed)
[name] , Thank you for taking time to come for your Medicare Wellness Visit. I appreciate your ongoing commitment to your health goals. Please review the following plan we discussed and let me know if I can assist you in the future.   Screening recommendations/referrals: Colonoscopy: completed 12/10/2015, due 12/09/2025 Mammogram: completed 01/19/2020, due 01/18/2021 Bone Density: completed 01/08/2017 Recommended yearly ophthalmology/optometry visit for glaucoma screening and checkup Recommended yearly dental visit for hygiene and checkup  Vaccinations: Influenza vaccine: completed 02/10/2020, due 10/04/2020 Pneumococcal vaccine: decline Tdap vaccine: decline Shingles vaccine: discussed   Covid-19: 01/03/2020, 05/27/2019, 05/01/2019  Advanced directives: Advance directive discussed with you today. Even though you declined this today please call our office should you change your mind and we can give you the proper paperwork for you to fill out.  Conditions/risks identified: smoking  Next appointment: Follow up in one year for your annual wellness visit    Preventive Care 35 Years and Older, Female Preventive care refers to lifestyle choices and visits with your health care provider that can promote health and wellness. What does preventive care include?  A yearly physical exam. This is also called an annual well check.  Dental exams once or twice a year.  Routine eye exams. Ask your health care provider how often you should have your eyes checked.  Personal lifestyle choices, including:  Daily care of your teeth and gums.  Regular physical activity.  Eating a healthy diet.  Avoiding tobacco and drug use.  Limiting alcohol use.  Practicing safe sex.  Taking low-dose aspirin every day.  Taking vitamin and mineral supplements as recommended by your health care provider. What happens during an annual well check? The services and screenings done by your health care provider  during your annual well check will depend on your age, overall health, lifestyle risk factors, and family history of disease. Counseling  Your health care provider may ask you questions about your:  Alcohol use.  Tobacco use.  Drug use.  Emotional well-being.  Home and relationship well-being.  Sexual activity.  Eating habits.  History of falls.  Memory and ability to understand (cognition).  Work and work Statistician.  Reproductive health. Screening  You may have the following tests or measurements:  Height, weight, and BMI.  Blood pressure.  Lipid and cholesterol levels. These may be checked every 5 years, or more frequently if you are over 92 years old.  Skin check.  Lung cancer screening. You may have this screening every year starting at age 105 if you have a 30-pack-year history of smoking and currently smoke or have quit within the past 15 years.  Fecal occult blood test (FOBT) of the stool. You may have this test every year starting at age 9.  Flexible sigmoidoscopy or colonoscopy. You may have a sigmoidoscopy every 5 years or a colonoscopy every 10 years starting at age 32.  Hepatitis C blood test.  Hepatitis B blood test.  Sexually transmitted disease (STD) testing.  Diabetes screening. This is done by checking your blood sugar (glucose) after you have not eaten for a while (fasting). You may have this done every 1-3 years.  Bone density scan. This is done to screen for osteoporosis. You may have this done starting at age 57.  Mammogram. This may be done every 1-2 years. Talk to your health care provider about how often you should have regular mammograms. Talk with your health care provider about your test results, treatment options, and if necessary, the need for  more tests. Vaccines  Your health care provider may recommend certain vaccines, such as:  Influenza vaccine. This is recommended every year.  Tetanus, diphtheria, and acellular pertussis  (Tdap, Td) vaccine. You may need a Td booster every 10 years.  Zoster vaccine. You may need this after age 76.  Pneumococcal 13-valent conjugate (PCV13) vaccine. One dose is recommended after age 28.  Pneumococcal polysaccharide (PPSV23) vaccine. One dose is recommended after age 54. Talk to your health care provider about which screenings and vaccines you need and how often you need them. This information is not intended to replace advice given to you by your health care provider. Make sure you discuss any questions you have with your health care provider. Document Released: 03/19/2015 Document Revised: 11/10/2015 Document Reviewed: 12/22/2014 Elsevier Interactive Patient Education  2017 [place] Prevention in the Home Falls can cause injuries. They can happen to people of all ages. There are many things you can do to make your home safe and to help prevent falls. What can I do on the outside of my home?  Regularly fix the edges of walkways and driveways and fix any cracks.  Remove anything that might make you trip as you walk through a door, such as a raised step or threshold.  Trim any bushes or trees on the path to your home.  Use bright outdoor lighting.  Clear any walking paths of anything that might make someone trip, such as rocks or tools.  Regularly check to see if handrails are loose or broken. Make sure that both sides of any steps have handrails.  Any raised decks and porches should have guardrails on the edges.  Have any leaves, snow, or ice cleared regularly.  Use sand or salt on walking paths during winter.  Clean up any spills in your garage right away. This includes oil or grease spills. What can I do in the bathroom?  Use night lights.  Install grab bars by the toilet and in the tub and shower. Do not use towel bars as grab bars.  Use non-skid mats or decals in the tub or shower.  If you need to sit down in the shower, use a plastic, non-slip  stool.  Keep the floor dry. Clean up any water that spills on the floor as soon as it happens.  Remove soap buildup in the tub or shower regularly.  Attach bath mats securely with double-sided non-slip rug tape.  Do not have throw rugs and other things on the floor that can make you trip. What can I do in the bedroom?  Use night lights.  Make sure that you have a light by your bed that is easy to reach.  Do not use any sheets or blankets that are too big for your bed. They should not hang down onto the floor.  Have a firm chair that has side arms. You can use this for support while you get dressed.  Do not have throw rugs and other things on the floor that can make you trip. What can I do in the kitchen?  Clean up any spills right away.  Avoid walking on wet floors.  Keep items that you use a lot in easy-to-reach places.  If you need to reach something above you, use a strong step stool that has a grab bar.  Keep electrical cords out of the way.  Do not use floor polish or wax that makes floors slippery. If you must use wax, use non-skid  floor wax.  Do not have throw rugs and other things on the floor that can make you trip. What can I do with my stairs?  Do not leave any items on the stairs.  Make sure that there are handrails on both sides of the stairs and use them. Fix handrails that are broken or loose. Make sure that handrails are as long as the stairways.  Check any carpeting to make sure that it is firmly attached to the stairs. Fix any carpet that is loose or worn.  Avoid having throw rugs at the top or bottom of the stairs. If you do have throw rugs, attach them to the floor with carpet tape.  Make sure that you have a light switch at the top of the stairs and the bottom of the stairs. If you do not have them, ask someone to add them for you. What else can I do to help prevent falls?  Wear shoes that:  Do not have high heels.  Have rubber bottoms.  Are  comfortable and fit you well.  Are closed at the toe. Do not wear sandals.  If you use a stepladder:  Make sure that it is fully opened. Do not climb a closed stepladder.  Make sure that both sides of the stepladder are locked into place.  Ask someone to hold it for you, if possible.  Clearly mark and make sure that you can see:  Any grab bars or handrails.  First and last steps.  Where the edge of each step is.  Use tools that help you move around (mobility aids) if they are needed. These include:  Canes.  Walkers.  Scooters.  Crutches.  Turn on the lights when you go into a dark area. Replace any light bulbs as soon as they burn out.  Set up your furniture so you have a clear path. Avoid moving your furniture around.  If any of your floors are uneven, fix them.  If there are any pets around you, be aware of where they are.  Review your medicines with your doctor. Some medicines can make you feel dizzy. This can increase your chance of falling. Ask your doctor what other things that you can do to help prevent falls. This information is not intended to replace advice given to you by your health care provider. Make sure you discuss any questions you have with your health care provider. Document Released: 12/17/2008 Document Revised: 07/29/2015 Document Reviewed: 03/27/2014 Elsevier Interactive Patient Education  2017 [employer].

## 2020-03-23 NOTE — Telephone Encounter (Signed)
Patient consented to telehealth visit.

## 2020-05-24 ENCOUNTER — Other Ambulatory Visit: Payer: Self-pay | Admitting: Family Medicine

## 2020-05-24 DIAGNOSIS — E7800 Pure hypercholesterolemia, unspecified: Secondary | ICD-10-CM

## 2020-05-24 NOTE — Telephone Encounter (Signed)
Requested medication (s) are due for refill today: yes  Requested medication (s) are on the active medication list: yes   Future visit scheduled: no  Notes to clinic:  Patient had a medicare wellness on  03/23/2020 Patient due for labs    Requested Prescriptions  Pending Prescriptions Disp Refills   rosuvastatin (CRESTOR) 5 MG tablet [Pharmacy Med Name: ROSUVASTATIN 5MG  TABLETS] 90 tablet 1    Sig: TAKE 1 TABLET BY MOUTH AT BEDTIME      Cardiovascular:  Antilipid - Statins Failed - 05/24/2020  6:29 AM      Failed - Total Cholesterol in normal range and within 360 days    Cholesterol  Date Value Ref Range Status  01/13/2019 140 <200 mg/dL Final          Failed - LDL in normal range and within 360 days    LDL Cholesterol (Calc)  Date Value Ref Range Status  01/13/2019 59 mg/dL (calc) Final    Comment:    Reference range: <100 . Desirable range <100 mg/dL for primary prevention;   <70 mg/dL for patients with CHD or diabetic patients  with > or = 2 CHD risk factors. Marland Kitchen LDL-C is now calculated using the Martin-Hopkins  calculation, which is a validated novel method providing  better accuracy than the Friedewald equation in the  estimation of LDL-C.  Cresenciano Genre et al. Annamaria Helling. 2671;245(80): 2061-2068  (http://education.[facility].com/faq/FAQ164)           Failed - HDL in normal range and within 360 days    HDL  Date Value Ref Range Status  01/13/2019 64 > OR = 50 mg/dL Final          Failed - Triglycerides in normal range and within 360 days    Triglycerides  Date Value Ref Range Status  01/13/2019 83 <150 mg/dL Final          Passed - Patient is not pregnant      Passed - Valid encounter within last 12 months    Recent Outpatient Visits           11 months ago Essential hypertension   [place], Devonne Doughty, DO   1 year ago Annual physical exam   [facility] Olin Hauser, DO   1 year ago  Essential hypertension   [place], Devonne Doughty, DO   2 years ago Annual physical exam   [facility] Olin Hauser, DO   2 years ago Routine medical exam   [facility] Olin Hauser, DO       Future Appointments             In 10 months [facility], [facility]

## 2020-08-22 ENCOUNTER — Other Ambulatory Visit: Payer: Self-pay | Admitting: Family Medicine

## 2020-08-22 DIAGNOSIS — I10 Essential (primary) hypertension: Secondary | ICD-10-CM

## 2020-08-22 NOTE — Telephone Encounter (Signed)
Requested Prescriptions  Pending Prescriptions Disp Refills  . hydrochlorothiazide (MICROZIDE) 12.5 MG capsule [Pharmacy Med Name: HYDROCHLOROTHIAZIDE 12.5MG  CAPSULES] 3 capsule 0    Sig: TAKE 1 CAPSULE(12.5 MG) BY MOUTH DAILY     Cardiovascular: Diuretics - Thiazide Failed - 08/22/2020  6:34 AM      Failed - Last BP in normal range    BP Readings from Last 1 Encounters:  08/08/19 (!) 157/87         Failed - Valid encounter within last 6 months    Recent Outpatient Visits          1 year ago Essential hypertension   [place], Devonne Doughty, DO   1 year ago Annual physical exam   [facility] Olin Hauser, DO   2 years ago Essential hypertension   [place], Devonne Doughty, DO   2 years ago Annual physical exam   [facility] Olin Hauser, DO   2 years ago Routine medical exam   [place], DO      Future Appointments            In 5 days [place], Devonne Doughty, [facility], [place]   In 7 months  [facility], [place] in normal range and within 360 days    Calcium  Date Value Ref Range Status  02/10/2020 9.1 8.9 - 10.3 mg/dL Final         Passed - Cr in normal range and within 360 days    Creat  Date Value Ref Range Status  01/13/2019 0.83 0.60 - 0.93 mg/dL Final    Comment:    For patients >43 years of age, the reference limit for Creatinine is approximately 13% higher for people identified as African-American. .    Creatinine, Ser  Date Value Ref Range Status  02/10/2020 0.82 0.44 - 1.00 mg/dL Final         Passed - K in normal range and within 360 days    Potassium  Date Value Ref Range Status  02/10/2020 3.6 3.5 - 5.1 mmol/L Final         Passed - Na in normal range and within 360 days    Sodium  Date Value Ref Range Status  02/10/2020 139 135 - 145  mmol/L Final

## 2020-08-24 ENCOUNTER — Ambulatory Visit: Payer: Medicare Other | Attending: Oncology

## 2020-08-27 ENCOUNTER — Encounter: Payer: Self-pay | Admitting: Family Medicine

## 2020-08-27 ENCOUNTER — Ambulatory Visit (INDEPENDENT_AMBULATORY_CARE_PROVIDER_SITE_OTHER): Payer: Medicare Other | Admitting: Family Medicine

## 2020-08-27 ENCOUNTER — Other Ambulatory Visit: Payer: Self-pay | Admitting: Family Medicine

## 2020-08-27 ENCOUNTER — Other Ambulatory Visit: Payer: Self-pay

## 2020-08-27 DIAGNOSIS — E7800 Pure hypercholesterolemia, unspecified: Secondary | ICD-10-CM

## 2020-08-27 DIAGNOSIS — Z0000 Encounter for general adult medical examination without abnormal findings: Secondary | ICD-10-CM

## 2020-08-27 DIAGNOSIS — I10 Essential (primary) hypertension: Secondary | ICD-10-CM | POA: Diagnosis not present

## 2020-08-27 DIAGNOSIS — R7309 Other abnormal glucose: Secondary | ICD-10-CM

## 2020-08-27 DIAGNOSIS — E669 Obesity, unspecified: Secondary | ICD-10-CM

## 2020-08-27 MED ORDER — HYDROCHLOROTHIAZIDE 12.5 MG PO CAPS: 12.5000 mg | ORAL_CAPSULE | Freq: Every day | ORAL | 3 refills | Status: DC

## 2020-08-27 MED ORDER — ROSUVASTATIN CALCIUM 5 MG PO TABS: 5.0000 mg | ORAL_TABLET | Freq: Every day | ORAL | 3 refills | Status: DC

## 2020-08-27 NOTE — Assessment & Plan Note (Signed)
Elevated BP today need to refill meds Admits smoking today raised BP - Home BP readings reported normal No known complications  Plan:  1. Continue current BP regimen - HCTZ 12.5mg  daily  refill 2. Encourage improved lifestyle - low sodium diet, regular exercise - need to return to ortho for knee 3. Continue monitor BP outside office, bring readings to next visit, if persistently >140/90 or new symptoms notify office sooner

## 2020-08-27 NOTE — Assessment & Plan Note (Addendum)
Controlled cholesterol on statin / lifestyle The 10-year ASCVD risk score Mikey Bussing DC Jr., et al., 2013) is: 26.3%   Plan: 1. Continue current meds - Rosuvastatin 5mg  daily - renew 2. Continue ASA 81mg  for primary ASCVD risk reduction 3. Encourage improved lifestyle - low carb/cholesterol, reduce portion size, continue improving regular exercise  Labs ordered for next visit

## 2020-08-27 NOTE — Progress Notes (Signed)
Subjective:    Patient ID: Joanna Morris, female    DOB: 1950-11-18, 70 y.o.   MRN: [id number]  Joanna Morris is a 70 y.o. female presenting on 08/27/2020 for Hypertension and Hyperlipidemia   HPI  HYPERLIPIDEMIA: - Reports no concerns. Last lipid panel 2020, controlled - Currently taking Rosuvastatin 5mg , tolerating well without side effects or myalgias   Chronic Hepatitis C with Fibrosis On Anti Viral Hep C therapy per GI.   CHRONIC HTN: Not checking BP regularly. Had improved, now today she smoked cig before coming in and admits some elevated BP Current Meds - HCTZ 12.5mg  daily Did NOT take meds today. Tolerating well, w/o complaints. Denies CP, dyspnea, HA, edema, dizziness / lightheadedness     PMH Chronic R knee pain, s/p R arthroscopic partial medial meniscectomy   Health Maintenance:   UTD COVID19 vaccine     FIT - 12/2018 negative. Consider Cologuard   Depression screen [facility] 2/9 03/23/2020 06/23/2019 03/18/2019  Decreased Interest 0 0 0  Down, Depressed, Hopeless 0 0 0  PHQ - 2 Score 0 0 0    Social History   Tobacco Use   Smoking status: Every Day    Packs/day: 0.50    Years: 51.00    Pack years: 25.50    Types: Cigarettes   Smokeless tobacco: Never   Tobacco comments:    Quit once for 2 years. pt using nicorette gum   Vaping Use   Vaping Use: Never used  Substance Use Topics   Alcohol use: Yes    Comment: half a pint of liquor every week   Drug use: No    Review of Systems Per HPI unless specifically indicated above     Objective:    BP (!) 158/92   Pulse 73   Ht 5' (1.524 m)   Wt 182 lb (82.6 kg)   SpO2 94%   BMI 35.54 kg/m   Wt Readings from Last 3 Encounters:  08/27/20 182 lb (82.6 kg)  03/23/20 189 lb (85.7 kg)  08/08/19 187 lb 12.8 oz (85.2 kg)    Physical Exam Vitals and nursing note reviewed.  Constitutional:      General: She is not in acute distress.    Appearance: Normal appearance. She is well-developed. She is  not diaphoretic.     Comments: Well-appearing, comfortable, cooperative  HENT:     Head: Normocephalic and atraumatic.  Eyes:     General:        Right eye: No discharge.        Left eye: No discharge.     Conjunctiva/sclera: Conjunctivae normal.  Cardiovascular:     Rate and Rhythm: Normal rate.  Pulmonary:     Effort: Pulmonary effort is normal.  Skin:    General: Skin is warm and dry.     Findings: No erythema or rash.  Neurological:     Mental Status: She is alert and oriented to person, place, and time.  Psychiatric:        Mood and Affect: Mood normal.        Behavior: Behavior normal.        Thought Content: Thought content normal.     Comments: Well groomed, good eye contact, normal speech and thoughts   Results for orders placed or performed in visit on 02/10/20  Comprehensive metabolic panel  Result Value Ref Range   Sodium 139 135 - 145 mmol/L   Potassium 3.6 3.5 - 5.1 mmol/L   Chloride  98 98 - 111 mmol/L   CO2 29 22 - 32 mmol/L   Glucose, Bld 103 (H) 70 - 99 mg/dL   BUN 13 8 - 23 mg/dL   Creatinine, Ser 0.82 0.44 - 1.00 mg/dL   Calcium 9.1 8.9 - 10.3 mg/dL   Total Protein 7.2 6.5 - 8.1 g/dL   Albumin 3.5 3.5 - 5.0 g/dL   AST 85 (H) 15 - 41 U/L   ALT 75 (H) 0 - 44 U/L   Alkaline Phosphatase 58 38 - 126 U/L   Total Bilirubin 0.8 0.3 - 1.2 mg/dL   GFR, Estimated >60 >60 mL/min   Anion gap 12 5 - 15  CBC with Differential  Result Value Ref Range   WBC 4.1 4.0 - 10.5 K/uL   RBC 4.85 3.87 - 5.11 MIL/uL   Hemoglobin 14.5 12.0 - 15.0 g/dL   HCT 43.2 36.0 - 46.0 %   MCV 89.1 80.0 - 100.0 fL   MCH 29.9 26.0 - 34.0 pg   MCHC 33.6 30.0 - 36.0 g/dL   RDW 12.1 11.5 - 15.5 %   Platelets 136 (L) 150 - 400 K/uL   nRBC 0.0 0.0 - 0.2 %   Neutrophils Relative % 41 %   Neutro Abs 1.7 1.7 - 7.7 K/uL   Lymphocytes Relative 48 %   Lymphs Abs 2.0 0.7 - 4.0 K/uL   Monocytes Relative 9 %   Monocytes Absolute 0.4 0.1 - 1.0 K/uL   Eosinophils Relative 1 %   Eosinophils  Absolute 0.1 0.0 - 0.5 K/uL   Basophils Relative 1 %   Basophils Absolute 0.0 0.0 - 0.1 K/uL   Immature Granulocytes 0 %   Abs Immature Granulocytes 0.01 0.00 - 0.07 K/uL      Assessment & Plan:   Problem List Items Addressed This Visit     Essential hypertension    Elevated BP today need to refill meds Admits smoking today raised BP - Home BP readings reported normal No known complications  Plan:  1. Continue current BP regimen - HCTZ 12.5mg  daily  refill 2. Encourage improved lifestyle - low sodium diet, regular exercise - need to return to ortho for knee 3. Continue monitor BP outside office, bring readings to next visit, if persistently >140/90 or new symptoms notify office sooner       Relevant Medications   hydrochlorothiazide (MICROZIDE) 12.5 MG capsule   rosuvastatin (CRESTOR) 5 MG tablet   Elevated LDL cholesterol level    Controlled cholesterol on statin / lifestyle The 10-year ASCVD risk score Mikey Bussing DC Jr., et al., 2013) is: 26.3%   Plan: 1. Continue current meds - Rosuvastatin 5mg  daily - renew 2. Continue ASA 81mg  for primary ASCVD risk reduction 3. Encourage improved lifestyle - low carb/cholesterol, reduce portion size, continue improving regular exercise  Labs ordered for next visit       Relevant Medications   rosuvastatin (CRESTOR) 5 MG tablet    Meds ordered this encounter  Medications   hydrochlorothiazide (MICROZIDE) 12.5 MG capsule    Sig: Take 1 capsule (12.5 mg total) by mouth daily.    Dispense:  90 capsule    Refill:  3   rosuvastatin (CRESTOR) 5 MG tablet    Sig: Take 1 tablet (5 mg total) by mouth at bedtime.    Dispense:  90 tablet    Refill:  3      Follow up plan: Return in about 3 months (around 11/27/2020) for 3 month fasting lab  only then 1 week later Annual Physical.  Future labs ordered for 12/2020   Nobie Putnam, [facility] 08/27/2020, 8:57 AM

## 2020-08-27 NOTE — Patient Instructions (Addendum)
Thank you for coming to the office today.  Refills added today  Colon Cancer Screening: - For all adults age 72+ routine colon cancer screening is highly recommended.     - Recent guidelines from [place] recommend starting age of 34 - Early detection of colon cancer is important, because often there are no warning signs or symptoms, also if found early usually it can be cured. Late stage is hard to treat.  - If you are not interested in Colonoscopy screening (if done and normal you could be cleared for 5 to 10 years until next due), then Cologuard is an excellent alternative for screening test for Colon Cancer. It is highly sensitive for detecting DNA of colon cancer from even the earliest stages. Also, there is NO bowel prep required. - If Cologuard is NEGATIVE, then it is good for 3 years before next due - If Cologuard is POSITIVE, then it is strongly advised to get a Colonoscopy, which allows the GI doctor to locate the source of the cancer or polyp (even very early stage) and treat it by removing it. ------------------------- If you would like to proceed with Cologuard (stool DNA test) - FIRST, call your insurance company and tell them you want to check cost of Cologuard tell them CPT Code [phone] (it may be completely covered and you could get for no cost, OR max cost without any coverage is about $600). Also, keep in mind if you do NOT open the kit, and decide not to do the test, you will NOT be charged, you should contact the company if you decide not to do the test. - If you want to proceed, you can notify us (phone message, [place], or at next visit) and we will order it for you. The test kit will be delivered to you house within about 1 week. Follow instructions to collect sample, you may call the company for any help or questions, 24/7 telephone support at [phone].  DUE for FASTING BLOOD WORK (no food or drink after midnight before the lab appointment, only  water or coffee without cream/sugar on the morning of)  SCHEDULE "Lab Only" visit in the morning at the clinic for lab draw in 3 MONTHS   - Make sure Lab Only appointment is at about 1 week before your next appointment, so that results will be available  For Lab Results, once available within 2-3 days of blood draw, you can can log in to MyChart online to view your results and a brief explanation. Also, we can discuss results at next follow-up visit.    Please schedule a Follow-up Appointment to: Return in about 3 months (around 11/27/2020) for 3 month fasting lab only then 1 week later Annual Physical.  If you have any other questions or concerns, please feel free to call the office or send a message through [place]. You may also schedule an earlier appointment if necessary.  Additionally, you may be receiving a survey about your experience at our office within a few days to 1 week by e-mail or mail. We value your feedback.  Nobie Putnam, DO [place]

## 2020-08-30 ENCOUNTER — Telehealth: Payer: Self-pay | Admitting: Oncology

## 2020-08-30 NOTE — Telephone Encounter (Signed)
Spoke with patient about the need to reschedule her missed CT on 6/21. Patient would like to reschedule the scan and also her f/u MD appt to the morning. Appts rescheduled. Sending updated info in the mail.

## 2020-09-09 ENCOUNTER — Ambulatory Visit: Payer: Medicare Other | Admitting: Oncology

## 2020-09-14 ENCOUNTER — Ambulatory Visit
Admission: RE | Admit: 2020-09-14 | Discharge: 2020-09-14 | Disposition: A | Discharge status: Home / Self Care | Payer: Medicare Other | Source: Ambulatory Visit | Attending: Oncology | Admitting: Oncology

## 2020-09-14 ENCOUNTER — Other Ambulatory Visit: Payer: Self-pay

## 2020-09-14 DIAGNOSIS — C3491 Malignant neoplasm of unspecified part of right bronchus or lung: Secondary | ICD-10-CM | POA: Diagnosis not present

## 2020-09-14 DIAGNOSIS — R911 Solitary pulmonary nodule: Secondary | ICD-10-CM | POA: Diagnosis not present

## 2020-09-14 DIAGNOSIS — I700 Atherosclerosis of aorta: Secondary | ICD-10-CM | POA: Diagnosis not present

## 2020-09-14 DIAGNOSIS — C3490 Malignant neoplasm of unspecified part of unspecified bronchus or lung: Secondary | ICD-10-CM | POA: Diagnosis not present

## 2020-09-23 ENCOUNTER — Encounter: Payer: Self-pay | Admitting: Oncology

## 2020-09-23 ENCOUNTER — Inpatient Hospital Stay: Payer: Medicare Other | Admitting: Oncology

## 2020-09-23 VITALS — BP 155/100 | HR 87 | Temp 98.4°F | Resp 17 | Wt 178.8 lb

## 2020-09-23 NOTE — Progress Notes (Unsigned)
Here for results of her CT scans. Feels well. No concerns.

## 2020-09-27 ENCOUNTER — Encounter: Payer: Self-pay | Admitting: Hospice and Palliative Medicine

## 2020-09-27 ENCOUNTER — Inpatient Hospital Stay: Payer: Medicare Other | Attending: Oncology | Admitting: Hospice and Palliative Medicine

## 2020-09-27 ENCOUNTER — Other Ambulatory Visit: Payer: Self-pay

## 2020-09-27 VITALS — BP 164/76 | HR 66 | Resp 18 | Wt 179.6 lb

## 2020-09-27 DIAGNOSIS — Z902 Acquired absence of lung [part of]: Secondary | ICD-10-CM | POA: Insufficient documentation

## 2020-09-27 DIAGNOSIS — C3491 Malignant neoplasm of unspecified part of right bronchus or lung: Secondary | ICD-10-CM | POA: Diagnosis not present

## 2020-09-27 DIAGNOSIS — F17210 Nicotine dependence, cigarettes, uncomplicated: Secondary | ICD-10-CM | POA: Insufficient documentation

## 2020-09-27 DIAGNOSIS — C3411 Malignant neoplasm of upper lobe, right bronchus or lung: Secondary | ICD-10-CM | POA: Insufficient documentation

## 2020-09-27 NOTE — Progress Notes (Signed)
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  She has a longstanding history of smoking and underwent CT lung cancer screening which showed a 22.4 mm irregular nodule in the right upper lobe.  This was followed by a PET CT scan which showed a spiculated 2.7 cm right upper lobe lung mass with an SUV of 26.8 consistent with primary lung cancer.  There was no evidence of hypermetabolic mediastinal or hilar adenopathy.  No evidence of metastatic disease elsewhere.  Patient referred to [place] for further recommendations.  Overall she is doing well.  She lives alone and is independent of her ADLs and IADLs.  She is also had her PFTs done which have been normal.  Reports that her appetite is good and she denies any unintentional weight loss   Patient was seen by Dr. Genevive Bi and underwent right upper lobectomy on 04/02/2018. Final pathology showed 2.5 cm adenocarcinoma, micropapillary predominant with visceral pleural invasion.  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  Benign 0.2 mm nodule of ossified cartilage.  1 lobar and 8 other lymph nodes were examined and were negative for malignancy.  Lymphovascular invasion not identified.  Margins uninvolved.  pT2A pN0.  After risks and benefits discussion patient chose not to proceed with adjuvant chemotherapy  Interval history-patient  reports doing well.  She denies any significant changes or concerns.  No symptomatic complaints today.  She denies any changes since last seen.  No shortness of breath, chest pain, or cough.  No weight loss or changes in performance status.  ECOG PS- 1 Pain scale- 0 Opioid associated constipation- no  Review of systems- Review of Systems  Constitutional:  Negative for chills, fever, malaise/fatigue and weight loss.  HENT:  Negative for congestion, ear discharge and nosebleeds.   Eyes:  Negative for blurred vision.  Respiratory:  Negative for cough, hemoptysis, sputum production, shortness of breath and wheezing.   Cardiovascular:  Negative for chest pain, palpitations, orthopnea and claudication.  Gastrointestinal:  Negative for abdominal pain, blood in stool, constipation, diarrhea, heartburn, melena, nausea and vomiting.  Genitourinary:  Negative for dysuria, flank pain, frequency, hematuria and urgency.  Musculoskeletal:  Negative for back pain, joint pain and myalgias.  Skin:  Negative for rash.  Neurological:  Negative for dizziness, tingling, focal weakness, seizures, weakness and headaches.  Endo/Heme/Allergies:  Does not bruise/bleed easily.  Psychiatric/Behavioral:  Negative for depression and suicidal ideas. The patient does not have insomnia.      Allergies  Allergen Reactions   Tramadol Rash     Past Medical History:  Diagnosis Date   Allergy    Cataract    Genital warts    GERD (gastroesophageal reflux disease)    H/O   History of kidney stones    Hyperlipidemia    Hypertension    Lung nodule    Primary cancer of right lung (HCC)    Tobacco dependency      Past Surgical History:  Procedure Laterality Date   ABDOMINAL HYSTERECTOMY     CESAREAN SECTION     X2   KNEE ARTHROSCOPY WITH MEDIAL MENISECTOMY Right 05/22/2016   Procedure: KNEE ARTHROSCOPY WITH PARTIAL MEDIAL MENISECTOMY;  Surgeon: Dereck Leep, MD;  Location: [facility] ORS;  Service: Orthopedics;   Laterality: Right;   OOPHORECTOMY     THORACOTOMY Right 04/02/2018   Procedure: THORACOTOMY AND LUNG RESECTION WITH PREOP BRONCH;  Surgeon: Nestor Lewandowsky, MD;  Location: [facility] ORS;  Service: General;  Laterality: Right;    Social History   Socioeconomic History   Marital status: Divorced    Spouse name: Not on file   Number of children: 2   Years of education: High School   Highest education level: Not on file  Occupational History   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 pt using nicorette gum   Vaping Use   Vaping Use: Never used  Substance and Sexual Activity   Alcohol use: Yes    Comment: half a pint of liquor every week   Drug use: No   Sexual activity: Not Currently    Birth control/protection: Surgical  Other Topics Concern   Not on file  Social History Narrative   Lives at home by herself, independent at baseline   Social Determinants of Health   Financial Resource Strain: Low Risk    Difficulty of Paying Living Expenses: Not hard at all  Food Insecurity: No Food Insecurity   Worried About Charity fundraiser in the Last Year: Never true   [place] in the Last Year: Never true  Transportation Needs: No Transportation Needs   Lack of Transportation (Medical): No   Lack of Transportation (Non-Medical): No  Physical Activity: Sufficiently Active   Days of Exercise per Week: 5 days   Minutes of Exercise per Session: 60 min  Stress: No Stress Concern Present   Feeling of Stress : Not at all  Social Connections: Not on file  Intimate Partner Violence: Not on file    Family History  Problem Relation Age of Onset   Heart disease Mother 46       MI   Heart attack Mother 46   Diabetes Mother    Pancreatic cancer Sister    Ovarian cancer  Sister    Breast cancer Neg Hx      Current Outpatient Medications:    aspirin EC 81 MG tablet, Take 1 tablet (81 mg total) by mouth daily., Disp: , Rfl:    Calcium Carbonate-Vitamin D (CALCIUM 600/VITAMIN D PO), Take 1 tablet by mouth daily., Disp: , Rfl:    hydrochlorothiazide (MICROZIDE) 12.5 MG capsule, Take 1 capsule (12.5 mg total) by mouth daily., Disp: 90 capsule, Rfl: 3   MAVYRET 100-40 MG TABS, Take 3 tablets by mouth daily., Disp: , Rfl:    rosuvastatin (CRESTOR) 5 MG tablet, Take 1 tablet (5 mg total) by mouth at bedtime., Disp: 90 tablet, Rfl: 3  Physical exam:  Physical Exam Constitutional:      General: She is not in acute distress. Cardiovascular:     Rate and Rhythm: Normal rate and regular rhythm.     Heart sounds:  Normal heart sounds.  Pulmonary:     Effort: Pulmonary effort is normal.     Breath sounds: Normal breath sounds.  Abdominal:     General: Bowel sounds are normal.     Palpations: Abdomen is soft.  Skin:    General: Skin is warm and dry.  Neurological:     Mental Status: She is alert and oriented to person, place, and time.     CMP Latest Ref Rng & Units 02/10/2020  Glucose 70 - 99 mg/dL 103(H)  BUN 8 - 23 mg/dL 13  Creatinine 0.44 - 1.00 mg/dL 0.82  Sodium 135 - 145 mmol/L 139  Potassium 3.5 - 5.1 mmol/L 3.6  Chloride 98 - 111 mmol/L 98  CO2 22 - 32 mmol/L 29  Calcium 8.9 - 10.3 mg/dL 9.1  Total Protein 6.5 - 8.1 g/dL 7.2  Total Bilirubin 0.3 - 1.2 mg/dL 0.8  Alkaline Phos 38 - 126 U/L 58  AST 15 - 41 U/L 85(H)  ALT 0 - 44 U/L 75(H)   CBC Latest Ref Rng & Units 02/10/2020  WBC 4.0 - 10.5 K/uL 4.1  Hemoglobin 12.0 - 15.0 g/dL 14.5  Hematocrit 36.0 - 46.0 % 43.2  Platelets 150 - 400 K/uL 136(L)    No images are attached to the encounter.  CT Chest Wo Contrast  Result Date: 09/15/2020 CLINICAL DATA:  Restaging of RIGHT-sided lung cancer in a 70 year old female. EXAM: CT CHEST WITHOUT CONTRAST TECHNIQUE: Multidetector CT imaging of the  chest was performed following the standard protocol without IV contrast. COMPARISON:  February 24, 2020. FINDINGS: Cardiovascular: Heart size is normal. No pericardial effusion. Central pulmonary vasculature with stable appearance, normal caliber. Scattered atherosclerotic changes of the thoracic aorta without aneurysm. Limited assessment of cardiovascular structures given lack of intravenous contrast. Mediastinum/Nodes: Small low-attenuation focus in the RIGHT thyroid (image 10/2) 18 mm. No axillary lymphadenopathy. No internal mammary lymphadenopathy. No mediastinal or gross hilar lymphadenopathy. RIGHT cardiophrenic lymph node (image 96/2) 13 mm short axis, approximately 13 mm on August 07, 2019. Patulous esophagus. No change in this appearance. Lungs/Pleura: Partial lung resection changes in the periphery of the RIGHT lung following RIGHT upper lobectomy. Suture lines about the RIGHT hilum with similar appearance. Mild scarring in the peripheral RIGHT chest. Small juxta hilar pulmonary nodule (image 73/5) measuring approximately 4-5 mm in the coronal plane is unchanged compared to the study of August 07, 2019. No consolidation or sign of pleural effusion. Upper Abdomen: Stable appearance of low-density hepatic cysts. Lobular hepatic contours. Incidental imaging of upper abdominal contents with imaged portions of gallbladder, pancreas, spleen and adrenal glands without acute process or significant finding. No perinephric stranding about the upper kidneys. No acute gastrointestinal process to the extent evaluated. Musculoskeletal: No acute musculoskeletal findings. No destructive bone process. IMPRESSION: 1. Stable postoperative changes in the RIGHT chest. No definite evidence of recurrent or metastatic disease. 2. Small LEFT upper lobe pulmonary nodule not changed since June of 2021,, not definitely seen in December of 2021 though there was some motion in this area. Suggest attention on follow-up. 3. RIGHT thyroid  nodule 1.8 cm Recommend thyroid US (ref: J Am Coll Radiol. 2015 Feb;12(2): 143-50). 4. Aortic atherosclerosis. Aortic Atherosclerosis (ICD10-I70.0). Electronically Signed   By: Zetta Bills M.D.   On: 09/15/2020 12:49      Assessment and plan- Patient is a 69 y.o. 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    She is  here for routine follow-up of lung cancer  CT of the chest on 09/14/2020 was stable without evidence of recurrence or metastatic disease.  There was a small left upper lobe pulmonary nodule but this has been unchanged since June 2021.  Patient is asymptomatic and appears to be doing well clinically.  Discussed with Dr. Janese Banks and will plan to repeat CT in 6 months with MD follow-up.   Visit Diagnosis 1. Primary adenocarcinoma of right lung ([place])      Altha Harm, PhD, NP-C [facility] at [facility] [phone] 09/27/2020 8:31 AM

## 2020-12-10 ENCOUNTER — Other Ambulatory Visit: Payer: Self-pay | Admitting: Family Medicine

## 2020-12-10 DIAGNOSIS — Z1231 Encounter for screening mammogram for malignant neoplasm of breast: Secondary | ICD-10-CM

## 2020-12-13 ENCOUNTER — Other Ambulatory Visit: Payer: Self-pay

## 2020-12-13 DIAGNOSIS — E7800 Pure hypercholesterolemia, unspecified: Secondary | ICD-10-CM

## 2020-12-13 DIAGNOSIS — R7309 Other abnormal glucose: Secondary | ICD-10-CM

## 2020-12-13 DIAGNOSIS — Z0000 Encounter for general adult medical examination without abnormal findings: Secondary | ICD-10-CM

## 2020-12-13 DIAGNOSIS — I10 Essential (primary) hypertension: Secondary | ICD-10-CM

## 2020-12-13 DIAGNOSIS — E669 Obesity, unspecified: Secondary | ICD-10-CM

## 2020-12-14 ENCOUNTER — Other Ambulatory Visit: Payer: Self-pay

## 2020-12-14 ENCOUNTER — Other Ambulatory Visit: Payer: Medicare Other

## 2020-12-14 DIAGNOSIS — Z0000 Encounter for general adult medical examination without abnormal findings: Secondary | ICD-10-CM | POA: Diagnosis not present

## 2020-12-14 DIAGNOSIS — R7309 Other abnormal glucose: Secondary | ICD-10-CM | POA: Diagnosis not present

## 2020-12-14 DIAGNOSIS — I10 Essential (primary) hypertension: Secondary | ICD-10-CM | POA: Diagnosis not present

## 2020-12-14 DIAGNOSIS — E7800 Pure hypercholesterolemia, unspecified: Secondary | ICD-10-CM | POA: Diagnosis not present

## 2020-12-15 LAB — TSH: TSH: 1.19 mIU/L (ref 0.40–4.50)

## 2020-12-15 LAB — COMPLETE METABOLIC PANEL WITH GFR
AG Ratio: 1.1 (calc) (ref 1.0–2.5)
ALT: 80 U/L — ABNORMAL HIGH (ref 6–29)
AST: 97 U/L — ABNORMAL HIGH (ref 10–35)
Albumin: 3.7 g/dL (ref 3.6–5.1)
Alkaline phosphatase (APISO): 66 U/L (ref 37–153)
BUN: 15 mg/dL (ref 7–25)
CO2: 31 mmol/L (ref 20–32)
Calcium: 9.3 mg/dL (ref 8.6–10.4)
Chloride: 102 mmol/L (ref 98–110)
Creat: 0.77 mg/dL (ref 0.60–1.00)
Globulin: 3.4 g/dL (calc) (ref 1.9–3.7)
Glucose, Bld: 98 mg/dL (ref 65–99)
Potassium: 4.3 mmol/L (ref 3.5–5.3)
Sodium: 140 mmol/L (ref 135–146)
Total Bilirubin: 0.7 mg/dL (ref 0.2–1.2)
Total Protein: 7.1 g/dL (ref 6.1–8.1)
eGFR: 82 mL/min/{1.73_m2} (ref >=60)

## 2020-12-15 LAB — CBC WITH DIFFERENTIAL/PLATELET
Absolute Monocytes: 374 cells/uL (ref 200–950)
Basophils Absolute: 19 cells/uL (ref 0–200)
Basophils Relative: 0.5 %
Eosinophils Absolute: 52 cells/uL (ref 15–500)
Eosinophils Relative: 1.4 %
HCT: 44.2 % (ref 35.0–45.0)
Hemoglobin: 14.9 g/dL (ref 11.7–15.5)
Lymphs Abs: 1924 cells/uL (ref 850–3900)
MCH: 30.7 pg (ref 27.0–33.0)
MCHC: 33.7 g/dL (ref 32.0–36.0)
MCV: 91.1 fL (ref 80.0–100.0)
MPV: 11.2 fL (ref 7.5–12.5)
Monocytes Relative: 10.1 %
Neutro Abs: 1332 cells/uL — ABNORMAL LOW (ref 1500–7800)
Neutrophils Relative %: 36 %
Platelets: 107 10*3/uL — ABNORMAL LOW (ref 140–400)
RBC: 4.85 10*6/uL (ref 3.80–5.10)
RDW: 11.9 % (ref 11.0–15.0)
Total Lymphocyte: 52 %
WBC: 3.7 10*3/uL — ABNORMAL LOW (ref 3.8–10.8)

## 2020-12-15 LAB — LIPID PANEL
Cholesterol: 174 mg/dL (ref <200)
HDL: 68 mg/dL (ref >=50)
LDL Cholesterol (Calc): 92 mg/dL (calc)
Non-HDL Cholesterol (Calc): 106 mg/dL (calc) (ref <130)
Total CHOL/HDL Ratio: 2.6 (calc) (ref <5.0)
Triglycerides: 62 mg/dL (ref <150)

## 2020-12-15 LAB — HEMOGLOBIN A1C
Hgb A1c MFr Bld: 5.3 % of total Hgb (ref <5.7)
Mean Plasma Glucose: 105 mg/dL
eAG (mmol/L): 5.8 mmol/L

## 2020-12-21 ENCOUNTER — Ambulatory Visit (INDEPENDENT_AMBULATORY_CARE_PROVIDER_SITE_OTHER): Payer: Medicare Other | Admitting: Family Medicine

## 2020-12-21 ENCOUNTER — Encounter: Payer: Self-pay | Admitting: Family Medicine

## 2020-12-21 ENCOUNTER — Other Ambulatory Visit: Payer: Self-pay

## 2020-12-21 VITALS — BP 154/90 | HR 72 | Ht 60.0 in | Wt 184.6 lb

## 2020-12-21 DIAGNOSIS — Z23 Encounter for immunization: Secondary | ICD-10-CM | POA: Diagnosis not present

## 2020-12-21 DIAGNOSIS — C3491 Malignant neoplasm of unspecified part of right bronchus or lung: Secondary | ICD-10-CM | POA: Diagnosis not present

## 2020-12-21 DIAGNOSIS — Z0000 Encounter for general adult medical examination without abnormal findings: Secondary | ICD-10-CM | POA: Diagnosis not present

## 2020-12-21 DIAGNOSIS — R7309 Other abnormal glucose: Secondary | ICD-10-CM | POA: Diagnosis not present

## 2020-12-21 DIAGNOSIS — D696 Thrombocytopenia, unspecified: Secondary | ICD-10-CM | POA: Insufficient documentation

## 2020-12-21 DIAGNOSIS — B182 Chronic viral hepatitis C: Secondary | ICD-10-CM | POA: Diagnosis not present

## 2020-12-21 DIAGNOSIS — I10 Essential (primary) hypertension: Secondary | ICD-10-CM

## 2020-12-21 NOTE — Patient Instructions (Addendum)
Thank you for coming to the office today.  Keep a close watch on your blood pressure, if elevated >140 consistently then we can double your dose of HCTZ 12.5mg  up to 25mg   Flu Shot today  Consider new COVID-19 booster at pharmacy  Cholesterol looks great, keep on medicine  Recent Labs    12/14/20 0834  HGBA1C 5.3   Blood sugar is perfect!  Please schedule a Follow-up Appointment to: Return in about 6 months (around 06/21/2021) for 6 month follow-up PreDM A1c, HTN, Onc/updates.  If you have any other questions or concerns, please feel free to call the office or send a message through [facility]. You may also schedule an earlier appointment if necessary.  Additionally, you may be receiving a survey about your experience at our office within a few days to 1 week by e-mail or mail. We value your feedback.  Nobie Putnam, DO [place]

## 2020-12-21 NOTE — Assessment & Plan Note (Signed)
Last lab CBC low PLT >100 No symptoms or bleeding On ASA Followed by Heme

## 2020-12-21 NOTE — Assessment & Plan Note (Signed)
Elevated BP today repeat manual improved, admits ate sausage salty and smoked before apt - Home BP readings reported normal No known complications  Plan:  1. Continue current BP regimen - HCTZ 12.5mg  daily - discussed dose inc to 25mg  in future if elevated BP 2. Encourage improved lifestyle - low sodium diet, regular exercise - need to return to ortho for knee 3. Continue monitor BP outside office, bring readings to next visit, if persistently >140/90 or new symptoms notify office sooner

## 2020-12-21 NOTE — Assessment & Plan Note (Signed)
S/p lobectomy primary R adenocarcinoma On surveillance per Oncology [facility] CC CT imaging 09/2020, next 6 months

## 2020-12-21 NOTE — Progress Notes (Signed)
Subjective:    Patient ID: Joanna Morris, female    DOB: [birth year], 70 y.o.   MRN: [id number]  Joanna Morris is a 70 y.o. female presenting on 12/21/2020 for Annual Exam   HPI  Here for Annual Physical and Lab Review.  Follow-up RUL Primary Adenocarcinoma Followed by [facility] Oncology  s/p RUL lobectomy, with curative intent. She had negative lymph nodes for malignancy, opted for surveillance and monitoring without adjuvant chemotherapy at that time in 04/2018. - She will continue surveillance every 6 months, last CT done 09/2020  HYPERLIPIDEMIA: - Reports no concerns. Last lipid panel 12/2020, controlled - Currently taking Rosuvastatin 25m, tolerating well without side effects or myalgias   Chronic Hepatitis C with Fibrosis On Anti Viral Hep C therapy per GI.   CHRONIC HTN: Not checking BP regularly. Had improved, now today she smoked cig before coming in and admits some elevated BP Current Meds - HCTZ 12.524mdaily Did NOT take meds today. Tolerating well, w/o complaints. Denies CP, dyspnea, HA, edema, dizziness / lightheadedness   ELEVATED A1c Last A1c 5.3 (12/2020), improved from prior 5.6 to 6.2 in past Has family history mother DM Lifestyle: - Diet (major improvement, limited sweets now, overall better diet) - Exercise (Still walking regularly)   PMH Chronic R knee pain, s/p R arthroscopic partial medial meniscectomy   Health Maintenance:  Consider Shingrix  Due for Flu Shot, will receive today  Considering COVID updated booster.  Mammogram upcoming on 01/19/21  She has FOBT from insurance to complete at home.   [facility] 12/21/2020 03/23/2020 06/23/2019  Decreased Interest 0 0 0  Down, Depressed, Hopeless 0 0 0  PHQ - 2 Score 0 0 0  Altered sleeping 0 - -  Tired, decreased energy 0 - -  Change in appetite 0 - -  Feeling bad or failure about yourself  0 - -  Trouble concentrating 0 - -  Moving slowly or fidgety/restless 0 - -   Suicidal thoughts 0 - -  PHQ-9 Score 0 - -  Difficult doing work/chores Not difficult at all - -    Past Medical History:  Diagnosis Date   Allergy    Cataract    Genital warts    GERD (gastroesophageal reflux disease)    H/O   History of kidney stones    Hyperlipidemia    Hypertension    Lung nodule    Primary cancer of right lung (HCC)    Tobacco dependency    Past Surgical History:  Procedure Laterality Date   ABDOMINAL HYSTERECTOMY     CESAREAN SECTION     X2   KNEE ARTHROSCOPY WITH MEDIAL MENISECTOMY Right 05/22/2016   Procedure: KNEE ARTHROSCOPY WITH PARTIAL MEDIAL MENISECTOMY;  Surgeon: JaDereck LeepMD;  Location: [facility] ORS;  Service: Orthopedics;  Laterality: Right;   OOPHORECTOMY     THORACOTOMY Right 04/02/2018   Procedure: THORACOTOMY AND LUNG RESECTION WITH PREOP BRONCH;  Surgeon: OaNestor LewandowskyMD;  Location: [facility] ORS;  Service: General;  Laterality: Right;   Social History   Socioeconomic History   Marital status: Divorced    Spouse name: Not on file   Number of children: 2   Years of education: High School   Highest education level: Not on file  Occupational History   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 pt using nicorette gum   Vaping Use   Vaping Use: Never used  Substance and Sexual Activity   Alcohol use: Yes    Comment: half a pint of liquor every week   Drug use: No   Sexual activity: Not Currently    Birth control/protection: Surgical  Other Topics Concern   Not on file  Social History Narrative   Lives at home by herself, independent at baseline   Social Determinants of Health   Financial Resource Strain: Low Risk    Difficulty of Paying Living Expenses: Not hard at all  Food Insecurity: No Food Insecurity    Worried About Charity fundraiser in the Last Year: Never true   [place] in the Last Year: Never true  Transportation Needs: No Transportation Needs   Lack of Transportation (Medical): No   Lack of Transportation (Non-Medical): No  Physical Activity: Sufficiently Active   Days of Exercise per Week: 5 days   Minutes of Exercise per Session: 60 min  Stress: No Stress Concern Present   Feeling of Stress : Not at all  Social Connections: Not on file  Intimate Partner Violence: Not on file   Family History  Problem Relation Age of Onset   Heart disease Mother 43       MI   Heart attack Mother 10   Diabetes Mother    Pancreatic cancer Sister    Ovarian cancer Sister    Breast cancer Neg Hx    Current Outpatient Medications on File Prior to Visit  Medication Sig   aspirin EC 81 MG tablet Take 1 tablet (81 mg total) by mouth daily.   Calcium Carbonate-Vitamin D (CALCIUM 600/VITAMIN D PO) Take 1 tablet by mouth daily.   hydrochlorothiazide (MICROZIDE) 12.5 MG capsule Take 1 capsule (12.5 mg total) by mouth daily.   MAVYRET 100-40 MG TABS Take 3 tablets by mouth daily.   rosuvastatin (CRESTOR) 5 MG tablet Take 1 tablet (5 mg total) by mouth at bedtime.   No current facility-administered medications on file prior to visit.    Review of Systems  Constitutional:  Negative for activity change, appetite change, chills, diaphoresis, fatigue and fever.  HENT:  Negative for congestion and hearing loss.   Eyes:  Negative for visual disturbance.  Respiratory:  Negative for cough, chest tightness, shortness of breath and wheezing.   Cardiovascular:  Negative for chest pain, palpitations and leg swelling.  Gastrointestinal:  Negative for abdominal pain, constipation, diarrhea, nausea and vomiting.  Genitourinary:  Negative for dysuria, frequency and hematuria.  Musculoskeletal:  Negative for arthralgias and neck pain.  Skin:  Negative for rash.  Neurological:  Negative for dizziness,  weakness, light-headedness, numbness and headaches.  Hematological:  Negative for adenopathy.  Psychiatric/Behavioral:  Negative for behavioral problems, dysphoric mood and sleep disturbance.   Per HPI unless specifically indicated above      Objective:    BP (!) 154/90 (BP Location: Left Arm, Cuff Size: Normal)   Pulse 72   Ht 5' (1.524 m)   Wt 184 lb 9.6 oz (83.7 kg)   SpO2 98%   BMI 36.05 kg/m   Wt Readings from Last 3 Encounters:  12/21/20 184 lb 9.6 oz (83.7 kg)  09/27/20 179 lb 9.6 oz (81.5 kg)  09/23/20 178 lb 12.8 oz (  81.1 kg)    Physical Exam Vitals and nursing note reviewed.  Constitutional:      General: She is not in acute distress.    Appearance: She is well-developed. She is not diaphoretic.     Comments: Well-appearing, comfortable, cooperative  HENT:     Head: Normocephalic and atraumatic.  Eyes:     General:        Right eye: No discharge.        Left eye: No discharge.     Conjunctiva/sclera: Conjunctivae normal.     Pupils: Pupils are equal, round, and reactive to light.  Neck:     Thyroid: No thyromegaly.  Cardiovascular:     Rate and Rhythm: Normal rate and regular rhythm.     Pulses: Normal pulses.     Heart sounds: Normal heart sounds. No murmur heard. Pulmonary:     Effort: Pulmonary effort is normal. No respiratory distress.     Breath sounds: Normal breath sounds. No wheezing or rales.  Abdominal:     General: Bowel sounds are normal. There is no distension.     Palpations: Abdomen is soft. There is no mass.     Tenderness: There is no abdominal tenderness.  Musculoskeletal:        General: No tenderness. Normal range of motion.     Cervical back: Normal range of motion and neck supple.     Right lower leg: No edema.     Left lower leg: No edema.     Comments: Upper / Lower Extremities: - Normal muscle tone, strength bilateral upper extremities 5/5, lower extremities 5/5  Lymphadenopathy:     Cervical: No cervical adenopathy.  Skin:     General: Skin is warm and dry.     Findings: No erythema or rash.  Neurological:     Mental Status: She is alert and oriented to person, place, and time.     Comments: Distal sensation intact to light touch all extremities  Psychiatric:        Mood and Affect: Mood normal.        Behavior: Behavior normal.        Thought Content: Thought content normal.     Comments: Well groomed, good eye contact, normal speech and thoughts    I have personally reviewed the radiology report from 09/14/20 on CT Chest.  CLINICAL DATA:  Restaging of RIGHT-sided lung cancer in a 70 year old female.   EXAM: CT CHEST WITHOUT CONTRAST   TECHNIQUE: Multidetector CT imaging of the chest was performed following the standard protocol without IV contrast.   COMPARISON:  February 24, 2020.   FINDINGS: Cardiovascular: Heart size is normal. No pericardial effusion. Central pulmonary vasculature with stable appearance, normal caliber. Scattered atherosclerotic changes of the thoracic aorta without aneurysm. Limited assessment of cardiovascular structures given lack of intravenous contrast.   Mediastinum/Nodes: Small low-attenuation focus in the RIGHT thyroid (image 10/2) 18 mm. No axillary lymphadenopathy. No internal mammary lymphadenopathy.   No mediastinal or gross hilar lymphadenopathy.   RIGHT cardiophrenic lymph node (image 96/2) 13 mm short axis, approximately 13 mm on August 07, 2019. Patulous esophagus. No change in this appearance.   Lungs/Pleura: Partial lung resection changes in the periphery of the RIGHT lung following RIGHT upper lobectomy. Suture lines about the RIGHT hilum with similar appearance. Mild scarring in the peripheral RIGHT chest. Small juxta hilar pulmonary nodule (image 73/5) measuring approximately 4-5 mm in the coronal plane is unchanged compared to the study of August 07, 2019. No consolidation or sign of pleural effusion.   Upper Abdomen: Stable appearance of  low-density hepatic cysts. Lobular hepatic contours. Incidental imaging of upper abdominal contents with imaged portions of gallbladder, pancreas, spleen and adrenal glands without acute process or significant finding.   No perinephric stranding about the upper kidneys. No acute gastrointestinal process to the extent evaluated.   Musculoskeletal: No acute musculoskeletal findings. No destructive bone process.   IMPRESSION: 1. Stable postoperative changes in the RIGHT chest. No definite evidence of recurrent or metastatic disease. 2. Small LEFT upper lobe pulmonary nodule not changed since June of 2021,, not definitely seen in December of 2021 though there was some motion in this area. Suggest attention on follow-up. 3. RIGHT thyroid nodule 1.8 cm Recommend thyroid US (ref: J Am Coll Radiol. 2015 Feb;12(2): 143-50). 4. Aortic atherosclerosis.   Aortic Atherosclerosis (ICD10-I70.0).     Electronically Signed   By: Zetta Bills M.D.   On: 09/15/2020 12:49   Results for orders placed or performed in visit on 12/13/20  TSH  Result Value Ref Range   TSH 1.19 0.40 - 4.50 mIU/L  Hemoglobin A1c  Result Value Ref Range   Hgb A1c MFr Bld 5.3 <5.7 % of total Hgb   Mean Plasma Glucose 105 mg/dL   eAG (mmol/L) 5.8 mmol/L  Lipid panel  Result Value Ref Range   Cholesterol 174 <200 mg/dL   HDL 68 > OR = 50 mg/dL   Triglycerides 62 <150 mg/dL   LDL Cholesterol (Calc) 92 mg/dL (calc)   Total CHOL/HDL Ratio 2.6 <5.0 (calc)   Non-HDL Cholesterol (Calc) 106 <130 mg/dL (calc)  CBC with Differential/Platelet  Result Value Ref Range   WBC 3.7 (L) 3.8 - 10.8 Thousand/uL   RBC 4.85 3.80 - 5.10 Million/uL   Hemoglobin 14.9 11.7 - 15.5 g/dL   HCT 44.2 35.0 - 45.0 %   MCV 91.1 80.0 - 100.0 fL   MCH 30.7 27.0 - 33.0 pg   MCHC 33.7 32.0 - 36.0 g/dL   RDW 11.9 11.0 - 15.0 %   Platelets 107 (L) 140 - 400 Thousand/uL   MPV 11.2 7.5 - 12.5 fL   Neutro Abs 1,332 (L) 1,500 - 7,800 cells/uL    Lymphs Abs 1,924 850 - 3,900 cells/uL   Absolute Monocytes 374 200 - 950 cells/uL   Eosinophils Absolute 52 15 - 500 cells/uL   Basophils Absolute 19 0 - 200 cells/uL   Neutrophils Relative % 36 %   Total Lymphocyte 52.0 %   Monocytes Relative 10.1 %   Eosinophils Relative 1.4 %   Basophils Relative 0.5 %   Smear Review    COMPLETE METABOLIC PANEL WITH GFR  Result Value Ref Range   Glucose, Bld 98 65 - 99 mg/dL   BUN 15 7 - 25 mg/dL   Creat 0.77 0.60 - 1.00 mg/dL   eGFR 82 > OR = 60 mL/min/1.23m   BUN/Creatinine Ratio NOT APPLICABLE 6 - 22 (calc)   Sodium 140 135 - 146 mmol/L   Potassium 4.3 3.5 - 5.3 mmol/L   Chloride 102 98 - 110 mmol/L   CO2 31 20 - 32 mmol/L   Calcium 9.3 8.6 - 10.4 mg/dL   Total Protein 7.1 6.1 - 8.1 g/dL   Albumin 3.7 3.6 - 5.1 g/dL   Globulin 3.4 1.9 - 3.7 g/dL (calc)   AG Ratio 1.1 1.0 - 2.5 (calc)   Total Bilirubin 0.7 0.2 - 1.2 mg/dL   Alkaline phosphatase (APISO) 66  37 - 153 U/L   AST 97 (H) 10 - 35 U/L   ALT 80 (H) 6 - 29 U/L      Assessment & Plan:   Problem List Items Addressed This Visit     Thrombocytopenia (HCC)    Last lab CBC low PLT >100 No symptoms or bleeding On ASA Followed by Heme      Primary adenocarcinoma of right lung [facility])    S/p lobectomy primary R adenocarcinoma On surveillance per Oncology [facility] CC CT imaging 09/2020, next 6 months      Morbid obesity (HCC)    Weight gain 5 lbs in past 3-4 months Diet improved Co morbid conditions HTN HLD PreDM, with BMI >36      Essential hypertension    Elevated BP today repeat manual improved, admits ate sausage salty and smoked before apt - Home BP readings reported normal No known complications  Plan:  1. Continue current BP regimen - HCTZ 12.89m daily - discussed dose inc to 24min future if elevated BP 2. Encourage improved lifestyle - low sodium diet, regular exercise - need to return to ortho for knee 3. Continue monitor BP outside office, bring readings to next  visit, if persistently >140/90 or new symptoms notify office sooner      Elevated hemoglobin A1c    Major improvement A1c down to 5.3 below range of Pre Diabetes Due to diet improvement, low sugar      Chronic hepatitis C without hepatic coma (HCC)    Followed by GI Has 2 more packs of Hep C medication anti viral      Other Visit Diagnoses     Annual physical exam    -  Primary   Needs flu shot       Relevant Orders   Flu Vaccine QUAD High Dose(Fluad) (Completed)       Updated Health Maintenance information Flu Shot today Consider COVID booster, shingrix Mammo scheduled 11/16 FOBT due, has at home Reviewed recent lab results with patient Encouraged improvement to lifestyle with diet and exercise Goal of weight loss   No orders of the defined types were placed in this encounter.     Follow up plan: Return in about 6 months (around 06/21/2021) for 6 month follow-up PreDM A1c, HTN, Onc/updates.  AlNobie PutnamDOAlgonaedical Group 12/21/2020, 8:31 AM

## 2020-12-21 NOTE — Assessment & Plan Note (Signed)
Followed by GI Has 2 more packs of Hep C medication anti viral

## 2020-12-21 NOTE — Assessment & Plan Note (Signed)
Weight gain 5 lbs in past 3-4 months Diet improved Co morbid conditions HTN HLD PreDM, with BMI >36

## 2020-12-21 NOTE — Assessment & Plan Note (Signed)
Major improvement A1c down to 5.3 below range of Pre Diabetes Due to diet improvement, low sugar

## 2021-01-19 ENCOUNTER — Ambulatory Visit
Admission: RE | Admit: 2021-01-19 | Discharge: 2021-01-19 | Disposition: A | Discharge status: Home / Self Care | Payer: Medicare Other | Source: Ambulatory Visit | Attending: Family Medicine | Admitting: Family Medicine

## 2021-01-19 ENCOUNTER — Other Ambulatory Visit: Payer: Self-pay

## 2021-01-19 DIAGNOSIS — Z1231 Encounter for screening mammogram for malignant neoplasm of breast: Secondary | ICD-10-CM | POA: Diagnosis not present

## 2021-03-23 ENCOUNTER — Ambulatory Visit
Admission: RE | Admit: 2021-03-23 | Discharge: 2021-03-23 | Disposition: A | Discharge status: Home / Self Care | Payer: Medicare Other | Source: Ambulatory Visit | Attending: Oncology | Admitting: Oncology

## 2021-03-23 ENCOUNTER — Other Ambulatory Visit: Payer: Self-pay

## 2021-03-23 DIAGNOSIS — C3490 Malignant neoplasm of unspecified part of unspecified bronchus or lung: Secondary | ICD-10-CM | POA: Diagnosis not present

## 2021-03-23 DIAGNOSIS — J432 Centrilobular emphysema: Secondary | ICD-10-CM | POA: Diagnosis not present

## 2021-03-23 DIAGNOSIS — J439 Emphysema, unspecified: Secondary | ICD-10-CM | POA: Diagnosis not present

## 2021-03-23 DIAGNOSIS — I700 Atherosclerosis of aorta: Secondary | ICD-10-CM | POA: Diagnosis not present

## 2021-03-23 DIAGNOSIS — C3491 Malignant neoplasm of unspecified part of right bronchus or lung: Secondary | ICD-10-CM | POA: Insufficient documentation

## 2021-03-25 ENCOUNTER — Encounter: Payer: Self-pay | Admitting: Oncology

## 2021-03-25 ENCOUNTER — Other Ambulatory Visit: Payer: Self-pay

## 2021-03-25 ENCOUNTER — Inpatient Hospital Stay: Payer: Medicare Other | Attending: Oncology | Admitting: Oncology

## 2021-03-25 VITALS — BP 179/90 | HR 67 | Temp 98.2°F | Resp 17 | Wt 178.0 lb

## 2021-03-25 DIAGNOSIS — Z85118 Personal history of other malignant neoplasm of bronchus and lung: Secondary | ICD-10-CM | POA: Diagnosis not present

## 2021-03-25 DIAGNOSIS — F17210 Nicotine dependence, cigarettes, uncomplicated: Secondary | ICD-10-CM | POA: Insufficient documentation

## 2021-03-25 DIAGNOSIS — Z08 Encounter for follow-up examination after completed treatment for malignant neoplasm: Secondary | ICD-10-CM | POA: Insufficient documentation

## 2021-03-25 NOTE — Progress Notes (Signed)
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  She has a longstanding history of smoking and recently underwent CT. Lung cancer screening which showed a 22.4 mm irregular nodule in the right upper lobe.  This was followed by a PET CT scan which showed a spiculated 2.7 cm right upper lobe lung mass with an SUV of 26.8 consistent with primary lung cancer.  There was no evidence of hypermetabolic mediastinal or hilar adenopathy.  No evidence of metastatic disease elsewhere.  Patient referred to [place] for further recommendations.  Overall she is doing well.  She lives alone and is independent of her ADLs and IADLs.  She is also had her PFTs done which have been normal.  Reports that her appetite is good and she denies any unintentional weight loss   Patient was seen by Dr. Genevive Bi and underwent right upper lobectomy on 04/02/2018. Final pathology showed 2.5 cm adenocarcinoma, micropapillary predominant with visceral pleural invasion.  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  Benign 0.2 mm nodule of ossified cartilage.  1 lobar and 8 other lymph nodes were examined and were negative for malignancy.  Lymphovascular invasion not identified.  Margins uninvolved.  pT2A pN0.  After risks and benefits discussion patient chose not to proceed with adjuvant chemotherapy  Interval  history-patient reports doing well overall.  She did not take her blood pressure medication this morning and her systolic blood pressure is 179.  States that she just got out of her work which starts at 4:30 in the morning.  ECOG PS- 1 Pain scale- 0   Review of systems- Review of Systems  Constitutional:  Negative for chills, fever, malaise/fatigue and weight loss.  HENT:  Negative for congestion, ear discharge and nosebleeds.   Eyes:  Negative for blurred vision.  Respiratory:  Negative for cough, hemoptysis, sputum production, shortness of breath and wheezing.   Cardiovascular:  Negative for chest pain, palpitations, orthopnea and claudication.  Gastrointestinal:  Negative for abdominal pain, blood in stool, constipation, diarrhea, heartburn, melena, nausea and vomiting.  Genitourinary:  Negative for dysuria, flank pain, frequency, hematuria and urgency.  Musculoskeletal:  Negative for back pain, joint pain and myalgias.  Skin:  Negative for rash.  Neurological:  Negative for dizziness, tingling, focal weakness, seizures, weakness and headaches.  Endo/Heme/Allergies:  Does not bruise/bleed easily.  Psychiatric/Behavioral:  Negative for depression and suicidal ideas. The patient does not have insomnia.       Allergies  Allergen Reactions   Tramadol Rash     Past Medical History:  Diagnosis Date   Allergy    Cataract    Genital warts    GERD (gastroesophageal reflux disease)    H/O   History of kidney stones    Hyperlipidemia    Hypertension    Lung nodule    Primary cancer of right lung (HCC)    Tobacco dependency      Past Surgical History:  Procedure Laterality Date  ABDOMINAL HYSTERECTOMY     CESAREAN SECTION     X2   KNEE ARTHROSCOPY WITH MEDIAL MENISECTOMY Right 05/22/2016   Procedure: KNEE ARTHROSCOPY WITH PARTIAL MEDIAL MENISECTOMY;  Surgeon: Dereck Leep, MD;  Location: [facility] ORS;  Service: Orthopedics;  Laterality: Right;   OOPHORECTOMY     THORACOTOMY  Right 04/02/2018   Procedure: THORACOTOMY AND LUNG RESECTION WITH PREOP BRONCH;  Surgeon: Nestor Lewandowsky, MD;  Location: [facility] ORS;  Service: General;  Laterality: Right;    Social History   Socioeconomic History   Marital status: Divorced    Spouse name: Not on file   Number of children: 2   Years of education: High School   Highest education level: Not on file  Occupational History   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 pt using nicorette gum   Vaping Use   Vaping Use: Never used  Substance and Sexual Activity   Alcohol use: Yes    Comment: half a pint of liquor every week   Drug use: No   Sexual activity: Not Currently    Birth control/protection: Surgical  Other Topics Concern   Not on file  Social History Narrative   Lives at home by herself, independent at baseline   Social Determinants of Health   Financial Resource Strain: Not on file  Food Insecurity: Not on file  Transportation Needs: Not on file  Physical Activity: Not on file  Stress: Not on file  Social Connections: Not on file  Intimate Partner Violence: Not on file    Family History  Problem Relation Age of Onset   Heart disease Mother 76       MI   Heart attack Mother 43   Diabetes Mother    Pancreatic cancer Sister    Ovarian cancer Sister    Breast cancer Neg Hx      Current Outpatient Medications:    aspirin EC 81 MG tablet, Take 1 tablet (81 mg total) by mouth daily., Disp: , Rfl:    Calcium Carbonate-Vitamin D (CALCIUM 600/VITAMIN D PO), Take 1 tablet by mouth daily., Disp: , Rfl:    hydrochlorothiazide (MICROZIDE) 12.5 MG capsule, Take 1 capsule (12.5 mg total) by mouth daily., Disp: 90 capsule, Rfl: 3   MAVYRET 100-40 MG TABS, Take 3 tablets by mouth daily., Disp: , Rfl:     rosuvastatin (CRESTOR) 5 MG tablet, Take 1 tablet (5 mg total) by mouth at bedtime., Disp: 90 tablet, Rfl: 3  Physical exam:  Vitals:   03/25/21 0834  BP: (!) 179/90  Pulse: 67  Resp: 17  Temp: 98.2 F (36.8 C)  TempSrc: Tympanic  SpO2: 98%  Weight: 178 lb (80.7 kg)   Physical Exam Constitutional:      General: She is not in acute distress. Cardiovascular:     Rate and Rhythm: Normal rate and regular rhythm.     Heart sounds: Normal heart sounds.  Pulmonary:     Effort: Pulmonary effort is normal.     Breath sounds: Normal breath sounds.  Abdominal:     General: Bowel sounds are normal.     Palpations: Abdomen is soft.  Skin:    General:  Skin is warm and dry.  Neurological:     Mental Status: She is alert and oriented to person, place, and time.     CMP Latest Ref Rng & Units 12/14/2020  Glucose 65 - 99 mg/dL 98  BUN 7 - 25 mg/dL 15  Creatinine 0.60 - 1.00 mg/dL 0.77  Sodium 135 - 146 mmol/L 140  Potassium 3.5 - 5.3 mmol/L 4.3  Chloride 98 - 110 mmol/L 102  CO2 20 - 32 mmol/L 31  Calcium 8.6 - 10.4 mg/dL 9.3  Total Protein 6.1 - 8.1 g/dL 7.1  Total Bilirubin 0.2 - 1.2 mg/dL 0.7  Alkaline Phos 38 - 126 U/L -  AST 10 - 35 U/L 97(H)  ALT 6 - 29 U/L 80(H)   CBC Latest Ref Rng & Units 12/14/2020  WBC 3.8 - 10.8 Thousand/uL 3.7(L)  Hemoglobin 11.7 - 15.5 g/dL 14.9  Hematocrit 35.0 - 45.0 % 44.2  Platelets 140 - 400 Thousand/uL 107(L)    No images are attached to the encounter.  CT Chest Wo Contrast  Result Date: 03/23/2021 CLINICAL DATA:  Right upper lobe lung cancer status post lobectomy in January 2020. Restaging. EXAM: CT CHEST WITHOUT CONTRAST TECHNIQUE: Multidetector CT imaging of the chest was performed following the standard protocol without IV contrast. RADIATION DOSE REDUCTION: This exam was performed according to the departmental dose-optimization program which includes automated exposure control, adjustment of the mA and/or kV according to patient  size and/or use of iterative reconstruction technique. COMPARISON:  Chest CT 09/14/2020 and 02/24/2020. FINDINGS: Cardiovascular: Mild atherosclerosis of the aorta, great vessels and coronary arteries. No acute vascular findings on noncontrast imaging. The heart size is normal. There is no pericardial effusion. Mediastinum/Nodes: There are no enlarged mediastinal, hilar or axillary lymph nodes.Hilar assessment is limited by the lack of intravenous contrast, although the hilar contours appear unchanged. Right cardiophrenic angle lymph node measures 9 mm short axis on image 90/2, previously 13 mm. Stable small low-density right thyroid lesions measuring up to 1.4 cm on image 10/2. No followup recommended (ref: J Am Coll Radiol. 2015 Feb;12(2): 143-50). Lungs/Pleura: No pleural effusion or pneumothorax. Mild centrilobular and paraseptal emphysema. There are postsurgical changes from previous right upper lobectomy. No suspicious pulmonary nodules. Upper abdomen: The visualized upper abdomen appears stable without acute findings. Mild hepatic steatosis with mild contour irregularity of the liver. Scattered hepatic cysts are unchanged. No adrenal mass. Musculoskeletal/Chest wall: There is no chest wall mass or suspicious osseous finding. Mild thoracic spondylosis. IMPRESSION: 1. Generally stable postoperative appearance of the chest status post right upper lobectomy. No evidence of local recurrence or metastatic disease. A previously demonstrated prominent right cardiophrenic angle lymph node has decreased in size. 2. Mild Aortic Atherosclerosis (ICD10-I70.0) and Emphysema (ICD10-J43.9). 3. Hepatic steatosis with mild contour irregularity of the liver suspicious for early cirrhosis. Electronically Signed   By: Richardean Sale M.D.   On: 03/23/2021 11:17     Assessment and plan- Patient is a 71 y.o. female  with stage Ib adenocarcinoma of the right upper lobe pT2 apN0 cM0 status post right upper lobectomy.  She is here  to discuss CT scan results and further management.  I have reviewed CT chest images independently and discussed findings with the patient which does not show any evidence of recurrent or progressive disease.  She was diagnosed with lung cancer in January 2020.  Her plan is to continue 86-month scans this year followed by switching her to yearly surveillance scans.   Visit Diagnosis 1. Encounter  for follow-up surveillance of lung cancer      Dr. Randa Evens, MD, MPH [facility] [phone] 03/25/2021 8:37 AM

## 2021-03-25 NOTE — Progress Notes (Signed)
Patient here for oncology follow-up appointment, expresses no complaints or concerns at this time.    

## 2021-03-29 ENCOUNTER — Ambulatory Visit: Payer: Medicare Other

## 2021-04-15 ENCOUNTER — Ambulatory Visit (INDEPENDENT_AMBULATORY_CARE_PROVIDER_SITE_OTHER): Payer: Medicare Other

## 2021-04-15 ENCOUNTER — Other Ambulatory Visit: Payer: Self-pay

## 2021-04-15 VITALS — BP 160/78 | HR 74 | Temp 98.0°F | Ht 61.0 in | Wt 178.8 lb

## 2021-04-15 DIAGNOSIS — Z0000 Encounter for general adult medical examination without abnormal findings: Secondary | ICD-10-CM | POA: Diagnosis not present

## 2021-04-15 NOTE — Patient Instructions (Signed)
Joanna Morris , Thank you for taking time to come for your Medicare Wellness Visit. I appreciate your ongoing commitment to your health goals. Please review the following plan we discussed and let me know if I can assist you in the future.   Screening recommendations/referrals: Colonoscopy: 12/10/15 Mammogram: 01/19/21 Bone Density: 01/08/17 Recommended yearly ophthalmology/optometry visit for glaucoma screening and checkup Recommended yearly dental visit for hygiene and checkup  Vaccinations: Influenza vaccine: 12/21/20 Pneumococcal vaccine: n/d Tdap vaccine: n/d Shingles vaccine: n/d   Covid-19:05/01/19, 05/27/19, 01/03/20  Advanced directives: no  Conditions/risks identified: none  Next appointment: Follow up in one year for your annual wellness visit - 04/21/20 @ 8:20am in person   Preventive Care 73 Years and Older, Female Preventive care refers to lifestyle choices and visits with your health care provider that can promote health and wellness. What does preventive care include? A yearly physical exam. This is also called an annual well check. Dental exams once or twice a year. Routine eye exams. Ask your health care provider how often you should have your eyes checked. Personal lifestyle choices, including: Daily care of your teeth and gums. Regular physical activity. Eating a healthy diet. Avoiding tobacco and drug use. Limiting alcohol use. Practicing safe sex. Taking low-dose aspirin every day. Taking vitamin and mineral supplements as recommended by your health care provider. What happens during an annual well check? The services and screenings done by your health care provider during your annual well check will depend on your age, overall health, lifestyle risk factors, and family history of disease. Counseling  Your health care provider may ask you questions about your: Alcohol use. Tobacco use. Drug use. Emotional well-being. Home and relationship  well-being. Sexual activity. Eating habits. History of falls. Memory and ability to understand (cognition). Work and work Statistician. Reproductive health. Screening  You may have the following tests or measurements: Height, weight, and BMI. Blood pressure. Lipid and cholesterol levels. These may be checked every 5 years, or more frequently if you are over 54 years old. Skin check. Lung cancer screening. You may have this screening every year starting at age 73 if you have a 30-pack-year history of smoking and currently smoke or have quit within the past 15 years. Fecal occult blood test (FOBT) of the stool. You may have this test every year starting at age 67. Flexible sigmoidoscopy or colonoscopy. You may have a sigmoidoscopy every 5 years or a colonoscopy every 10 years starting at age 73. Hepatitis C blood test. Hepatitis B blood test. Sexually transmitted disease (STD) testing. Diabetes screening. This is done by checking your blood sugar (glucose) after you have not eaten for a while (fasting). You may have this done every 1-3 years. Bone density scan. This is done to screen for osteoporosis. You may have this done starting at age 11. Mammogram. This may be done every 1-2 years. Talk to your health care provider about how often you should have regular mammograms. Talk with your health care provider about your test results, treatment options, and if necessary, the need for more tests. Vaccines  Your health care provider may recommend certain vaccines, such as: Influenza vaccine. This is recommended every year. Tetanus, diphtheria, and acellular pertussis (Tdap, Td) vaccine. You may need a Td booster every 10 years. Zoster vaccine. You may need this after age 10. Pneumococcal 13-valent conjugate (PCV13) vaccine. One dose is recommended after age 82. Pneumococcal polysaccharide (PPSV23) vaccine. One dose is recommended after age 30. Talk to your health care  provider about which  screenings and vaccines you need and how often you need them. This information is not intended to replace advice given to you by your health care provider. Make sure you discuss any questions you have with your health care provider. Document Released: 03/19/2015 Document Revised: 11/10/2015 Document Reviewed: 12/22/2014 Elsevier Interactive Patient Education  2017 [place] Prevention in the Home Falls can cause injuries. They can happen to people of all ages. There are many things you can do to make your home safe and to help prevent falls. What can I do on the outside of my home? Regularly fix the edges of walkways and driveways and fix any cracks. Remove anything that might make you trip as you walk through a door, such as a raised step or threshold. Trim any bushes or trees on the path to your home. Use bright outdoor lighting. Clear any walking paths of anything that might make someone trip, such as rocks or tools. Regularly check to see if handrails are loose or broken. Make sure that both sides of any steps have handrails. Any raised decks and porches should have guardrails on the edges. Have any leaves, snow, or ice cleared regularly. Use sand or salt on walking paths during winter. Clean up any spills in your garage right away. This includes oil or grease spills. What can I do in the bathroom? Use night lights. Install grab bars by the toilet and in the tub and shower. Do not use towel bars as grab bars. Use non-skid mats or decals in the tub or shower. If you need to sit down in the shower, use a plastic, non-slip stool. Keep the floor dry. Clean up any water that spills on the floor as soon as it happens. Remove soap buildup in the tub or shower regularly. Attach bath mats securely with double-sided non-slip rug tape. Do not have throw rugs and other things on the floor that can make you trip. What can I do in the bedroom? Use night lights. Make sure that you have a  light by your bed that is easy to reach. Do not use any sheets or blankets that are too big for your bed. They should not hang down onto the floor. Have a firm chair that has side arms. You can use this for support while you get dressed. Do not have throw rugs and other things on the floor that can make you trip. What can I do in the kitchen? Clean up any spills right away. Avoid walking on wet floors. Keep items that you use a lot in easy-to-reach places. If you need to reach something above you, use a strong step stool that has a grab bar. Keep electrical cords out of the way. Do not use floor polish or wax that makes floors slippery. If you must use wax, use non-skid floor wax. Do not have throw rugs and other things on the floor that can make you trip. What can I do with my stairs? Do not leave any items on the stairs. Make sure that there are handrails on both sides of the stairs and use them. Fix handrails that are broken or loose. Make sure that handrails are as long as the stairways. Check any carpeting to make sure that it is firmly attached to the stairs. Fix any carpet that is loose or worn. Avoid having throw rugs at the top or bottom of the stairs. If you do have throw rugs, attach them to the  floor with carpet tape. Make sure that you have a light switch at the top of the stairs and the bottom of the stairs. If you do not have them, ask someone to add them for you. What else can I do to help prevent falls? Wear shoes that: Do not have high heels. Have rubber bottoms. Are comfortable and fit you well. Are closed at the toe. Do not wear sandals. If you use a stepladder: Make sure that it is fully opened. Do not climb a closed stepladder. Make sure that both sides of the stepladder are locked into place. Ask someone to hold it for you, if possible. Clearly mark and make sure that you can see: Any grab bars or handrails. First and last steps. Where the edge of each step  is. Use tools that help you move around (mobility aids) if they are needed. These include: Canes. Walkers. Scooters. Crutches. Turn on the lights when you go into a dark area. Replace any light bulbs as soon as they burn out. Set up your furniture so you have a clear path. Avoid moving your furniture around. If any of your floors are uneven, fix them. If there are any pets around you, be aware of where they are. Review your medicines with your doctor. Some medicines can make you feel dizzy. This can increase your chance of falling. Ask your doctor what other things that you can do to help prevent falls. This information is not intended to replace advice given to you by your health care provider. Make sure you discuss any questions you have with your health care provider. Document Released: 12/17/2008 Document Revised: 07/29/2015 Document Reviewed: 03/27/2014 Elsevier Interactive Patient Education  2017 [employer].

## 2021-04-15 NOTE — Progress Notes (Signed)
Subjective:   Joanna Morris is a 71 y.o. female who presents for Medicare Annual (Subsequent) preventive examination.  Review of Systems           Objective:    Today's Vitals   04/15/21 0823 04/15/21 0825  BP: (!) 160/78   Pulse: 74   Temp: 98 F (36.7 C)   TempSrc: Oral   SpO2: 95%   Weight: 178 lb 12.8 oz (81.1 kg)   Height: 5\' 1"  (1.549 m)   PainSc:  0-No pain   Body mass index is 33.78 kg/m.  Advanced Directives 03/25/2021 09/23/2020 03/23/2020 08/08/2019 03/18/2019 02/06/2019 09/05/2018  Does Patient Have a Medical Advance Directive? No No No No No No No  Type of Advance Directive - - - - - - -  Copy of Healthcare Power of Attorney in Chart? - - - - - - -  Would patient like information on creating a medical advance directive? No - Patient declined No - Patient declined - No - Patient declined - No - Patient declined -    Current Medications (verified) Outpatient Encounter Medications as of 04/15/2021  Medication Sig   aspirin EC 81 MG tablet Take 1 tablet (81 mg total) by mouth daily.   Calcium Carbonate-Vitamin D (CALCIUM 600/VITAMIN D PO) Take 1 tablet by mouth daily.   hydrochlorothiazide (MICROZIDE) 12.5 MG capsule Take 1 capsule (12.5 mg total) by mouth daily.   MAVYRET 100-40 MG TABS Take 3 tablets by mouth daily.   rosuvastatin (CRESTOR) 5 MG tablet Take 1 tablet (5 mg total) by mouth at bedtime.   No facility-administered encounter medications on file as of 04/15/2021.    Allergies (verified) Tramadol   History: Past Medical History:  Diagnosis Date   Allergy    Cataract    Genital warts    GERD (gastroesophageal reflux disease)    H/O   History of kidney stones    Hyperlipidemia    Hypertension    Lung nodule    Primary cancer of right lung (HCC)    Tobacco dependency    Past Surgical History:  Procedure Laterality Date   ABDOMINAL HYSTERECTOMY     CESAREAN SECTION     X2   KNEE ARTHROSCOPY WITH MEDIAL MENISECTOMY Right 05/22/2016    Procedure: KNEE ARTHROSCOPY WITH PARTIAL MEDIAL MENISECTOMY;  Surgeon: Dereck Leep, MD;  Location: [facility] ORS;  Service: Orthopedics;  Laterality: Right;   OOPHORECTOMY     THORACOTOMY Right 04/02/2018   Procedure: THORACOTOMY AND LUNG RESECTION WITH PREOP BRONCH;  Surgeon: Nestor Lewandowsky, MD;  Location: [facility] ORS;  Service: General;  Laterality: Right;   Family History  Problem Relation Age of Onset   Heart disease Mother 38       MI   Heart attack Mother 64   Diabetes Mother    Pancreatic cancer Sister    Ovarian cancer Sister    Breast cancer Neg Hx    Social History   Socioeconomic History   Marital status: Divorced    Spouse name: Not on file   Number of children: 2   Years of education: High School   Highest education level: Not on file  Occupational History   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 pt using nicorette gum   Vaping Use   Vaping Use: Never used  Substance and Sexual Activity   Alcohol use: Yes    Comment: half a pint of liquor every week   Drug use: No   Sexual activity: Not Currently    Birth control/protection: Surgical  Other Topics Concern   Not on file  Social History Narrative   Lives at home by herself, independent at baseline   Social Determinants of Health   Financial Resource Strain: Not on file  Food Insecurity: Not on file  Transportation Needs: Not on file  Physical Activity: Not on file  Stress: Not on file  Social Connections: Not on file    Tobacco Counseling Ready to quit: Not Answered Counseling given: Not Answered Tobacco comments: Quit once for 2 years. pt using nicorette gum    Clinical Intake:  Pre-visit preparation completed: Yes  Pain : No/denies pain Pain Score: 0-No pain     Nutritional  Risks: None Diabetes: No  How often do you need to have someone help you when you read instructions, pamphlets, or other written materials from your doctor or pharmacy?: 1 - Never  Diabetic?no  Interpreter Needed?: No  Information entered by :: Kirke Shaggy, LPN   Activities of Daily Living No flowsheet data found.  Patient Care Team: Olin Hauser, DO as PCP - General (Family Medicine) Marry Guan, Laurice Record, MD as Consulting Physician (Orthopedic Surgery) Telford Nab, RN as Registered Nurse  Indicate any recent [place] you may have received from other than [facility] providers in the past year (date may be approximate).     Assessment:   This is a routine wellness examination for Joanna Morris.  Hearing/Vision screen No results found.  Dietary issues and exercise activities discussed:     Goals Addressed   None    Depression Screen PHQ 2/9 Scores 12/21/2020 03/23/2020 06/23/2019 03/18/2019 01/17/2019 07/18/2018 03/13/2018  PHQ - 2 Score 0 0 0 0 0 0 0  PHQ- 9 Score 0 - - - - - -    Fall Risk Fall Risk  12/21/2020 03/23/2020 06/23/2019 03/18/2019 01/17/2019  Falls in the past year? 0 0 0 0 0  Number falls in past yr: 0 - 0 0 0  Injury with Fall? 0 - 0 0 -  Risk for fall due to : - Medication side effect - - -  Risk for fall due to: Comment - - - - -  Follow up Falls evaluation completed Falls evaluation completed;Education provided;Falls prevention discussed Falls evaluation completed - Falls evaluation completed    FALL RISK PREVENTION PERTAINING TO THE HOME:  Any stairs in or around the home? No  If so, are there any without handrails? No  Home free of loose throw rugs in walkways, pet beds, electrical cords, etc? Yes  Adequate lighting in your home to reduce risk of falls? Yes   ASSISTIVE DEVICES UTILIZED TO PREVENT FALLS:  Life alert? No  Use of a cane, walker or w/c? No  Grab bars in the bathroom? Yes  Shower chair or bench in shower? No  Elevated toilet  seat or a handicapped toilet? No   TIMED UP AND GO:  Was the test performed? Yes .  Length of time to ambulate 10 feet: 4 sec.   Gait steady and fast without use of assistive device  Cognitive Function:     6CIT Screen 03/23/2020 03/18/2019 03/13/2018 12/05/2016  What  Year? 0 points 0 points 0 points 0 points  What month? 0 points 0 points 0 points 0 points  What time? 0 points 0 points 0 points 0 points  Count back from 20 0 points 0 points 0 points 0 points  Months in reverse 0 points 0 points 0 points 0 points  Repeat phrase 0 points 0 points 2 points 2 points  Total Score 0 0 2 2    Immunizations Immunization History  Administered Date(s) Administered   Fluad Quad(high Dose 65+) 01/17/2019, 02/10/2020, 12/21/2020   Influenza, High Dose Seasonal PF 12/27/2016, 01/16/2018   PFIZER(Purple Top)SARS-COV-2 Vaccination 05/01/2019, 05/27/2019, 01/03/2020    TDAP status: Due, Education has been provided regarding the importance of this vaccine. Advised may receive this vaccine at local pharmacy or Health Dept. Aware to provide a copy of the vaccination record if obtained from local pharmacy or Health Dept. Verbalized acceptance and understanding.  Flu Vaccine status: Up to date  Pneumococcal vaccine status: Declined,  Education has been provided regarding the importance of this vaccine but patient still declined. Advised may receive this vaccine at local pharmacy or Health Dept. Aware to provide a copy of the vaccination record if obtained from local pharmacy or Health Dept. Verbalized acceptance and understanding.   Covid-19 vaccine status: Completed vaccines  Qualifies for Shingles Vaccine? Yes   Zostavax completed No   Shingrix Completed?: No.    Education has been provided regarding the importance of this vaccine. Patient has been advised to call insurance company to determine out of pocket expense if they have not yet received this vaccine. Advised may also receive vaccine at local  pharmacy or Health Dept. Verbalized acceptance and understanding.  Screening Tests Health Maintenance  Topic Date Due   Pneumonia Vaccine 82+ Years old (1 - PCV) Never done   Zoster Vaccines- Shingrix (1 of 2) Never done   COLON CANCER SCREENING ANNUAL FOBT  12/21/2019   COVID-19 Vaccine (4 - Booster for Pfizer series) 02/28/2020   TETANUS/TDAP  12/09/2025 (Originally 05/17/1969)   MAMMOGRAM  01/20/2023   COLONOSCOPY (Pts 45-89yrs Insurance coverage will need to be confirmed)  12/09/2025   INFLUENZA VACCINE  Completed   DEXA SCAN  Completed   Hepatitis C Screening  Completed   HPV VACCINES  Aged Out    Health Maintenance  Health Maintenance Due  Topic Date Due   Pneumonia Vaccine 12+ Years old (1 - PCV) Never done   Zoster Vaccines- Shingrix (1 of 2) Never done   COLON CANCER SCREENING ANNUAL FOBT  12/21/2019   COVID-19 Vaccine (4 - Booster for [place] series) 02/28/2020    Colorectal cancer screening: Type of screening: Colonoscopy. Completed 12/10/15. Repeat every 10 years  Mammogram status: Completed 01/19/21. Repeat every year  Bone Density status: Completed 01/08/17. Results reflect: Bone density results: NORMAL. Repeat every 5 years.- declined referral  Lung Cancer Screening: (Low Dose CT Chest recommended if Age 37-80 years, 30 pack-year currently smoking OR have quit w/in 15years.) does qualify.   Lung Cancer Screening Referral: just had one done, had in January  Additional Screening:  Hepatitis C Screening: does qualify; Completed 12/21/20  Vision Screening: Recommended annual ophthalmology exams for early detection of glaucoma and other disorders of the eye. Is the patient up to date with their annual eye exam?  Yes  Who is the provider or what is the name of the office in which the patient attends annual eye exams? [facility] If pt is not established with  a provider, would they like to be referred to a provider to establish care? No .   Dental Screening:  Recommended annual dental exams for proper oral hygiene  Community Resource Referral / Chronic Care Management: CRR required this visit?  No   CCM required this visit?  No      Plan:     I have personally reviewed and noted the following in the patients chart:   Medical and social history Use of alcohol, tobacco or illicit drugs  Current medications and supplements including opioid prescriptions.  Functional ability and status Nutritional status Physical activity Advanced directives List of other physicians Hospitalizations, surgeries, and ER visits in previous 12 months Vitals Screenings to include cognitive, depression, and falls Referrals and appointments  In addition, I have reviewed and discussed with patient certain preventive protocols, quality metrics, and best practice recommendations. A written personalized care plan for preventive services as well as general preventive health recommendations were provided to patient.     Dionisio David, LPN   0/90/3014   Nurse Notes: none

## 2021-06-01 ENCOUNTER — Telehealth: Payer: Self-pay | Admitting: Pharmacist

## 2021-06-01 NOTE — Telephone Encounter (Signed)
?  Chronic Care Management  ? ?Outreach Note ? ?06/01/2021 ?Name: Joanna Morris MRN: [id number] DOB: [birth year] ? ?Referred by: Olin Hauser, DO ?Reason for referral : No chief complaint on file. ? ? ?Patient appearing on report for [place] [employer] Hypertension Control due to last documented ambulatory blood pressure of 179/90 on 03/25/2021. Next appointment with PCP is 06/28/2021  ? ?Outreached patient to discuss hypertension control and medication management. Patient reports that she is currently at work. Schedule appointment for patient to speak with CM Pharmacist by phone when patient can be at home. ? ? ?Follow Up Plan: CM Pharmacist will outreach to patient again on 4/5 at 8:30 am ? ?Wallace Cullens, PharmD, BCACP ?Clinical Pharmacist ?[facility] ?[phone] ? ?

## 2021-06-08 ENCOUNTER — Ambulatory Visit (INDEPENDENT_AMBULATORY_CARE_PROVIDER_SITE_OTHER): Payer: Medicare Other | Admitting: Pharmacist

## 2021-06-08 DIAGNOSIS — I10 Essential (primary) hypertension: Secondary | ICD-10-CM

## 2021-06-08 NOTE — Progress Notes (Addendum)
?  Chronic Care Management  ? ?Outreach Note ? ?06/08/2021 ?Name: ELWYN LOWDEN MRN: [id number] DOB: 1951-01-12 ? ? ?I connected with  Arsenia Goracke Stemm on 06/08/21 by telephone outreach and verified that I am speaking with the correct person using two identifiers. ? ?Patient appearing on report for [place] Metric Hypertension Control due to last documented ambulatory blood pressure of 179/90 on 03/25/2021. Next appointment with PCP is 06/28/2021  ? ?Outreached patient to discuss hypertension control and medication management as scheduled ? ?Outpatient Encounter Medications as of 06/08/2021  ?Medication Sig Note  ? aspirin EC 81 MG tablet Take 1 tablet (81 mg total) by mouth daily.   ? Calcium Carbonate-Vitamin D (CALCIUM 600/VITAMIN D PO) Take 1 tablet by mouth daily. 03/18/2019: Calcium only  ? hydrochlorothiazide (MICROZIDE) 12.5 MG capsule Take 1 capsule (12.5 mg total) by mouth daily.   ? MAVYRET 100-40 MG TABS Take 3 tablets by mouth daily. (Patient not taking: Reported on 04/15/2021)   ? rosuvastatin (CRESTOR) 5 MG tablet Take 1 tablet (5 mg total) by mouth at bedtime.   ? ?No facility-administered encounter medications on file as of 06/08/2021.  ? ? ?Current medications: hydrochlorothiazide 12.5 mg daily - patient reports currently taking every OTHER day. Reports taking this way as felt like was not needed daily ? ?Patient does not have an automated upper arm home BP machine. ?No home readings available ?Encourage patient to contact over the counter benefit (OTC) of [employer] plan to order an upper arm blood pressure monitor ?Counsel on BP monitoring technique ? ?Current physical activity: Reports walks ~30 minutes every day ? ?Patient denies adding salt to her food. ? ?Assessment/Plan: ?Currently uncontrolled ?Reviewed appropriate administration of medication regimen. Advised patient to start taking hydrochlorothiazide 12.5 mg EVERY DAY as directed ?Counseled on long term microvascular and macrovascular  complications of uncontrolled hypertension ?Patient to contact health plan OTC benefit to order home upper arm monitor ?Reviewed appropriate home BP monitoring technique (avoid caffeine, smoking, and exercise for 30 minutes before checking, rest for at least 5 minutes before taking BP, sit with feet flat on the floor and back against a hard surface, uncross legs, and rest arm on flat surface) ?Discussed dietary modifications, such as reduced salt intake, focus on whole grains, vegetables, lean proteins ?Reviewed strategies to improve medication adherence such as taking medication consistently the same time each day ?Patient to attend upcoming appointment with PCP on 4/25 ? ? ?Follow Up Plan: CM Pharmacist scheduled to outreach to patient by telephone again on 5/22 at 8:30 am ? ?Wallace Cullens, PharmD, BCACP ?Clinical Pharmacist ?[facility] ?[phone] ? ?

## 2021-06-08 NOTE — Patient Instructions (Addendum)
Please contact your health plan over the counter (OTC) benefit to order an upper arm blood pressure monitor and start to check your blood pressure at home . Our goal is less than 140/90 ? ?We recommend a blood pressure cuff that goes around your upper arm, as these are generally the most accurate.  ? ?To appropriately check your blood pressure, make sure you do the following:  ?1) Avoid caffeine, exercise, or tobacco products for 30 minutes before checking. ?2) Sit with your back supported in a flat-backed chair. Rest your arm on something flat (arm of the chair, table, etc). ?3) Sit still with your feet flat on the floor, resting, for at least 5 minutes.  ?4) Check your blood pressure. Take 1-2 readings.  ? ?Write down these readings and bring with you to any provider appointments. Bring your home blood pressure machine with you to a provider's office for accuracy comparison at least once a year. ? ?It was a pleasure speaking with you! Our next telephone call is scheduled for 5/22 at 8:30 am ? ?Wallace Cullens, PharmD, BCACP, CPP ?Clinical Pharmacist ?[facility] ?[place] ?[phone] ? ? ? ?

## 2021-06-22 ENCOUNTER — Ambulatory Visit: Payer: Medicare Other | Admitting: Family Medicine

## 2021-06-28 ENCOUNTER — Encounter: Payer: Self-pay | Admitting: Family Medicine

## 2021-06-28 ENCOUNTER — Ambulatory Visit (INDEPENDENT_AMBULATORY_CARE_PROVIDER_SITE_OTHER): Payer: Medicare Other | Admitting: Family Medicine

## 2021-06-28 ENCOUNTER — Other Ambulatory Visit: Payer: Self-pay | Admitting: Family Medicine

## 2021-06-28 VITALS — BP 138/88 | HR 76 | Ht 61.0 in | Wt 179.8 lb

## 2021-06-28 DIAGNOSIS — C3491 Malignant neoplasm of unspecified part of right bronchus or lung: Secondary | ICD-10-CM | POA: Diagnosis not present

## 2021-06-28 DIAGNOSIS — E669 Obesity, unspecified: Secondary | ICD-10-CM

## 2021-06-28 DIAGNOSIS — E7800 Pure hypercholesterolemia, unspecified: Secondary | ICD-10-CM

## 2021-06-28 DIAGNOSIS — I10 Essential (primary) hypertension: Secondary | ICD-10-CM

## 2021-06-28 DIAGNOSIS — Z0000 Encounter for general adult medical examination without abnormal findings: Secondary | ICD-10-CM

## 2021-06-28 DIAGNOSIS — R7309 Other abnormal glucose: Secondary | ICD-10-CM | POA: Diagnosis not present

## 2021-06-28 MED ORDER — AMLODIPINE BESYLATE 5 MG PO TABS: 5.0000 mg | ORAL_TABLET | Freq: Every day | ORAL | 1 refills | Status: DC

## 2021-06-28 NOTE — Assessment & Plan Note (Signed)
A1c previously controlled ?Out of POC fingerstick today ?Defer blood draw today since doing well ?No changes ?Encourage lifestyle management ?Check in 6 month ?

## 2021-06-28 NOTE — Assessment & Plan Note (Signed)
S/p lobectomy primary R adenocarcinoma ?On surveillance per Oncology [facility] ?CT imaging, next 6 months ?

## 2021-06-28 NOTE — Assessment & Plan Note (Signed)
Mildly elevated initial BP, repeat manual check improved. ?- Home BP readings  ?No known complications  ?  ?Plan:  ?1. START new CCB Amlodipine 5mg  daily ?2. CONTINUE current HCTZ 12.5mg  daily - she could not tolerate 25mg  dose in past, lightheaded dizzy ?3. Encourage improved lifestyle - low sodium diet, regular exercise ?4. Continue monitor BP outside office, bring readings to next visit, if persistently >140/90 or new symptoms notify office sooner ? ?F/u with CCM pharmacy contact, sooner if needed ?

## 2021-06-28 NOTE — Patient Instructions (Addendum)
Thank you for coming to the office today. ? 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 ?START anti inflammatory topical - OTC Voltaren (generic Diclofenac) topical 2-4 times a day as needed for pain swelling of affected joint for 1-2 weeks or longer. ? ?Added new blood pressure pill today - Amlodipine 5mg  daily. Continue your previous pill as well HCTZ 12.5mg  daily ? ? ?Please schedule a Follow-up Appointment to: Return in about 6 months (around 12/28/2021) for 6 month fasting lab only then 1 week later Annual Physical. ? ?If you have any other questions or concerns, please feel free to call the office or send a message through [place]. You may also schedule an earlier appointment if necessary. ? ?Additionally, you may be receiving a survey about your experience at our office within a few days to 1 week by e-mail or mail. We value your feedback. ? ?Nobie Putnam, DO ?[place] ?

## 2021-06-28 NOTE — Progress Notes (Addendum)
? ?Subjective:  ? ? Patient ID: Joanna Morris, female    DOB: Jun 10, 1950, 71 y.o.   MRN: [id number] ? ?Joanna Morris is a 71 y.o. female presenting on 06/28/2021 for Prediabetes and Hypertension ? ? ?HPI ? ?Follow-up RUL Primary Adenocarcinoma ?Followed by [facility] ? s/p RUL lobectomy, with curative intent. She had negative lymph nodes for malignancy, opted for surveillance and monitoring without adjuvant chemotherapy at that time in 04/2018. ?- She will continue surveillance every 6 months, next July 2023 ?   ?Chronic Hepatitis C with Fibrosis ?On Anti Viral Hep C therapy per GI. ?1 more pack left. ?  ?CHRONIC HTN: ?Elevated BP at times at home 140-150+, in past 160+ ?Working with [employer] ?Current Meds - HCTZ 12.13m daily ?Did NOT take meds today. Tolerating well, w/o complaints. ?Denies CP, dyspnea, HA, edema, dizziness / lightheadedness ?  ?ELEVATED A1c ?Last A1c 5.3 (12/2020), improved from prior 5.6 to 6.2 in past ?No POC A1c available today. She is doing well ?Has family history mother DM ?Lifestyle: ?- Diet (major improvement, limited sweets now, overall better diet) ?- Exercise (Still walking regularly) ?  ?PMH Chronic R knee pain, s/p R arthroscopic partial medial meniscectomy ? ?Right hand pain stiffness ?Reports knuckle and palmar MCP joint stiffness and pain. ?Not using treatment on it. Tried brace no relief. ?  ? ? ? ?  06/28/2021  ?  8:35 AM 04/15/2021  ?  8:27 AM 12/21/2020  ?  8:27 AM  ?Depression screen PHQ 2/9  ?Decreased Interest 0 0 0  ?Down, Depressed, Hopeless 0 0 0  ?PHQ - 2 Score 0 0 0  ?Altered sleeping 0  0  ?Tired, decreased energy 0  0  ?Change in appetite 0  0  ?Feeling bad or failure about yourself  0  0  ?Trouble concentrating 0  0  ?Moving slowly or fidgety/restless 0  0  ?Suicidal thoughts 0  0  ?PHQ-9 Score 0  0  ?Difficult doing work/chores Not difficult at all  Not difficult at all  ? ? ?Social History  ? ?Tobacco Use  ? Smoking status: Every Day  ?  Packs/day: 0.50   ?  Years: 51.00  ?  Pack years: 25.50  ?  Types: Cigarettes  ? Smokeless tobacco: Never  ? Tobacco comments:  ?  Quit once for 2 years. pt using nicorette gum   ?Vaping Use  ? Vaping Use: Never used  ?Substance Use Topics  ? Alcohol use: Not Currently  ?  Alcohol/week: 1.0 - 3.0 standard drink  ?  Types: 1 - 3 Standard drinks or equivalent per week  ?  Comment: half a pint of liquor every week  ? Drug use: No  ? ? ?Review of Systems ?Per HPI unless specifically indicated above ? ?   ?Objective:  ?  ?BP 138/88 (BP Location: Left Arm, Cuff Size: Normal)   Pulse 76   Ht '5\' 1"'  (1.549 m)   Wt 179 lb 12.8 oz (81.6 kg)   SpO2 98%   BMI 33.97 kg/m?   ?Wt Readings from Last 3 Encounters:  ?06/28/21 179 lb 12.8 oz (81.6 kg)  ?04/15/21 178 lb 12.8 oz (81.1 kg)  ?03/25/21 178 lb (80.7 kg)  ?  ?Physical Exam ?Vitals and nursing note reviewed.  ?Constitutional:   ?   General: She is not in acute distress. ?   Appearance: Normal appearance. She is well-developed. She is not diaphoretic.  ?   Comments: Well-appearing,  comfortable, cooperative  ?HENT:  ?   Head: Normocephalic and atraumatic.  ?Eyes:  ?   General:     ?   Right eye: No discharge.     ?   Left eye: No discharge.  ?   Conjunctiva/sclera: Conjunctivae normal.  ?Neck:  ?   Thyroid: No thyromegaly.  ?Cardiovascular:  ?   Rate and Rhythm: Normal rate and regular rhythm.  ?   Heart sounds: Normal heart sounds. No murmur heard. ?Pulmonary:  ?   Effort: Pulmonary effort is normal. No respiratory distress.  ?   Breath sounds: Normal breath sounds. No wheezing or rales.  ?Musculoskeletal:     ?   General: Normal range of motion.  ?   Cervical back: Normal range of motion and neck supple.  ?Lymphadenopathy:  ?   Cervical: No cervical adenopathy.  ?Skin: ?   General: Skin is warm and dry.  ?   Findings: No erythema or rash.  ?Neurological:  ?   Mental Status: She is alert and oriented to person, place, and time.  ?Psychiatric:     ?   Mood and Affect: Mood normal.     ?    Behavior: Behavior normal.     ?   Thought Content: Thought content normal.  ?   Comments: Well groomed, good eye contact, normal speech and thoughts  ? ? ? ? ?Results for orders placed or performed in visit on 12/13/20  ?TSH  ?Result Value Ref Range  ? TSH 1.19 0.40 - 4.50 mIU/L  ?Hemoglobin A1c  ?Result Value Ref Range  ? Hgb A1c MFr Bld 5.3 <5.7 % of total Hgb  ? Mean Plasma Glucose 105 mg/dL  ? eAG (mmol/L) 5.8 mmol/L  ?Lipid panel  ?Result Value Ref Range  ? Cholesterol 174 <200 mg/dL  ? HDL 68 > OR = 50 mg/dL  ? Triglycerides 62 <150 mg/dL  ? LDL Cholesterol (Calc) 92 mg/dL (calc)  ? Total CHOL/HDL Ratio 2.6 <5.0 (calc)  ? Non-HDL Cholesterol (Calc) 106 <130 mg/dL (calc)  ?CBC with Differential/Platelet  ?Result Value Ref Range  ? WBC 3.7 (L) 3.8 - 10.8 Thousand/uL  ? RBC 4.85 3.80 - 5.10 Million/uL  ? Hemoglobin 14.9 11.7 - 15.5 g/dL  ? HCT 44.2 35.0 - 45.0 %  ? MCV 91.1 80.0 - 100.0 fL  ? MCH 30.7 27.0 - 33.0 pg  ? MCHC 33.7 32.0 - 36.0 g/dL  ? RDW 11.9 11.0 - 15.0 %  ? Platelets 107 (L) 140 - 400 Thousand/uL  ? MPV 11.2 7.5 - 12.5 fL  ? Neutro Abs 1,332 (L) 1,500 - 7,800 cells/uL  ? Lymphs Abs 1,924 850 - 3,900 cells/uL  ? Absolute Monocytes 374 200 - 950 cells/uL  ? Eosinophils Absolute 52 15 - 500 cells/uL  ? Basophils Absolute 19 0 - 200 cells/uL  ? Neutrophils Relative % 36 %  ? Total Lymphocyte 52.0 %  ? Monocytes Relative 10.1 %  ? Eosinophils Relative 1.4 %  ? Basophils Relative 0.5 %  ? Smear Review    ?COMPLETE METABOLIC PANEL WITH GFR  ?Result Value Ref Range  ? Glucose, Bld 98 65 - 99 mg/dL  ? BUN 15 7 - 25 mg/dL  ? Creat 0.77 0.60 - 1.00 mg/dL  ? eGFR 82 > OR = 60 mL/min/1.47m  ? BUN/Creatinine Ratio NOT APPLICABLE 6 - 22 (calc)  ? Sodium 140 135 - 146 mmol/L  ? Potassium 4.3 3.5 - 5.3 mmol/L  ? Chloride  102 98 - 110 mmol/L  ? CO2 31 20 - 32 mmol/L  ? Calcium 9.3 8.6 - 10.4 mg/dL  ? Total Protein 7.1 6.1 - 8.1 g/dL  ? Albumin 3.7 3.6 - 5.1 g/dL  ? Globulin 3.4 1.9 - 3.7 g/dL (calc)  ? AG Ratio  1.1 1.0 - 2.5 (calc)  ? Total Bilirubin 0.7 0.2 - 1.2 mg/dL  ? Alkaline phosphatase (APISO) 66 37 - 153 U/L  ? AST 97 (H) 10 - 35 U/L  ? ALT 80 (H) 6 - 29 U/L  ? ?   ?Assessment & Plan:  ? ?Problem List Items Addressed This Visit   ? ? Primary adenocarcinoma of right lung ([place])  ?  S/p lobectomy primary R adenocarcinoma ?On surveillance per Oncology [facility] ?CT imaging, next 6 months ? ?  ?  ? Obesity (BMI 30.0-34.9)  ? Essential hypertension - Primary  ?  Mildly elevated initial BP, repeat manual check improved. ?- Home BP readings  ?No known complications  ?  ?Plan:  ?1. START new CCB Amlodipine 57m daily ?2. CONTINUE current HCTZ 12.533mdaily - she could not tolerate 2544mose in past, lightheaded dizzy ?3. Encourage improved lifestyle - low sodium diet, regular exercise ?4. Continue monitor BP outside office, bring readings to next visit, if persistently >140/90 or new symptoms notify office sooner ? ?F/u with CCM pharmacy contact, sooner if needed ? ?  ?  ? Relevant Medications  ? amLODipine (NORVASC) 5 MG tablet  ? Elevated hemoglobin A1c  ?  A1c previously controlled ?Out of POC fingerstick today ?Defer blood draw today since doing well ?No changes ?Encourage lifestyle management ?Check in 6 month ? ?  ?  ?  ?Right Hand OA/DJD ?Localized to MCP region 2nd 3rd fingers ?No evidence of trigger fingers or other deformity ?Recommend OTC Voltaren topical BID PRN 1-2 weeks ? ?Meds ordered this encounter  ?Medications  ? amLODipine (NORVASC) 5 MG tablet  ?  Sig: Take 1 tablet (5 mg total) by mouth daily.  ?  Dispense:  90 tablet  ?  Refill:  1  ? ? ? ? ?Follow up plan: ?Return in about 6 months (around 12/28/2021) for 6 month fasting lab only then 1 week later Annual Physical. ? ?Future labs ordered for 12/2021 ? ? ?AleNobie PutnamO ?[facility] ?06/28/2021, 8:43 AM ?

## 2021-07-25 ENCOUNTER — Ambulatory Visit: Payer: Medicare Other | Admitting: Pharmacist

## 2021-07-25 DIAGNOSIS — I10 Essential (primary) hypertension: Secondary | ICD-10-CM

## 2021-07-25 NOTE — Chronic Care Management (AMB) (Signed)
Chronic Care Management   Outreach Note  07/25/2021 Name: Joanna Morris MRN: [id number] DOB: [birth year]  I connected with Phylliss Blakes Rorie on 07/25/21 by telephone outreach and verified that I am speaking with the correct person using two identifiers.  Patient appearing on report for [place] Metric Hypertension Control due to last documented ambulatory blood pressure of 179/90 on 03/25/2021.  Patient last seen by PCP on 06/28/2021. Provider advised patient to Start amlodipine 5 mg daily, while continuing current dose of HCTZ 12.5 mg daily.   Outreached patient to discuss hypertension control and medication management.   Outpatient Encounter Medications as of 07/25/2021  Medication Sig Note   amLODipine (NORVASC) 5 MG tablet Take 1 tablet (5 mg total) by mouth daily.    aspirin EC 81 MG tablet Take 1 tablet (81 mg total) by mouth daily.    Calcium Carbonate-Vitamin D (CALCIUM 600/VITAMIN D PO) Take 1 tablet by mouth daily. 03/18/2019: Calcium only   hydrochlorothiazide (MICROZIDE) 12.5 MG capsule Take 1 capsule (12.5 mg total) by mouth daily.    rosuvastatin (CRESTOR) 5 MG tablet Take 1 tablet (5 mg total) by mouth at bedtime.    No facility-administered encounter medications on file as of 07/25/2021.    Lab Results  Component Value Date   CREATININE 0.77 12/14/2020   BUN 15 12/14/2020   NA 140 12/14/2020   K 4.3 12/14/2020   CL 102 12/14/2020   CO2 31 12/14/2020    BP Readings from Last 3 Encounters:  06/28/21 138/88  04/15/21 (!) 160/78  03/25/21 (!) 179/90    Pulse Readings from Last 3 Encounters:  06/28/21 76  04/15/21 74  03/25/21 67    Current medications:  HCTZ 12.5 mg daily Amlodipine 5 mg daily Denies missed doses since last seen by PCP   Home Monitoring: Patient uses an automated upper arm home BP machine shared with her niece Previously encourage patient to contact over the counter benefit (OTC) of [employer] plan to order an upper arm blood  pressure monitor, but she said that this was too difficult and declines at this time. Prefers to just share monitor with niece Recent blood pressure readings:  Today: 144/105, HR 88 (reports was rushing this morning) Recalls from 5/19: 140/93 Patient denies hypotensive or hypertensive symptoms  Denies any swelling since started amlodipine Again counsel patient on BP monitoring technique, including importance of resting prior to readings and importance of avoiding caffeine, smoking, and exercise for 30 minutes before checking  Current physical activity: Reports walks ~30 minutes every day   Patient denies adding salt to her food.  Tobacco Use: Reports smokes ~1/2 pack/day Counsel on benefits of smoking cessation Patient denies interest in smoking cessation at this time  Assessment/Plan: - Currently  - Reviewed goal blood pressure <140/90 - Reviewed appropriate administration of medication regimen - Counseled on long term microvascular and macrovascular complications of uncontrolled hypertension - Reviewed appropriate home BP monitoring technique (avoid caffeine, smoking, and exercise for 30 minutes before checking, rest for at least 5 minutes before taking BP, sit with feet flat on the floor and back against a hard surface, uncross legs, and rest arm on flat surface) - Reviewed to check blood pressure, document, and provide at next provider visit - Discussed dietary modifications, such as reduced salt intake, focus on whole grains, vegetables, lean proteins - Will mail patient a blood pressure log and handout on blood pressure monitoring technique as requested   Follow Up Plan: CM Pharmacist will  outreach to patient by telephone again on 08/26/2021 at 8:30 AM  Wallace Cullens, PharmD, [facility] [phone]

## 2021-07-25 NOTE — Patient Instructions (Signed)
Check your blood pressure once daily, and any time you have concerning symptoms like headache, chest pain, dizziness, shortness of breath, or vision changes.   Our goal is less than 140/90.  To appropriately check your blood pressure, make sure you do the following:  1) Avoid caffeine, exercise, or tobacco products for 30 minutes before checking. Empty your bladder. 2) Sit with your back supported in a flat-backed chair. Rest your arm on something flat (arm of the chair, table, etc). 3) Sit still with your feet flat on the floor, resting, for at least 5 minutes.  4) Check your blood pressure. Take 1-2 readings.  5) Write down these readings and bring with you to any provider appointments.  Bring your home blood pressure machine with you to a provider's office for accuracy comparison at least once a year.   Make sure you take your blood pressure medications before you come to any office visit, even if you were asked to fast for labs.  Wallace Cullens, PharmD, [place] [phone]

## 2021-08-24 ENCOUNTER — Telehealth: Payer: Self-pay | Admitting: Oncology

## 2021-08-24 NOTE — Telephone Encounter (Signed)
Pt left VM to schedule an appt. I attempted to contact pt twice, no VM set up to leave vm.   Will attempted again.

## 2021-08-24 NOTE — Telephone Encounter (Signed)
Patient called to reschedule her afternoon appointment. Patient stated she has 2 jobs and can't miss one of them to come to a 2:00 appointment. She would like to reschedule to 8:30 or 9:00 am. next week. Please advise. Thank you

## 2021-08-24 NOTE — Telephone Encounter (Signed)
Please try to accomodate her next week

## 2021-08-26 ENCOUNTER — Telehealth: Payer: Self-pay | Admitting: Pharmacist

## 2021-08-26 ENCOUNTER — Telehealth: Payer: Medicare Other

## 2021-08-31 ENCOUNTER — Other Ambulatory Visit: Payer: Self-pay | Admitting: Family Medicine

## 2021-08-31 DIAGNOSIS — E7800 Pure hypercholesterolemia, unspecified: Secondary | ICD-10-CM

## 2021-08-31 NOTE — Telephone Encounter (Signed)
Requested Prescriptions  Pending Prescriptions Disp Refills  . rosuvastatin (CRESTOR) 5 MG tablet [Pharmacy Med Name: ROSUVASTATIN 5MG  TABLETS] 90 tablet 0    Sig: TAKE 1 TABLET(5 MG) BY MOUTH AT BEDTIME     Cardiovascular:  Antilipid - Statins 2 Failed - 08/31/2021  6:40 AM      Failed - Lipid Panel in normal range within the last 12 months    Cholesterol  Date Value Ref Range Status  12/14/2020 174 <200 mg/dL Final   LDL Cholesterol (Calc)  Date Value Ref Range Status  12/14/2020 92 mg/dL (calc) Final    Comment:    Reference range: <100 . Desirable range <100 mg/dL for primary prevention;   <70 mg/dL for patients with CHD or diabetic patients  with > or = 2 CHD risk factors. Marland Kitchen LDL-C is now calculated using the Martin-Hopkins  calculation, which is a validated novel method providing  better accuracy than the Friedewald equation in the  estimation of LDL-C.  Cresenciano Genre et al. Annamaria Helling. 6004;599(77): 2061-2068  (http://education.[employer].com/faq/FAQ164)    HDL  Date Value Ref Range Status  12/14/2020 68 > OR = 50 mg/dL Final   Triglycerides  Date Value Ref Range Status  12/14/2020 62 <150 mg/dL Final         Passed - Cr in normal range and within 360 days    Creat  Date Value Ref Range Status  12/14/2020 0.77 0.60 - 1.00 mg/dL Final         Passed - Patient is not pregnant      Passed - Valid encounter within last 12 months    Recent Outpatient Visits          1 month ago Essential hypertension   [place], Virl Diamond, RPH-CPP   2 months ago Essential hypertension   [place], Devonne Doughty, DO   2 months ago Essential hypertension   [place], RPH-CPP   8 months ago Annual physical exam   [facility] Olin Hauser, DO   1 year ago Essential hypertension   [place], Devonne Doughty, [place]

## 2021-09-14 ENCOUNTER — Ambulatory Visit: Payer: Medicare Other | Admitting: Pharmacist

## 2021-09-14 DIAGNOSIS — I10 Essential (primary) hypertension: Secondary | ICD-10-CM

## 2021-09-14 NOTE — Chronic Care Management (AMB) (Signed)
  Chronic Care Management   Outreach Note  09/14/2021 Name: Joanna Morris MRN: [id number] DOB: May 03, 1950  I connected with Joanna Morris on 09/14/21 by telephone outreach and verified that I am speaking with the correct person using two identifiers.  Patient appeared on report for [place] Metric Hypertension Control due to previous documented ambulatory blood pressure of 179/90 on 03/25/2021.   Patient last seen by PCP on 06/28/2021. Provider advised patient to Start amlodipine 5 mg daily, while continuing current dose of HCTZ 12.5 mg daily.   Outreached patient to discuss hypertension control and medication management.   Outpatient Encounter Medications as of 09/14/2021  Medication Sig Note   amLODipine (NORVASC) 5 MG tablet Take 1 tablet (5 mg total) by mouth daily.    aspirin EC 81 MG tablet Take 1 tablet (81 mg total) by mouth daily.    Calcium Carbonate-Vitamin D (CALCIUM 600/VITAMIN D PO) Take 1 tablet by mouth daily. 03/18/2019: Calcium only   hydrochlorothiazide (MICROZIDE) 12.5 MG capsule Take 1 capsule (12.5 mg total) by mouth daily.    rosuvastatin (CRESTOR) 5 MG tablet TAKE 1 TABLET(5 MG) BY MOUTH AT BEDTIME    No facility-administered encounter medications on file as of 09/14/2021.    Lab Results  Component Value Date   CREATININE 0.77 12/14/2020   BUN 15 12/14/2020   NA 140 12/14/2020   K 4.3 12/14/2020   CL 102 12/14/2020   CO2 31 12/14/2020    BP Readings from Last 3 Encounters:  06/28/21 138/88  04/15/21 (!) 160/78  03/25/21 (!) 179/90    Pulse Readings from Last 3 Encounters:  06/28/21 76  04/15/21 74  03/25/21 67    Current medications:  HCTZ 12.5 mg daily Amlodipine 5 mg daily Reports taking these medications consistently     Home Monitoring: Patient reports has obtained her own automated upper arm home BP machine  Denies having checked home blood pressure recently. Reports recently found out about a family member's illness and has been  busy with taking her to appointments Plans to restart checking home blood pressure daily. Agree to schedule appointment to review these numbers in 2 weeks Again counsel patient on BP monitoring technique, including importance of resting prior to readings and importance of avoiding caffeine, smoking, and exercise for 30 minutes before checking   Patient denies adding salt to her food. Remind patient to review nutrition labels for sodium content of foods as well   Tobacco Use: Reports smokes ~1/2 pack/day Again counsel on benefits of smoking cessation Patient denies interest in smoking cessation at this time   Assessment/Plan: - Counseled on long term microvascular and macrovascular complications of uncontrolled hypertension - Reviewed appropriate home BP monitoring technique (avoid caffeine, smoking, and exercise for 30 minutes before checking, rest for at least 5 minutes before taking BP, sit with feet flat on the floor and back against a hard surface, uncross legs, and rest arm on flat surface) - Reviewed to check blood pressure, document, and provide at next provider visit - Discussed dietary modifications, such as reduced salt and sodium intake   Follow Up Plan: CM Pharmacist will outreach to patient by telephone again on 09/28/2021 at 9:45 am  Wallace Cullens, PharmD, [place] [phone]

## 2021-09-14 NOTE — Patient Instructions (Signed)
Check your blood pressure once daily, and any time you have concerning symptoms like headache, chest pain, dizziness, shortness of breath, or vision changes.   Our goal is less than 140/90.  To appropriately check your blood pressure, make sure you do the following:  1) Avoid caffeine, exercise, or tobacco products for 30 minutes before checking. Empty your bladder. 2) Sit with your back supported in a flat-backed chair. Rest your arm on something flat (arm of the chair, table, etc). 3) Sit still with your feet flat on the floor, resting, for at least 5 minutes.  4) Check your blood pressure. Take 1-2 readings.  5) Write down these readings and bring with you to any provider appointments.  Bring your home blood pressure machine with you to a provider's office for accuracy comparison at least once a year.   Make sure you take your blood pressure medications before you come to any office visit, even if you were asked to fast for labs.  Wallace Cullens, PharmD, [place] [phone]

## 2021-09-16 ENCOUNTER — Ambulatory Visit
Admission: RE | Admit: 2021-09-16 | Discharge: 2021-09-16 | Disposition: A | Discharge status: Home / Self Care | Payer: Medicare Other | Source: Ambulatory Visit | Attending: Oncology | Admitting: Oncology

## 2021-09-16 DIAGNOSIS — Z08 Encounter for follow-up examination after completed treatment for malignant neoplasm: Secondary | ICD-10-CM | POA: Diagnosis not present

## 2021-09-16 DIAGNOSIS — Z85118 Personal history of other malignant neoplasm of bronchus and lung: Secondary | ICD-10-CM | POA: Diagnosis not present

## 2021-09-16 DIAGNOSIS — J439 Emphysema, unspecified: Secondary | ICD-10-CM | POA: Diagnosis not present

## 2021-09-16 DIAGNOSIS — C3490 Malignant neoplasm of unspecified part of unspecified bronchus or lung: Secondary | ICD-10-CM | POA: Diagnosis not present

## 2021-09-16 DIAGNOSIS — R911 Solitary pulmonary nodule: Secondary | ICD-10-CM | POA: Diagnosis not present

## 2021-09-16 DIAGNOSIS — I700 Atherosclerosis of aorta: Secondary | ICD-10-CM | POA: Diagnosis not present

## 2021-09-19 ENCOUNTER — Ambulatory Visit: Payer: Medicare Other

## 2021-09-21 ENCOUNTER — Ambulatory Visit
Admission: RE | Admit: 2021-09-21 | Discharge: 2021-09-21 | Disposition: A | Discharge status: Home / Self Care | Payer: Medicare Other | Source: Ambulatory Visit | Attending: Oncology | Admitting: Oncology

## 2021-09-21 ENCOUNTER — Telehealth: Payer: Self-pay | Admitting: *Deleted

## 2021-09-21 ENCOUNTER — Encounter: Payer: Self-pay | Admitting: Oncology

## 2021-09-21 ENCOUNTER — Ambulatory Visit: Payer: Medicare Other | Admitting: Oncology

## 2021-09-21 ENCOUNTER — Inpatient Hospital Stay (HOSPITAL_BASED_OUTPATIENT_CLINIC_OR_DEPARTMENT_OTHER): Payer: Medicare Other | Admitting: Oncology

## 2021-09-21 ENCOUNTER — Inpatient Hospital Stay: Payer: Medicare Other | Attending: Oncology

## 2021-09-21 ENCOUNTER — Other Ambulatory Visit: Payer: Medicare Other

## 2021-09-21 VITALS — BP 162/92 | HR 68 | Temp 96.9°F | Resp 18 | Wt 177.9 lb

## 2021-09-21 DIAGNOSIS — Z902 Acquired absence of lung [part of]: Secondary | ICD-10-CM | POA: Diagnosis not present

## 2021-09-21 DIAGNOSIS — M545 Low back pain, unspecified: Secondary | ICD-10-CM | POA: Insufficient documentation

## 2021-09-21 DIAGNOSIS — F17210 Nicotine dependence, cigarettes, uncomplicated: Secondary | ICD-10-CM | POA: Diagnosis not present

## 2021-09-21 DIAGNOSIS — Z79899 Other long term (current) drug therapy: Secondary | ICD-10-CM | POA: Insufficient documentation

## 2021-09-21 DIAGNOSIS — Z85118 Personal history of other malignant neoplasm of bronchus and lung: Secondary | ICD-10-CM

## 2021-09-21 DIAGNOSIS — R911 Solitary pulmonary nodule: Secondary | ICD-10-CM | POA: Diagnosis not present

## 2021-09-21 DIAGNOSIS — Z08 Encounter for follow-up examination after completed treatment for malignant neoplasm: Secondary | ICD-10-CM

## 2021-09-21 LAB — CBC WITH DIFFERENTIAL/PLATELET
Abs Immature Granulocytes: 0.01 10*3/uL (ref 0.00–0.07)
Basophils Absolute: 0 10*3/uL (ref 0.0–0.1)
Basophils Relative: 1 %
Eosinophils Absolute: 0 10*3/uL (ref 0.0–0.5)
Eosinophils Relative: 1 %
HCT: 44.2 % (ref 36.0–46.0)
Hemoglobin: 14.4 g/dL (ref 12.0–15.0)
Immature Granulocytes: 0 %
Lymphocytes Relative: 30 %
Lymphs Abs: 1.7 10*3/uL (ref 0.7–4.0)
MCH: 30 pg (ref 26.0–34.0)
MCHC: 32.6 g/dL (ref 30.0–36.0)
MCV: 92.1 fL (ref 80.0–100.0)
Monocytes Absolute: 0.5 10*3/uL (ref 0.1–1.0)
Monocytes Relative: 9 %
Neutro Abs: 3.3 10*3/uL (ref 1.7–7.7)
Neutrophils Relative %: 59 %
Platelets: 119 10*3/uL — ABNORMAL LOW (ref 150–400)
RBC: 4.8 MIL/uL (ref 3.87–5.11)
RDW: 12.1 % (ref 11.5–15.5)
WBC: 5.6 10*3/uL (ref 4.0–10.5)
nRBC: 0 % (ref 0.0–0.2)

## 2021-09-21 LAB — COMPREHENSIVE METABOLIC PANEL
ALT: 27 U/L (ref 0–44)
AST: 32 U/L (ref 15–41)
Albumin: 3.4 g/dL — ABNORMAL LOW (ref 3.5–5.0)
Alkaline Phosphatase: 53 U/L (ref 38–126)
Anion gap: 7 (ref 5–15)
BUN: 17 mg/dL (ref 8–23)
CO2: 26 mmol/L (ref 22–32)
Calcium: 10.2 mg/dL (ref 8.9–10.3)
Chloride: 105 mmol/L (ref 98–111)
Creatinine, Ser: 0.8 mg/dL (ref 0.44–1.00)
GFR, Estimated: >60 mL/min (ref >60)
Glucose, Bld: 94 mg/dL (ref 70–99)
Potassium: 3.7 mmol/L (ref 3.5–5.1)
Sodium: 138 mmol/L (ref 135–145)
Total Bilirubin: 0.5 mg/dL (ref 0.3–1.2)
Total Protein: 7.7 g/dL (ref 6.5–8.1)

## 2021-09-21 NOTE — Progress Notes (Signed)
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  She has a longstanding history of smoking and recently underwent CT. Lung cancer screening which showed a 22.4 mm irregular nodule in the right upper lobe.  This was followed by a PET CT scan which showed a spiculated 2.7 cm right upper lobe lung mass with an SUV of 26.8 consistent with primary lung cancer.  There was no evidence of hypermetabolic mediastinal or hilar adenopathy.  No evidence of metastatic disease elsewhere.  Patient referred to [place] for further recommendations.  Overall she is doing well.  She lives alone and is independent of her ADLs and IADLs.  She is also had her PFTs done which have been normal.  Reports that her appetite is good and she denies any unintentional weight loss   Patient was seen by Dr. Genevive Bi and underwent right upper lobectomy on 04/02/2018. Final pathology showed 2.5 cm adenocarcinoma, micropapillary predominant with visceral pleural invasion.  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  Benign 0.2 mm nodule of ossified cartilage.  1 lobar and 8 other lymph nodes were examined and were negative for malignancy.  Lymphovascular invasion not identified.  Margins uninvolved.  pT2A pN0.  After risks and benefits discussion patient chose not to proceed with adjuvant chemotherapy thank you thank  you   Interval history-patient reports ongoing low back pain for the last 2 weeks which radiates down to her right leg she had a fall few days ago  ECOG PS- 1 Pain scale- 6   Review of systems- Review of Systems  Constitutional:  Positive for malaise/fatigue. Negative for chills, fever and weight loss.  HENT:  Negative for congestion, ear discharge and nosebleeds.   Eyes:  Negative for blurred vision.  Respiratory:  Negative for cough, hemoptysis, sputum production, shortness of breath and wheezing.   Cardiovascular:  Negative for chest pain, palpitations, orthopnea and claudication.  Gastrointestinal:  Negative for abdominal pain, blood in stool, constipation, diarrhea, heartburn, melena, nausea and vomiting.  Genitourinary:  Negative for dysuria, flank pain, frequency, hematuria and urgency.  Musculoskeletal:  Positive for back pain. Negative for joint pain and myalgias.  Skin:  Negative for rash.  Neurological:  Negative for dizziness, tingling, focal weakness, seizures, weakness and headaches.  Endo/Heme/Allergies:  Does not bruise/bleed easily.  Psychiatric/Behavioral:  Negative for depression and suicidal ideas. The patient does not have insomnia.       Allergies  Allergen Reactions   Tramadol Rash     Past Medical History:  Diagnosis Date   Allergy    Cataract    Genital warts    GERD (gastroesophageal reflux disease)    H/O   History of kidney stones    Hyperlipidemia    Hypertension    Lung nodule    Primary cancer of right lung (HCC)    Tobacco dependency      Past Surgical History:  Procedure Laterality Date   ABDOMINAL HYSTERECTOMY  CESAREAN SECTION     X2   KNEE ARTHROSCOPY WITH MEDIAL MENISECTOMY Right 05/22/2016   Procedure: KNEE ARTHROSCOPY WITH PARTIAL MEDIAL MENISECTOMY;  Surgeon: Dereck Leep, MD;  Location: [facility] ORS;  Service: Orthopedics;  Laterality: Right;   OOPHORECTOMY     THORACOTOMY Right 04/02/2018   Procedure: THORACOTOMY AND  LUNG RESECTION WITH PREOP BRONCH;  Surgeon: Nestor Lewandowsky, MD;  Location: [facility] ORS;  Service: General;  Laterality: Right;    Social History   Socioeconomic History   Marital status: Divorced    Spouse name: Not on file   Number of children: 2   Years of education: High School   Highest education level: Not on file  Occupational History   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 pt using nicorette gum   Vaping Use   Vaping Use: Never used  Substance and Sexual Activity   Alcohol use: Not Currently    Alcohol/week: 1.0 - 3.0 standard drink of alcohol    Types: 1 - 3 Standard drinks or equivalent per week    Comment: half a pint of liquor every week   Drug use: No   Sexual activity: Not Currently    Birth control/protection: Surgical  Other Topics Concern   Not on file  Social History Narrative   Lives at home by herself, independent at baseline   Social Determinants of Health   Financial Resource Strain: Low Risk  (04/15/2021)   Overall Financial Resource Strain (CARDIA)    Difficulty of Paying Living Expenses: Not hard at all  Food Insecurity: No Food Insecurity (04/15/2021)   Hunger Vital Sign    Worried About Running Out of Food in the Last Year: Never true    Ran Out of Food in the Last Year: Never true  Transportation Needs: No Transportation Needs (04/15/2021)   PRAPARE - Hydrologist (Medical): No    Lack of Transportation (Non-Medical): No  Physical Activity: Sufficiently Active (04/15/2021)   Exercise Vital Sign    Days of Exercise per Week: 5 days    Minutes of Exercise per Session: 30 min  Stress: No Stress Concern Present (04/15/2021)   [place]     Feeling of Stress : Not at all  Social Connections: Moderately Isolated (04/15/2021)   Social Connection and Isolation Panel [NHANES]    Frequency of Communication with Friends and Family: More than three times a week    Frequency of Social Gatherings with Friends and Family: More than three times a week    Attends Religious Services: More than 4 times per year    Active Member of [employer] or Organizations: No    Attends Archivist Meetings: Never    Marital Status: Divorced  Human resources officer Violence: Not At Risk (04/15/2021)   Humiliation, Afraid, Rape, and Kick questionnaire    Fear of Current or Ex-Partner: No    Emotionally Abused: No    Physically Abused: No    Sexually Abused: No    Family History  Problem Relation Age of Onset   Heart disease Mother 61  MI   Heart attack Mother 7   Diabetes Mother    Pancreatic cancer Sister    Ovarian cancer Sister    Breast cancer Neg Hx      Current Outpatient Medications:    acetaminophen (TYLENOL) 325 MG tablet, Take 650 mg by mouth every 6 (six) hours as needed., Disp: , Rfl:    amLODipine (NORVASC) 5 MG tablet, Take 1 tablet (5 mg total) by mouth daily., Disp: 90 tablet, Rfl: 1   aspirin EC 81 MG tablet, Take 1 tablet (81 mg total) by mouth daily., Disp: , Rfl:    Calcium Carbonate-Vitamin D (CALCIUM 600/VITAMIN D PO), Take 1 tablet by mouth daily., Disp: , Rfl:    cholecalciferol (VITAMIN D3) 25 MCG (1000 UNIT) tablet, Take 1,000 Units by mouth daily., Disp: , Rfl:    diclofenac Sodium (VOLTAREN ARTHRITIS PAIN) 1 % GEL, Apply topically 4 (four) times daily., Disp: , Rfl:    hydrochlorothiazide (MICROZIDE) 12.5 MG capsule, Take 1 capsule (12.5 mg total) by mouth daily., Disp: 90 capsule, Rfl: 3   rosuvastatin (CRESTOR) 5 MG tablet, TAKE 1 TABLET(5 MG) BY MOUTH AT BEDTIME, Disp: 90 tablet, Rfl: 0  Physical exam:  Vitals:   09/21/21 0909  BP: (!) 162/92  Pulse: 68  Resp: 18  Temp: (!) 96.9 F (36.1 C)  SpO2:  100%  Weight: 177 lb 14.4 oz (80.7 kg)   Physical Exam Constitutional:      General: She is not in acute distress. Cardiovascular:     Rate and Rhythm: Normal rate and regular rhythm.     Heart sounds: Normal heart sounds.  Pulmonary:     Effort: Pulmonary effort is normal.     Breath sounds: Normal breath sounds.  Abdominal:     General: Bowel sounds are normal.     Palpations: Abdomen is soft.  Skin:    General: Skin is warm and dry.  Neurological:     General: No focal deficit present.     Mental Status: She is alert and oriented to person, place, and time.     Motor: No weakness.     Gait: Gait normal.         Latest Ref Rng & Units 09/21/2021    8:49 AM  CMP  Glucose 70 - 99 mg/dL 94   BUN 8 - 23 mg/dL 17   Creatinine 0.44 - 1.00 mg/dL 0.80   Sodium 135 - 145 mmol/L 138   Potassium 3.5 - 5.1 mmol/L 3.7   Chloride 98 - 111 mmol/L 105   CO2 22 - 32 mmol/L 26   Calcium 8.9 - 10.3 mg/dL 10.2   Total Protein 6.5 - 8.1 g/dL 7.7   Total Bilirubin 0.3 - 1.2 mg/dL 0.5   Alkaline Phos 38 - 126 U/L 53   AST 15 - 41 U/L 32   ALT 0 - 44 U/L 27       Latest Ref Rng & Units 09/21/2021    8:49 AM  CBC  WBC 4.0 - 10.5 K/uL 5.6   Hemoglobin 12.0 - 15.0 g/dL 14.4   Hematocrit 36.0 - 46.0 % 44.2   Platelets 150 - 400 K/uL 119     No images are attached to the encounter.  CT Chest Wo Contrast  Result Date: 09/17/2021 CLINICAL DATA:  Right upper lobe lung cancer status post right upper lobectomy in January 2020. Restaging. * Tracking Code: BO * EXAM: CT CHEST WITHOUT CONTRAST TECHNIQUE: Multidetector CT imaging of the  chest was performed following the standard protocol without IV contrast. RADIATION DOSE REDUCTION: This exam was performed according to the departmental dose-optimization program which includes automated exposure control, adjustment of the mA and/or kV according to patient size and/or use of iterative reconstruction technique. COMPARISON:  03/23/2021 chest CT.  FINDINGS: Cardiovascular: Normal heart size. No significant pericardial effusion/thickening. Three-vessel coronary atherosclerosis. Atherosclerotic nonaneurysmal thoracic aorta. Normal caliber pulmonary arteries. Mediastinum/Nodes: Multiple right thyroid nodules, with dominant hypodense 1.8 cm right thyroid nodule, unchanged using similar measurement technique. Unremarkable esophagus. No pathologically enlarged axillary, mediastinal or hilar lymph nodes, noting limited sensitivity for the detection of hilar adenopathy on this noncontrast study. Stable top-normal 0.8 cm right pericardiophrenic node (series 2/image 90). Lungs/Pleura: No pneumothorax. No pleural effusion. Status post right upper lobectomy. Mild centrilobular emphysema. No acute consolidative airspace disease or lung masses. Solid posterior left upper lobe 0.9 cm pulmonary nodule (series 3/image 67), increased from 0.7 cm on 03/23/2021 chest CT, 0.6 cm on 09/14/2020 chest CT, 0.5 cm on 02/24/2020 chest CT and 0.4 cm on 08/07/2019 chest CT. No new significant pulmonary nodules. Upper abdomen: Small hiatal hernia. Simple anterior left liver 1.7 cm cyst. Simple anterior right liver dome 2.0 cm cyst. Musculoskeletal: No aggressive appearing focal osseous lesions. Moderate thoracic spondylosis. IMPRESSION: 1. Solid posterior left upper lobe 0.9 cm pulmonary nodule, which has demonstrated consistent slow growth on numerous chest CT studies back to 08/07/2019, suspicious for slow growing malignancy. Differential includes metachronous primary bronchogenic malignancy versus contralateral pulmonary metastasis. 2. No thoracic adenopathy or other findings of metastatic disease in the chest. 3. Three-vessel coronary atherosclerosis. 4. Small hiatal hernia. 5. Aortic Atherosclerosis (ICD10-I70.0) and Emphysema (ICD10-J43.9). Electronically Signed   By: Ilona Sorrel M.D.   On: 09/17/2021 19:30     Assessment and plan- Patient is a 71 y.o. female with stage Ib  adenocarcinoma of the right upper lobe pT2 apN0 cM0 status post right upper lobectomy.  She is here for CT scan results and further management  Reviewed CT chest images independently and discussed findings with the patient which showsStable changes in the right upper lobe s/p lobectomy.  She has had a left upper lobe lung nodule at least since 2021 and had remained stable between 4 to 5 mm Until July of last year.  Since then it has gradually increased in size and presently at 0.9 cm.  Given the slow but consistent growth it is concerning for another primary bronchogenic malignancy.  I will discuss her case at tumor board next week to see if we can go ahead with a PET scan followed by biopsy versus presumptive radiation treatment for the left upper lobe.  I will call the patient after we discussed her case at tumor board  Low back pain: Appears musculoskeletal.  Will start off with plain x-rays of the back.  If pain continues she will talk to her primary care doctor Parks Ranger to see if further imaging such as MRI is warranted   Visit Diagnosis 1. Encounter for follow-up surveillance of lung cancer   2. Low back pain, non-specific   3. Nodule of upper lobe of left lung      Dr. Randa Evens, MD, MPH [facility] [phone] 09/21/2021 12:37 PM

## 2021-09-21 NOTE — Progress Notes (Signed)
Pt states since Sunday she has been experiencing lower back pain that radiates to her thigh causing difficulty to do daily acitivities. BP has been running high to due pain. Pain level: 8/10. Ha sonly tried [employer] and does not seem to help.

## 2021-09-21 NOTE — Telephone Encounter (Signed)
I called pt this afternoon and no answer and no voicemail set up but then she called back and I told her that she did not have a fracture and that is good but she does have abnormal narrowing and Dr. Janese Banks has sent the results to her PCP Dr. Jerene Pitch to f/u with pt and determine if ortho ref is needed. Pt understands and she will check with PCP

## 2021-09-21 NOTE — Telephone Encounter (Signed)
-----   Message from Sindy Guadeloupe, MD sent at 09/21/2021  4:27 PM EDT ----- Dr. Parks Ranger- I saw her for lung ca surveillance today and she was complaining of back pain. FYI. Can you f/u with her and conisder ortho referral?  Judeen Hammans- please let her know- no fracture but disc space is narrowed between L4-L5 which can explain her pain  Thank you, Astrid Divine

## 2021-09-28 ENCOUNTER — Ambulatory Visit: Payer: Medicare Other | Admitting: Pharmacist

## 2021-09-28 DIAGNOSIS — I10 Essential (primary) hypertension: Secondary | ICD-10-CM

## 2021-09-28 NOTE — Chronic Care Management (AMB) (Signed)
  Chronic Care Management   Outreach Note  09/28/2021 Name: JAELYNNE HOCKLEY MRN: [id number] DOB: January 08, 1951  I connected with Phylliss Blakes Fiorito on 09/28/21 by telephone outreach and verified that I am speaking with the correct person using two identifiers.  Patient appeared on report for [place] Metric Hypertension Control due to previous documented ambulatory blood pressure of 179/90 on 03/25/2021.   Patient last seen by PCP on 06/28/2021. Provider advised patient to Start amlodipine 5 mg daily, while continuing current dose of HCTZ 12.5 mg daily.   Outreach to patient today as previously scheduled to follow up regarding hypertension monitoring/management.    Today patient declines to discuss blood pressure. States she is in pain from her back and is expecting a call back from PCP office regarding results of her X-ray from 7/19. From review of chart, note patient seen for Office Visit with [facility] on 7/19 related to routine follow up of lung cancer. During appointment, patient reported ongoing low back pain for the prior 2 weeks. X-ray completed on 7/19 Will send message to PCP today. Also advise patient to follow up with PCP office   Follow Up Plan: CM Pharmacist will outreach to patient by telephone again within the next 30 days  Wallace Cullens, PharmD, [facility] [phone]

## 2021-09-29 ENCOUNTER — Other Ambulatory Visit: Payer: Medicare Other

## 2021-09-29 NOTE — Progress Notes (Signed)
Tumor Board Documentation  Ronia Hazelett Cosper was presented by Marquis Lunch, PA at our Tumor Board on 09/29/2021, which included representatives from medical oncology, surgical, pharmacy, genetics, radiology, pathology, radiation oncology, navigation, research, internal medicine.  Maysen currently presents as a current patient, for discussion with history of the following treatments: surgical intervention(s), active survellience.  Additionally, we reviewed previous medical and familial history, history of present illness, and recent lab results along with all available histopathologic and imaging studies. The tumor board considered available treatment options and made the following recommendations: Biopsy, Radiation therapy (primary modality) Refer to Dr Patsey Berthold for possible Navigational Bronch for Biopsy, If unable to do Bronch, refer to Dr Baruch Gouty for SBRT  The following procedures/referrals were also placed: No orders of the defined types were placed in this encounter.   Clinical Trial Status: not discussed   Staging used: To be determined AJCC Staging:       Group: Lung nodule, hisotory of Lung Cancer   National site-specific guidelines   were discussed with respect to the case.  Tumor board is a meeting of clinicians from various specialty areas who evaluate and discuss patients for whom a multidisciplinary approach is being considered. Final determinations in the plan of care are those of the provider(s). The responsibility for follow up of recommendations given during tumor board is that of the provider.   Today's extended care, comprehensive team conference, Madissen was not present for the discussion and was not examined.   Multidisciplinary Tumor Board is a multidisciplinary case peer review process.  Decisions discussed in the Multidisciplinary Tumor Board reflect the opinions of the specialists present at the conference without having examined the patient.  Ultimately, treatment  and diagnostic decisions rest with the primary provider(s) and the patient.

## 2021-09-30 ENCOUNTER — Telehealth: Payer: Self-pay | Admitting: *Deleted

## 2021-09-30 ENCOUNTER — Other Ambulatory Visit: Payer: Self-pay | Admitting: *Deleted

## 2021-09-30 DIAGNOSIS — R911 Solitary pulmonary nodule: Secondary | ICD-10-CM

## 2021-09-30 DIAGNOSIS — C3491 Malignant neoplasm of unspecified part of right bronchus or lung: Secondary | ICD-10-CM

## 2021-09-30 NOTE — Telephone Encounter (Signed)
Called patient today to talk to her about the last scan she had.  Dr. Janese Banks had said that she had had a left upper lobe lung nodule for several years since 2021 appeared that it looked bigger on the scan most recently done.  Because the patient has had lung cancer before and the nodule in the other lung is getting bigger she took the information and talked about her scan and tumor conference.  Dr. Janese Banks and the team decided that it would be best if patient can get a biopsy of that nodule to make sure that it is not cancer.  Also she wanted me to put in a referral for radiation also.  It could be that the it is lung cancer but the biopsy would let us know and if for some reason that Dr. Patsey Berthold which is the referral doctor that we sent her.  If it comes to be that we cannot get to the nodule then we would give radiation.  Dr. Janese Banks did not want to wait she wanted to have everything done as soon as possible so we do not waste any time.  Patient is agreeable to this plan.  Also Dr. Janese Banks wants to go ahead and put a 3-1/74-month CT scan in to monitor her.  Patient is aware of that and agreeable to

## 2021-10-03 ENCOUNTER — Ambulatory Visit (INDEPENDENT_AMBULATORY_CARE_PROVIDER_SITE_OTHER): Payer: Medicare Other | Admitting: Family Medicine

## 2021-10-03 ENCOUNTER — Encounter: Payer: Self-pay | Admitting: Family Medicine

## 2021-10-03 VITALS — BP 144/73 | HR 89 | Ht 61.0 in | Wt 176.0 lb

## 2021-10-03 DIAGNOSIS — M5441 Lumbago with sciatica, right side: Secondary | ICD-10-CM | POA: Diagnosis not present

## 2021-10-03 MED ORDER — PREDNISONE 20 MG PO TABS: ORAL_TABLET | ORAL | 0 refills | Status: DC

## 2021-10-03 MED ORDER — NAPROXEN 500 MG PO TABS: 500.0000 mg | ORAL_TABLET | Freq: Two times a day (BID) | ORAL | 2 refills | Status: DC

## 2021-10-03 MED ORDER — GABAPENTIN 100 MG PO CAPS: ORAL_CAPSULE | ORAL | 1 refills | Status: DC

## 2021-10-03 NOTE — Patient Instructions (Addendum)
Thank you for coming to the office today.  1. For your Back Pain - I think that this is due to Muscle Spasms or strain. Your Sciatic Nerve can be affected causing some of your radiation and numbness down your legs.  Start Prednisone taper 7 days. Take with food. Do NOT take with ibuprofen advil aleve naproxen. Once this is done then you can start the Naproxen >>> Start with anti-inflammatory Naprosyn (Naproxen) 500mg  twice daily (12 hrs apart, with food, breakfast and dinner) every day for next 1 to 2 weeks if helping, then can use only as needed  Start Gabapentin 100mg  capsules, take at night for 2-3 nights only, and then increase to 2 times a day for a few days, and then may increase to 3 times a day, it may make you drowsy, if helps significantly at night only, then you can increase instead to 3 capsules at night, instead of 3 times a day - In the future if needed, we can significantly increase the dose if tolerated well, some common doses are 300mg  three times a day up to 600mg  three times a day, usually it takes several weeks or months to get to higher doses  4. May use Tylenol Extra Str 500mg  tabs - may take 1-2 tablets every 6 hours as needed 5. Recommend to start using heating pad on your lower back 1-2x daily for few weeks  Also try a Wedge Seat Cushion to avoid nerve pinching when sitting prolonged period of time.  This pain may take weeks to months to fully resolve, but hopefully it will respond to the medicine initially. All back injuries (small or serious) are slow to heal since we use our back muscles every day. Be careful with turning, twisting, lifting, sitting / standing for prolonged periods, and avoid re-injury.  If your symptoms significantly worsen with more pain, or new symptoms with weakness in one or both legs, new or different shooting leg pains, numbness in legs or groin, loss of control or retention of urine or bowel movements, please call back for advice and you may need  to go directly to the Emergency Department.  Please schedule a Follow-up Appointment to: Return in about 4 weeks (around 10/31/2021), or if symptoms worsen or fail to improve, for 4 weeks back pain if not improved.  If you have any other questions or concerns, please feel free to call the office or send a message through [facility]. You may also schedule an earlier appointment if necessary.  Additionally, you may be receiving a survey about your experience at our office within a few days to 1 week by e-mail or mail. We value your feedback.  Nobie Putnam, DO [facility], [facility]             Low Back Pain Exercises  See other page with pictures of each exercise.  Start with 1 or 2 of these exercises that you are most comfortable with. Do not do any exercises that cause you significant worsening pain. Some of these may cause some "stretching soreness" but it should go away after you stop the exercise, and get better over time. Gradually increase up to 3-4 exercises as tolerated.  Standing hamstring stretch: Place the heel of your leg on a stool about 15 inches high. Keep your knee straight. Lean forward, bending at the hips until you feel a mild stretch in the back of your thigh. Make sure you do not roll your shoulders and bend at the waist  when doing this or you will stretch your lower back instead. Hold the stretch for 15 to 30 seconds. Repeat 3 times. Repeat the same stretch on your other leg.  Cat and camel: Get down on your hands and knees. Let your stomach sag, allowing your back to curve downward. Hold this position for 5 seconds. Then arch your back and hold for 5 seconds. Do 3 sets of 10.  Quadriped Arm/Leg Raises: Get down on your hands and knees. Tighten your abdominal muscles to stiffen your spine. While keeping your abdominals tight, raise one arm and the opposite leg away from you. Hold this position for 5 seconds. Lower your arm and leg slowly and  alternate sides. Do this 10 times on each side.  Pelvic tilt: Lie on your back with your knees bent and your feet flat on the floor. Tighten your abdominal muscles and push your lower back into the floor. Hold this position for 5 seconds, then relax. Do 3 sets of 10.  Partial curl: Lie on your back with your knees bent and your feet flat on the floor. Tighten your stomach muscles and flatten your back against the floor. Tuck your chin to your chest. With your hands stretched out in front of you, curl your upper body forward until your shoulders clear the floor. Hold this position for 3 seconds. Don't hold your breath. It helps to breathe out as you lift your shoulders up. Relax. Repeat 10 times. Build to 3 sets of 10. To challenge yourself, clasp your hands behind your head and keep your elbows out to the side.  Lower trunk rotation: Lie on your back with your knees bent and your feet flat on the floor. Tighten your abdominal muscles and push your lower back into the floor. Keeping your shoulders down flat, gently rotate your legs to one side, then the other as far as you can. Repeat 10 to 20 times.  Single knee to chest stretch: Lie on your back with your legs straight out in front of you. Bring one knee up to your chest and grasp the back of your thigh. Pull your knee toward your chest, stretching your buttock muscle. Hold this position for 15 to 30 seconds and return to the starting position. Repeat 3 times on each side.  Double knee to chest: Lie on your back with your knees bent and your feet flat on the floor. Tighten your abdominal muscles and push your lower back into the floor. Pull both knees up to your chest. Hold for 5 seconds and repeat 10 to 20 times.

## 2021-10-03 NOTE — Progress Notes (Signed)
Subjective:    Patient ID: Joanna Morris, female    DOB: 1951/02/13, 71 y.o.   MRN: [id number]  Joanna Morris is a 71 y.o. female presenting on 10/03/2021 for Results (Back Xray, still having back pain, tylenol helps some)   HPI  Low Back Pain, Right sided, Sciatica  Followed by Dr Joanna Morris [facility], for lung nodule, has had imaging and recently Lumbar Spine X-ray showed degenerative arthritis DDD lumbar spine L4-L5 and bone spurs. She was asked to follow up with me here.  Taking Tylenol 500 mg x 2 = TID dosing approximately  Now bothering more often, seems she had fall from it within past 2 weeks and R sided sciatica radiating symptoms into waist groin and leg, with numbness paresthesia pain  Prior history of arthritis with knees. No prior management on her back.       10/03/2021    9:46 AM 06/28/2021    8:35 AM 04/15/2021    8:27 AM  Depression screen PHQ 2/9  Decreased Interest 0 0 0  Down, Depressed, Hopeless 0 0 0  PHQ - 2 Score 0 0 0  Altered sleeping 0 0   Tired, decreased energy 0 0   Change in appetite 0 0   Feeling bad or failure about yourself  0 0   Trouble concentrating 0 0   Moving slowly or fidgety/restless 0 0   Suicidal thoughts 0 0   PHQ-9 Score 0 0   Difficult doing work/chores Not difficult at all Not difficult at all     Social History   Tobacco Use   Smoking status: Every Day    Packs/day: 0.50    Years: 51.00    Total pack years: 25.50    Types: Cigarettes   Smokeless tobacco: Never   Tobacco comments:    Quit once for 2 years. pt using nicorette gum   Vaping Use   Vaping Use: Never used  Substance Use Topics   Alcohol use: Not Currently    Alcohol/week: 1.0 - 3.0 standard drink of alcohol    Types: 1 - 3 Standard drinks or equivalent per week    Comment: half a pint of liquor every week   Drug use: No    Review of Systems Per HPI unless specifically indicated above     Objective:    BP (!) 144/73   Pulse 89   Ht 5\' 1"   (1.549 m)   Wt 176 lb (79.8 kg)   SpO2 97%   BMI 33.25 kg/m   Wt Readings from Last 3 Encounters:  10/03/21 176 lb (79.8 kg)  09/21/21 177 lb 14.4 oz (80.7 kg)  06/28/21 179 lb 12.8 oz (81.6 kg)    Physical Exam Vitals and nursing note reviewed.  Constitutional:      General: She is not in acute distress.    Appearance: Normal appearance. She is well-developed. She is not diaphoretic.     Comments: Well-appearing, comfortable, cooperative  HENT:     Head: Normocephalic and atraumatic.  Eyes:     General:        Right eye: No discharge.        Left eye: No discharge.     Conjunctiva/sclera: Conjunctivae normal.  Cardiovascular:     Rate and Rhythm: Normal rate.  Pulmonary:     Effort: Pulmonary effort is normal.  Musculoskeletal:     Comments: R low back pain, straight leg raise with reproduced pain  Skin:  General: Skin is warm and dry.     Findings: No erythema or rash.  Neurological:     Mental Status: She is alert and oriented to person, place, and time.  Psychiatric:        Mood and Affect: Mood normal.        Behavior: Behavior normal.        Thought Content: Thought content normal.     Comments: Well groomed, good eye contact, normal speech and thoughts     I have personally reviewed the radiology report from 09/21/21 on Lumbar X-ray.   CLINICAL DATA:  Low back pain, pain right lower extremity   EXAM: LUMBAR SPINE - 2-3 VIEW   COMPARISON:  None   FINDINGS: No recent fracture is seen. Alignment of the posterior margins of vertebral bodies is unremarkable. Degenerative changes are noted with bony spurs, disc space narrowing and facet hypertrophy at multiple levels, more so at L4-L5 level. There are scattered arterial calcifications.   IMPRESSION: No recent fracture is seen in the lumbar spine. Lumbar spondylosis, more severe at L4-L5 level.     Electronically Signed   By: Joanna Morris M.D.   On: 09/21/2021 16:13  Results for orders  placed or performed in visit on 09/21/21  Comprehensive metabolic panel  Result Value Ref Range   Sodium 138 135 - 145 mmol/L   Potassium 3.7 3.5 - 5.1 mmol/L   Chloride 105 98 - 111 mmol/L   CO2 26 22 - 32 mmol/L   Glucose, Bld 94 70 - 99 mg/dL   BUN 17 8 - 23 mg/dL   Creatinine, Ser 0.80 0.44 - 1.00 mg/dL   Calcium 10.2 8.9 - 10.3 mg/dL   Total Protein 7.7 6.5 - 8.1 g/dL   Albumin 3.4 (L) 3.5 - 5.0 g/dL   AST 32 15 - 41 U/L   ALT 27 0 - 44 U/L   Alkaline Phosphatase 53 38 - 126 U/L   Total Bilirubin 0.5 0.3 - 1.2 mg/dL   GFR, Estimated >60 >60 mL/min   Anion gap 7 5 - 15  CBC with Differential  Result Value Ref Range   WBC 5.6 4.0 - 10.5 K/uL   RBC 4.80 3.87 - 5.11 MIL/uL   Hemoglobin 14.4 12.0 - 15.0 g/dL   HCT 44.2 36.0 - 46.0 %   MCV 92.1 80.0 - 100.0 fL   MCH 30.0 26.0 - 34.0 pg   MCHC 32.6 30.0 - 36.0 g/dL   RDW 12.1 11.5 - 15.5 %   Platelets 119 (L) 150 - 400 K/uL   nRBC 0.0 0.0 - 0.2 %   Neutrophils Relative % 59 %   Neutro Abs 3.3 1.7 - 7.7 K/uL   Lymphocytes Relative 30 %   Lymphs Abs 1.7 0.7 - 4.0 K/uL   Monocytes Relative 9 %   Monocytes Absolute 0.5 0.1 - 1.0 K/uL   Eosinophils Relative 1 %   Eosinophils Absolute 0.0 0.0 - 0.5 K/uL   Basophils Relative 1 %   Basophils Absolute 0.0 0.0 - 0.1 K/uL   Immature Granulocytes 0 %   Abs Immature Granulocytes 0.01 0.00 - 0.07 K/uL      Assessment & Plan:   Problem List Items Addressed This Visit   None Visit Diagnoses     Acute right-sided low back pain with right-sided sciatica    -  Primary   Relevant Medications   gabapentin (NEURONTIN) 100 MG capsule   predniSONE (DELTASONE) 20 MG tablet  naproxen (NAPROSYN) 500 MG tablet       No orders of the defined types were placed in this encounter.  Acute on chronic R LBP with associated R sciatica. Suspect likely due to muscle spasm/strain, without known injury or trauma.  In setting of known chronic LBP with DJD No prior surgery. - Inadequate  conservative therapy   X-ray lumbar reviewed, from [place]: 1. Will pursue medical management today and follow up. - Start Prednisone taper 7 days, then after finish Start anti-inflammatory trial with rx Naprosyn 500mg  BID wc x 1-2 weeks, then PRN 2. Gabapentin titration, use QHS prefer adjust dose as tolerated 3. Continue to use Tylenol PRN for breakthrough 4. Encouraged use of heating pad 1-2x daily for now then PRN 5.  Follow-up 4-6 weeks if not improved for re-evaluation, consider PT vs Ortho    Meds ordered this encounter  Medications   gabapentin (NEURONTIN) 100 MG capsule    Sig: Start 1 capsule daily, increase by 1 cap every 2-3 days as tolerated up to 3 times a day, or may take 3 at once in evening.    Dispense:  90 capsule    Refill:  1   predniSONE (DELTASONE) 20 MG tablet    Sig: Take daily with food. Start with 60mg  (3 pills) x 2 days, then reduce to 40mg  (2 pills) x 2 days, then 20mg  (1 pill) x 3 days    Dispense:  13 tablet    Refill:  0   naproxen (NAPROSYN) 500 MG tablet    Sig: Take 1 tablet (500 mg total) by mouth 2 (two) times daily with a meal. For 1-2 weeks then as needed    Dispense:  60 tablet    Refill:  2      Follow up plan: Return in about 4 weeks (around 10/31/2021), or if symptoms worsen or fail to improve, for 4 weeks back pain if not improved.   Nobie Putnam, [facility] 10/03/2021, 9:57 AM

## 2021-10-07 ENCOUNTER — Institutional Professional Consult (permissible substitution): Payer: Medicare Other | Admitting: Pulmonary Disease

## 2021-10-10 ENCOUNTER — Encounter: Payer: Self-pay | Admitting: Radiation Oncology

## 2021-10-10 ENCOUNTER — Telehealth: Payer: Self-pay | Admitting: *Deleted

## 2021-10-10 ENCOUNTER — Ambulatory Visit
Admission: RE | Admit: 2021-10-10 | Discharge: 2021-10-10 | Disposition: A | Discharge status: Home / Self Care | Payer: Medicare Other | Source: Ambulatory Visit | Attending: Radiation Oncology | Admitting: Radiation Oncology

## 2021-10-10 VITALS — BP 143/85 | HR 76 | Temp 97.5°F | Resp 16 | Ht 61.0 in | Wt 171.9 lb

## 2021-10-10 DIAGNOSIS — K219 Gastro-esophageal reflux disease without esophagitis: Secondary | ICD-10-CM | POA: Insufficient documentation

## 2021-10-10 DIAGNOSIS — E785 Hyperlipidemia, unspecified: Secondary | ICD-10-CM | POA: Insufficient documentation

## 2021-10-10 DIAGNOSIS — I10 Essential (primary) hypertension: Secondary | ICD-10-CM | POA: Diagnosis not present

## 2021-10-10 DIAGNOSIS — C3491 Malignant neoplasm of unspecified part of right bronchus or lung: Secondary | ICD-10-CM

## 2021-10-10 DIAGNOSIS — R911 Solitary pulmonary nodule: Secondary | ICD-10-CM | POA: Insufficient documentation

## 2021-10-10 DIAGNOSIS — F17210 Nicotine dependence, cigarettes, uncomplicated: Secondary | ICD-10-CM | POA: Insufficient documentation

## 2021-10-10 DIAGNOSIS — Z87442 Personal history of urinary calculi: Secondary | ICD-10-CM | POA: Diagnosis not present

## 2021-10-10 DIAGNOSIS — C3412 Malignant neoplasm of upper lobe, left bronchus or lung: Secondary | ICD-10-CM | POA: Diagnosis not present

## 2021-10-10 NOTE — Consult Note (Signed)
NEW PATIENT EVALUATION  Name: Joanna Morris  MRN: [id number]  Date:   10/10/2021     DOB: Feb 10, 1951   This 71 y.o. female patient presents to the clinic for initial evaluation of probable stage I non-small cell lung cancer of the left upper lobe and patient previously treated.  With a right upper lobectomy in 2020 for 2.5 cm adenocarcinoma  REFERRING PHYSICIAN: Nobie Putnam *  CHIEF COMPLAINT:  Chief Complaint  Patient presents with   Lung Cancer    DIAGNOSIS: The encounter diagnosis was Primary adenocarcinoma of right lung ([place]).   PREVIOUS INVESTIGATIONS:  CT scans reviewed Clinical notes reviewed  HPI: Patient is a 71 year old female with a history of right upper lobectomy back in 2020 for a 2.7 cm adenocarcinoma with micropapular predominant with visceral pleural invasion.  Patient chose not to proceed with chemotherapy.  They have been following a left upper lobe mass which has December this demonstrated consistent low some slow growth on the repeat recent studies now measures 0.9 cm no thoracic adenopathy or other findings of metastatic disease in the chest were noted.  She has not yet have a PET scan or evaluation by pulmonology for possible navigational bronchoscopy.  She is asymptomatic.  Specifically denies cough hemoptysis or chest tightness.  PLANNED TREATMENT REGIMEN: Work-up including PET scan as well as possible navigational bronchoscopy.  Should this be positive would offer SBRT  PAST MEDICAL HISTORY:  has a past medical history of Allergy, Cataract, Genital warts, GERD (gastroesophageal reflux disease), History of kidney stones, Hyperlipidemia, Hypertension, Lung nodule, Primary cancer of right lung ([place]), and Tobacco dependency.    PAST SURGICAL HISTORY:  Past Surgical History:  Procedure Laterality Date   ABDOMINAL HYSTERECTOMY     CESAREAN SECTION     X2   KNEE ARTHROSCOPY WITH MEDIAL MENISECTOMY Right 05/22/2016   Procedure: KNEE ARTHROSCOPY WITH  PARTIAL MEDIAL MENISECTOMY;  Surgeon: Dereck Leep, MD;  Location: [facility] ORS;  Service: Orthopedics;  Laterality: Right;   OOPHORECTOMY     THORACOTOMY Right 04/02/2018   Procedure: THORACOTOMY AND LUNG RESECTION WITH PREOP BRONCH;  Surgeon: Nestor Lewandowsky, MD;  Location: [facility] ORS;  Service: General;  Laterality: Right;    FAMILY HISTORY: family history includes Diabetes in her mother; Heart attack (age of onset: 87) in her mother; Heart disease (age of onset: 51) in her mother; Ovarian cancer in her sister; Pancreatic cancer in her sister.  SOCIAL HISTORY:  reports that she has been smoking cigarettes. She has a 25.50 pack-year smoking history. She has never used smokeless tobacco. She reports that she does not currently use alcohol after a past usage of about 1.0 - 3.0 standard drink of alcohol per week. She reports that she does not use drugs.  ALLERGIES: Tramadol  MEDICATIONS:  Current Outpatient Medications  Medication Sig Dispense Refill   acetaminophen (TYLENOL) 325 MG tablet Take 650 mg by mouth every 6 (six) hours as needed.     amLODipine (NORVASC) 5 MG tablet Take 1 tablet (5 mg total) by mouth daily. 90 tablet 1   aspirin EC 81 MG tablet Take 1 tablet (81 mg total) by mouth daily.     Calcium Carbonate-Vitamin D (CALCIUM 600/VITAMIN D PO) Take 1 tablet by mouth daily.     cholecalciferol (VITAMIN D3) 25 MCG (1000 UNIT) tablet Take 1,000 Units by mouth daily.     diclofenac Sodium (VOLTAREN ARTHRITIS PAIN) 1 % GEL Apply topically 4 (four) times daily.     gabapentin (NEURONTIN)  100 MG capsule Start 1 capsule daily, increase by 1 cap every 2-3 days as tolerated up to 3 times a day, or may take 3 at once in evening. 90 capsule 1   hydrochlorothiazide (MICROZIDE) 12.5 MG capsule Take 1 capsule (12.5 mg total) by mouth daily. 90 capsule 3   naproxen (NAPROSYN) 500 MG tablet Take 1 tablet (500 mg total) by mouth 2 (two) times daily with a meal. For 1-2 weeks then as needed 60 tablet 2    predniSONE (DELTASONE) 20 MG tablet Take daily with food. Start with 60mg  (3 pills) x 2 days, then reduce to 40mg  (2 pills) x 2 days, then 20mg  (1 pill) x 3 days 13 tablet 0   rosuvastatin (CRESTOR) 5 MG tablet TAKE 1 TABLET(5 MG) BY MOUTH AT BEDTIME 90 tablet 0   No current facility-administered medications for this encounter.    ECOG PERFORMANCE STATUS:  0 - Asymptomatic  REVIEW OF SYSTEMS: Patient denies any weight loss, fatigue, weakness, fever, chills or night sweats. Patient denies any loss of vision, blurred vision. Patient denies any ringing  of the ears or hearing loss. No irregular heartbeat. Patient denies heart murmur or history of fainting. Patient denies any chest pain or pain radiating to her upper extremities. Patient denies any shortness of breath, difficulty breathing at night, cough or hemoptysis. Patient denies any swelling in the lower legs. Patient denies any nausea vomiting, vomiting of blood, or coffee ground material in the vomitus. Patient denies any stomach pain. Patient states has had normal bowel movements no significant constipation or diarrhea. Patient denies any dysuria, hematuria or significant nocturia. Patient denies any problems walking, swelling in the joints or loss of balance. Patient denies any skin changes, loss of hair or loss of weight. Patient denies any excessive worrying or anxiety or significant depression. Patient denies any problems with insomnia. Patient denies excessive thirst, polyuria, polydipsia. Patient denies any swollen glands, patient denies easy bruising or easy bleeding. Patient denies any recent infections, allergies or URI. Patient "s visual fields have not changed significantly in recent time.   PHYSICAL EXAM: BP (!) 143/85 (BP Location: Right Arm, Patient Position: Sitting, Cuff Size: Normal)   Pulse 76   Temp (!) 97.5 F (36.4 C) (Tympanic)   Resp 16   Ht 5\' 1"  (1.549 m) Comment: stated ht  Wt 171 lb 14.4 oz (78 kg)   BMI 32.48 kg/m   Well-developed well-nourished patient in NAD. HEENT reveals PERLA, EOMI, discs not visualized.  Oral cavity is clear. No oral mucosal lesions are identified. Neck is clear without evidence of cervical or supraclavicular adenopathy. Lungs are clear to A&P. Cardiac examination is essentially unremarkable with regular rate and rhythm without murmur rub or thrill. Abdomen is benign with no organomegaly or masses noted. Motor sensory and DTR levels are equal and symmetric in the upper and lower extremities. Cranial nerves II through XII are grossly intact. Proprioception is intact. No peripheral adenopathy or edema is identified. No motor or sensory levels are noted. Crude visual fields are within normal range.  LABORATORY DATA: Possible navigation bronchoscopy to be arranged    RADIOLOGY RESULTS: CT scans reviewed PET CT scan ordered   IMPRESSION: Probable stage I non-small cell lung cancer left upper lobe in 71 year old female with prior history of lung cancer status post lobectomy  PLAN: At this time I have ordered a PET CT scan.  Of also 6 asked for consultation with Dr. Patsey Berthold for possible navigational bronchoscopy.  We will make  determination based on those 2 evaluations.  My feeling is to offer SBRT should this proved to be a non-small cell lung cancer.  Would offer 60 Gray in 5 fractions.  Risks and benefits of SBRT including slow extremely low side effect profile were discussed with the patient.  She comprehends my recommendations well.  We will see her back in 3 weeks and review her results.  I would like to take this opportunity to thank you for allowing me to participate in the care of your patient.Noreene Filbert, MD

## 2021-10-10 NOTE — Telephone Encounter (Signed)
I called back to radiation and said that the pt was going to get bx from dr Patsey Berthold and she would set one up. In looking at the appts. She had appt with Dr.Gonzalez 8/4 and when they called pt she cancelled the appt. I called back to Dr. Patsey Berthold office and they had a cancellation for this Thursday at 10 am. I called the pt. Several times and she answered and she said to cancel it  , and I told her that it is hard to get appts and this one just came up because someone cancelled and she will not have another one for a few weeks. Pt said to cancel it. She works early hours and she needs early appts. I tried to tell her that it will not be able to get done because we need it quickly. She told me to do it and the next appt is October 10 at 8:30.

## 2021-10-13 ENCOUNTER — Institutional Professional Consult (permissible substitution): Payer: Medicare Other | Admitting: Pulmonary Disease

## 2021-10-21 ENCOUNTER — Ambulatory Visit: Payer: Medicare Other | Admitting: Pharmacist

## 2021-10-21 DIAGNOSIS — I10 Essential (primary) hypertension: Secondary | ICD-10-CM

## 2021-10-21 NOTE — Patient Instructions (Signed)
Check your blood pressure once daily, and any time you have concerning symptoms like headache, chest pain, dizziness, shortness of breath, or vision changes.   Our goal is less than 140/90.  To appropriately check your blood pressure, make sure you do the following:  1) Avoid caffeine, exercise, or tobacco products for 30 minutes before checking. Empty your bladder. 2) Sit with your back supported in a flat-backed chair. Rest your arm on something flat (arm of the chair, table, etc). 3) Sit still with your feet flat on the floor, resting, for at least 5 minutes.  4) Check your blood pressure. Take 1-2 readings.  5) Write down these readings and bring with you to any provider appointments.  Bring your home blood pressure machine with you to a provider's office for accuracy comparison at least once a year.   Make sure you take your blood pressure medications before you come to any office visit, even if you were asked to fast for labs.  Wallace Cullens, PharmD, [place], CPP Clinical Pharmacist [facility] [phone]

## 2021-10-21 NOTE — Chronic Care Management (AMB) (Signed)
Chronic Care Management   Outreach Note  10/21/2021 Name: Joanna Morris MRN: [id number] DOB: October 16, 1950  I connected with Joanna Morris on 10/21/21 by telephone outreach and verified that I am speaking with the correct person using two identifiers.  Patient appearing on report for [place] Metric Hypertension Control due to last documented ambulatory blood pressure of 144/73 on 10/03/2021. No next appointment with PCP currently scheduled  Note patient seen for Office Visit with PCP on 7/31 related to back pain. Provider advised patient: -Start Prednisone taper 7 days, then after finish Start anti-inflammatory trial with rx Naprosyn 500mg  BID wc x 1-2 weeks, then PRN -Gabapentin titration, use QHS prefer adjust dose as tolerated -Continue to use Tylenol PRN for breakthrough -Encouraged use of heating pad 1-2x daily for now then PRN -Follow-up 4-6 weeks if not improved for re-evaluation, consider PT vs Ortho  Outreached patient to discuss hypertension control and medication management.   Outpatient Encounter Medications as of 10/21/2021  Medication Sig Note   acetaminophen (TYLENOL) 325 MG tablet Take 650 mg by mouth every 6 (six) hours as needed.    amLODipine (NORVASC) 5 MG tablet Take 1 tablet (5 mg total) by mouth daily.    gabapentin (NEURONTIN) 100 MG capsule Start 1 capsule daily, increase by 1 cap every 2-3 days as tolerated up to 3 times a day, or may take 3 at once in evening.    hydrochlorothiazide (MICROZIDE) 12.5 MG capsule Take 1 capsule (12.5 mg total) by mouth daily.    naproxen (NAPROSYN) 500 MG tablet Take 1 tablet (500 mg total) by mouth 2 (two) times daily with a meal. For 1-2 weeks then as needed    aspirin EC 81 MG tablet Take 1 tablet (81 mg total) by mouth daily.    Calcium Carbonate-Vitamin D (CALCIUM 600/VITAMIN D PO) Take 1 tablet by mouth daily. 03/18/2019: Calcium only   cholecalciferol (VITAMIN D3) 25 MCG (1000 UNIT) tablet Take 1,000 Units by mouth daily.     diclofenac Sodium (VOLTAREN ARTHRITIS PAIN) 1 % GEL Apply topically 4 (four) times daily.    rosuvastatin (CRESTOR) 5 MG tablet TAKE 1 TABLET(5 MG) BY MOUTH AT BEDTIME    [DISCONTINUED] predniSONE (DELTASONE) 20 MG tablet Take daily with food. Start with 60mg  (3 pills) x 2 days, then reduce to 40mg  (2 pills) x 2 days, then 20mg  (1 pill) x 3 days    No facility-administered encounter medications on file as of 10/21/2021.    Lab Results  Component Value Date   CREATININE 0.80 09/21/2021   BUN 17 09/21/2021   NA 138 09/21/2021   K 3.7 09/21/2021   CL 105 09/21/2021   CO2 26 09/21/2021    BP Readings from Last 3 Encounters:  10/10/21 (!) 143/85  10/03/21 (!) 144/73  09/21/21 (!) 162/92    Pulse Readings from Last 3 Encounters:  10/10/21 76  10/03/21 89  09/21/21 68    Current medications:  HCTZ 12.5 mg daily Amlodipine 5 mg daily Reports taking these medications consistently   Today patient reports back pain significantly improved with following instructions from PCP. Reports completed course of prednisone, now taking naproxen 500 mg daily as needed, acetaminophen as needed and gabapentin only at bedtime when needed  Home Monitoring: Patient reports has obtained her own automated upper arm home BP machine  Denies having checked home blood pressure recently Plans to restart checking home blood pressure after upcoming appointment with radiology  Patient denies adding salt to her food.  Tobacco Use: Reports smokes ~1/2 pack/day Reports plans to quit smoking as she is about to start radiation therapy and was advised to stop by Oncology team   Assessment/Plan: - Currently uncontrolled - Reviewed to check blood pressure, document, and provide at next provider visit - Discussed dietary modifications, such as reduced salt intake, focus on whole grains, vegetables, lean proteins   Follow Up Plan: CM Pharmacist will outreach to patient by telephone again on 11/18/2021 at  8:30 AM  Wallace Cullens, PharmD, [place] [phone]

## 2021-10-25 ENCOUNTER — Encounter
Admission: RE | Admit: 2021-10-25 | Discharge: 2021-10-25 | Disposition: A | Discharge status: Home / Self Care | Payer: Medicare Other | Source: Ambulatory Visit | Attending: Radiation Oncology | Admitting: Radiation Oncology

## 2021-10-25 DIAGNOSIS — C3491 Malignant neoplasm of unspecified part of right bronchus or lung: Secondary | ICD-10-CM | POA: Insufficient documentation

## 2021-10-25 DIAGNOSIS — C3490 Malignant neoplasm of unspecified part of unspecified bronchus or lung: Secondary | ICD-10-CM | POA: Diagnosis not present

## 2021-10-25 LAB — GLUCOSE, CAPILLARY: Glucose-Capillary: 103 mg/dL — ABNORMAL HIGH (ref 70–99)

## 2021-10-25 MED ORDER — FLUDEOXYGLUCOSE F - 18 (FDG) INJECTION
8.9000 | Freq: Once | INTRAVENOUS | Status: AC | PRN
  Administered 2021-10-25: 9.3 via INTRAVENOUS

## 2021-11-01 ENCOUNTER — Ambulatory Visit
Admission: RE | Admit: 2021-11-01 | Discharge: 2021-11-01 | Disposition: A | Discharge status: Home / Self Care | Payer: Medicare Other | Source: Ambulatory Visit | Attending: Radiation Oncology | Admitting: Radiation Oncology

## 2021-11-01 VITALS — BP 128/83 | HR 65 | Temp 96.4°F | Resp 16 | Ht 61.0 in | Wt 178.0 lb

## 2021-11-01 DIAGNOSIS — Z87442 Personal history of urinary calculi: Secondary | ICD-10-CM | POA: Diagnosis not present

## 2021-11-01 DIAGNOSIS — E785 Hyperlipidemia, unspecified: Secondary | ICD-10-CM | POA: Diagnosis not present

## 2021-11-01 DIAGNOSIS — I10 Essential (primary) hypertension: Secondary | ICD-10-CM | POA: Diagnosis not present

## 2021-11-01 DIAGNOSIS — C3412 Malignant neoplasm of upper lobe, left bronchus or lung: Secondary | ICD-10-CM | POA: Diagnosis not present

## 2021-11-01 DIAGNOSIS — C3491 Malignant neoplasm of unspecified part of right bronchus or lung: Secondary | ICD-10-CM

## 2021-11-01 DIAGNOSIS — F17210 Nicotine dependence, cigarettes, uncomplicated: Secondary | ICD-10-CM | POA: Diagnosis not present

## 2021-11-01 DIAGNOSIS — K219 Gastro-esophageal reflux disease without esophagitis: Secondary | ICD-10-CM | POA: Diagnosis not present

## 2021-11-01 DIAGNOSIS — R911 Solitary pulmonary nodule: Secondary | ICD-10-CM | POA: Diagnosis not present

## 2021-11-01 NOTE — Progress Notes (Signed)
Radiation Oncology Follow up Note  Name: Joanna Morris   Date:   11/01/2021 MRN:  [id number] DOB: September 11, 1950    This 71 y.o. female presents to the clinic today for results of her PET CT scan and patient with a probable stage I non-small cell lung cancer left upper lobe and patient status post right upper lobectomy in 2020.  REFERRING PROVIDER: Nobie Putnam *  HPI: Patient is a 71 year old female status post right upper lobectomy 2020 for 2.5 cm adenocarcinoma.  She was seen for a new lesion developing in her left upper lobe.  We ordered a PET CT scan.  Showing a solid 9 mm pulmonary nodule left upper lobe showing mild FDG uptake.  The nodule had increased in size compared to prior CT scans from 21 and is suspicious for indolent primary lung neoplasm.  No evidence of thoracic adenopathy or distant metastatic disease is noted.  She continues to be asymptomatic.  COMPLICATIONS OF TREATMENT: none  FOLLOW UP COMPLIANCE: keeps appointments   PHYSICAL EXAM:  BP 128/83   Pulse 65   Temp (!) 96.4 F (35.8 C)   Resp 16   Ht 5\' 1"  (1.549 m)   Wt 178 lb (80.7 kg)   BMI 33.63 kg/m  Well-developed well-nourished patient in NAD. HEENT reveals PERLA, EOMI, discs not visualized.  Oral cavity is clear. No oral mucosal lesions are identified. Neck is clear without evidence of cervical or supraclavicular adenopathy. Lungs are clear to A&P. Cardiac examination is essentially unremarkable with regular rate and rhythm without murmur rub or thrill. Abdomen is benign with no organomegaly or masses noted. Motor sensory and DTR levels are equal and symmetric in the upper and lower extremities. Cranial nerves II through XII are grossly intact. Proprioception is intact. No peripheral adenopathy or edema is identified. No motor or sensory levels are noted. Crude visual fields are within normal range.  RADIOLOGY RESULTS: PET CT scan reviewed compatible with above-stated findings  PLAN: At this time I am  referring the patient to Dr. Patsey Berthold for possible navigational bronchoscopy.  Even though this is close to the heart we will get Dr. Patsey Berthold opinion about possible biopsy.  Depending on her findings would offer SBRT to this lesion if she cannot biopsy would offer SBRT empirically.  All of this information was discussed and explained to the patient who comprehends my plan well.  We will see her back in follow-up after Dr. Domingo Dimes evaluation.  I would like to take this opportunity to thank you for allowing me to participate in the care of your patient.Noreene Filbert, MD

## 2021-11-18 ENCOUNTER — Telehealth: Payer: Medicare Other

## 2021-11-18 ENCOUNTER — Telehealth: Payer: Self-pay | Admitting: Pharmacist

## 2021-11-18 NOTE — Telephone Encounter (Signed)
  Chronic Care Management   Outreach Note  11/18/2021 Name: ELLICE BOULTINGHOUSE MRN: [id number] DOB: Jul 07, 1950  Referred by: Olin Hauser, DO Reason for referral : No chief complaint on file.   Was unable to reach patient via telephone today and unable to leave a message as voicemail is not setup.   Follow Up Plan: CM Pharmacist will attempt to reach patient by telephone again within the next 30 days  Wallace Cullens, PharmD, [place] Management [phone]

## 2021-11-24 ENCOUNTER — Ambulatory Visit: Payer: Medicare Other | Admitting: Radiation Oncology

## 2021-12-09 ENCOUNTER — Telehealth: Payer: Medicare Other

## 2021-12-12 ENCOUNTER — Ambulatory Visit: Payer: Medicare Other | Admitting: Pharmacist

## 2021-12-12 DIAGNOSIS — I10 Essential (primary) hypertension: Secondary | ICD-10-CM

## 2021-12-12 NOTE — Patient Instructions (Signed)
Check your blood pressure once - twice weekly, and any time you have concerning symptoms like headache, chest pain, dizziness, shortness of breath, or vision changes.   Our goal is less than 130/80.  To appropriately check your blood pressure, make sure you do the following:  1) Avoid caffeine, exercise, or tobacco products for 30 minutes before checking. Empty your bladder. 2) Sit with your back supported in a flat-backed chair. Rest your arm on something flat (arm of the chair, table, etc). 3) Sit still with your feet flat on the floor, resting, for at least 5 minutes.  4) Check your blood pressure. Take 1-2 readings.  5) Write down these readings and bring with you to any provider appointments.  Bring your home blood pressure machine with you to a provider's office for accuracy comparison at least once a year.   Make sure you take your blood pressure medications before you come to any office visit, even if you were asked to fast for labs.  Wallace Cullens, PharmD, [place], CPP Clinical Pharmacist [facility] [phone]

## 2021-12-12 NOTE — Chronic Care Management (AMB) (Signed)
  Chronic Care Management   Outreach Note  12/12/2021 Name: Joanna Morris MRN: [id number] DOB: [birth year]  I connected with Joanna Morris on 12/12/21 by telephone outreach and verified that I am speaking with the correct person using two identifiers.  Patient appearing on report for [place] Metric Hypertension Control due to previously documented ambulatory blood pressure of 144/73 on 10/03/2021. No next appointment with PCP currently scheduled  From review of chart, note at Office Visit with Radiation Oncology on 8/29, BP 128/83, HR 65  Outreached patient to discuss hypertension control and medication management.   Outpatient Encounter Medications as of 12/12/2021  Medication Sig Note   acetaminophen (TYLENOL) 325 MG tablet Take 650 mg by mouth every 6 (six) hours as needed.    amLODipine (NORVASC) 5 MG tablet Take 1 tablet (5 mg total) by mouth daily.    aspirin EC 81 MG tablet Take 1 tablet (81 mg total) by mouth daily.    Calcium Carbonate-Vitamin D (CALCIUM 600/VITAMIN D PO) Take 1 tablet by mouth daily. 03/18/2019: Calcium only   cholecalciferol (VITAMIN D3) 25 MCG (1000 UNIT) tablet Take 1,000 Units by mouth daily.    diclofenac Sodium (VOLTAREN ARTHRITIS PAIN) 1 % GEL Apply topically 4 (four) times daily.    gabapentin (NEURONTIN) 100 MG capsule Start 1 capsule daily, increase by 1 cap every 2-3 days as tolerated up to 3 times a day, or may take 3 at once in evening.    hydrochlorothiazide (MICROZIDE) 12.5 MG capsule Take 1 capsule (12.5 mg total) by mouth daily.    naproxen (NAPROSYN) 500 MG tablet Take 1 tablet (500 mg total) by mouth 2 (two) times daily with a meal. For 1-2 weeks then as needed    rosuvastatin (CRESTOR) 5 MG tablet TAKE 1 TABLET(5 MG) BY MOUTH AT BEDTIME    No facility-administered encounter medications on file as of 12/12/2021.    Lab Results  Component Value Date   CREATININE 0.80 09/21/2021   BUN 17 09/21/2021   NA 138 09/21/2021   K 3.7 09/21/2021    CL 105 09/21/2021   CO2 26 09/21/2021    BP Readings from Last 3 Encounters:  11/01/21 128/83  10/10/21 (!) 143/85  10/03/21 (!) 144/73    Pulse Readings from Last 3 Encounters:  11/01/21 65  10/10/21 76  10/03/21 89    Current medications:  HCTZ 12.5 mg daily Amlodipine 5 mg daily Reports taking these medications consistently and plans to pick up refills of both from pharmacy today     Home Monitoring: Patient reports has obtained her own automated upper arm home BP machine  Denies having checked home blood pressure recently Have encouraged patient to restart checking home blood pressure once-twice/week   Denies adding salt to her food.    Tobacco Use: Reports smokes ~1/2 pack/day Reports plans to quit smoking as she anticipates starting radiation therapy soon and was advised to stop by Oncology team   Assessment/Plan: - Reviewed to check blood pressure, document, and provide at next provider visit - Have discussed dietary modifications, such as reduced salt intake, focus on whole grains, vegetables, lean proteins   Follow Up Plan: CM Pharmacist will outreach to patient by telephone again within the next 90 days  Wallace Cullens, PharmD, [place] [phone]

## 2021-12-13 ENCOUNTER — Ambulatory Visit (INDEPENDENT_AMBULATORY_CARE_PROVIDER_SITE_OTHER): Payer: Medicare Other | Admitting: Pulmonary Disease

## 2021-12-13 ENCOUNTER — Encounter: Payer: Self-pay | Admitting: Pulmonary Disease

## 2021-12-13 VITALS — BP 138/84 | HR 73 | Temp 98.1°F | Ht 61.0 in | Wt 182.6 lb

## 2021-12-13 DIAGNOSIS — R911 Solitary pulmonary nodule: Secondary | ICD-10-CM | POA: Diagnosis not present

## 2021-12-13 DIAGNOSIS — Z85118 Personal history of other malignant neoplasm of bronchus and lung: Secondary | ICD-10-CM | POA: Diagnosis not present

## 2021-12-13 DIAGNOSIS — F17210 Nicotine dependence, cigarettes, uncomplicated: Secondary | ICD-10-CM | POA: Diagnosis not present

## 2021-12-13 NOTE — Patient Instructions (Signed)
We reviewed the films today and I showed you the area of concern in your lung.  Have elected to go straight to the radiation therapy.  Should you change your mind with regards to biopsy or if you have any issues with your breathing please feel free to call us.

## 2021-12-13 NOTE — Progress Notes (Signed)
Subjective:    Patient ID: Joanna Morris, female    DOB: 12/16/1950, 71 y.o.   MRN: [id number] Patient Care Team: Olin Hauser, DO as PCP - General (Family Medicine) Marry Guan, Laurice Record, MD as Consulting Physician (Orthopedic Surgery) Telford Nab, RN as Registered Nurse  Chief Complaint  Patient presents with   Consult    PET scan results. No SOB, wheezing or cough.     HPI The patient is a 71 year old current smoker (half PPD, 67 PY) with a history as noted below, who presents for evaluation of a lung nodule on the left upper lobe with mild FDG uptake.  Is kindly referred by Dr. Berton Mount, her primary care physician is Dr. Nobie Putnam.  Patient underwent right upper lobectomy with mediastinal lymphadenectomy on 02 April 2018 by Dr. Nestor Lewandowsky for resection of an adenocarcinoma that was 2.5 cm in diameter.  The patient is evaluated by Dr. Berton Mount for a new found 9 mm pulmonary nodule in the left upper lobe.  PET/CT shows mild FDG uptake and this is concern for an indolent primary lung neoplasm.  The lesion has been growing when compared to prior scans.  She has been referred for evaluation of potential robotic bronchoscopy for diagnosis of the nodule.  I reviewed all of the films available with the patient.  Patient has been asymptomatic with regards to this finding.  She has had no fevers, chills or sweats.  No cough, no sputum production no hemoptysis.  She does not endorse any shortness of breath.  Continues to smoke half a pack of cigarettes per day and has no intention of quitting.  We discussed the findings on the chest CT as well as on the PET/CT.  Discussed that the lung nodule is easily accessible with robotic bronchoscopy.  The patient had the procedure described to her in detail and was allowed to ask questions which were answered to her satisfaction.  She has opted for empiric SBRT and wishes to forego any further invasive procedures.   Review  of Systems A 10 point review of systems was performed and it is as noted above otherwise negative.  Past Medical History:  Diagnosis Date   Allergy    Cataract    Genital warts    GERD (gastroesophageal reflux disease)    H/O   History of kidney stones    Hyperlipidemia    Hypertension    Lung nodule    Primary cancer of right lung (HCC)    Tobacco dependency    Past Surgical History:  Procedure Laterality Date   ABDOMINAL HYSTERECTOMY     CESAREAN SECTION     X2   KNEE ARTHROSCOPY WITH MEDIAL MENISECTOMY Right 05/22/2016   Procedure: KNEE ARTHROSCOPY WITH PARTIAL MEDIAL MENISECTOMY;  Surgeon: Dereck Leep, MD;  Location: [facility] ORS;  Service: Orthopedics;  Laterality: Right;   OOPHORECTOMY     THORACOTOMY Right 04/02/2018   Procedure: THORACOTOMY AND LUNG RESECTION WITH PREOP BRONCH;  Surgeon: Nestor Lewandowsky, MD;  Location: [facility] ORS;  Service: General;  Laterality: Right;   Patient Active Problem List   Diagnosis Date Noted   Thrombocytopenia ([place]) 12/21/2020   Chronic hepatitis C without hepatic coma ([place]) 06/23/2019   Elevated LFTs 01/17/2019   Goals of care, counseling/discussion 05/07/2018   Primary adenocarcinoma of right lung ([place]) 05/07/2018   Lung mass 04/02/2018   Lung nodule    Osteopenia 01/08/2017   Allergic rhinitis 12/27/2016   Elevated hemoglobin A1c 12/06/2016  Elevated LDL cholesterol level 08/21/2016   Obesity (BMI 30.0-34.9) 12/10/2015   Essential hypertension 12/10/2015   Right medial knee pain 12/10/2015   Environmental and seasonal allergies 12/10/2015   Personal history of tobacco use, presenting hazards to health 12/10/2015   Family History  Problem Relation Age of Onset   Heart disease Mother 59       MI   Heart attack Mother 43   Diabetes Mother    Pancreatic cancer Sister    Ovarian cancer Sister    Breast cancer Neg Hx    Social History   Tobacco Use   Smoking status: Every Day    Packs/day: 1.00    Years: 51.00    Total pack  years: 51.00    Types: Cigarettes   Smokeless tobacco: Never   Tobacco comments:    Quit once for 2 years. pt using nicorette gum     0.5 PPD 12/13/2021  Substance Use Topics   Alcohol use: Not Currently    Alcohol/week: 1.0 - 3.0 standard drink of alcohol    Types: 1 - 3 Standard drinks or equivalent per week    Comment: half a pint of liquor every week   Allergies  Allergen Reactions   Tramadol Rash   Current Meds  Medication Sig   acetaminophen (TYLENOL) 325 MG tablet Take 650 mg by mouth every 6 (six) hours as needed.   amLODipine (NORVASC) 5 MG tablet Take 1 tablet (5 mg total) by mouth daily.   aspirin EC 81 MG tablet Take 1 tablet (81 mg total) by mouth daily.   Calcium Carbonate-Vitamin D (CALCIUM 600/VITAMIN D PO) Take 1 tablet by mouth daily.   cholecalciferol (VITAMIN D3) 25 MCG (1000 UNIT) tablet Take 1,000 Units by mouth daily.   diclofenac Sodium (VOLTAREN ARTHRITIS PAIN) 1 % GEL Apply topically 4 (four) times daily.   gabapentin (NEURONTIN) 100 MG capsule Start 1 capsule daily, increase by 1 cap every 2-3 days as tolerated up to 3 times a day, or may take 3 at once in evening.   hydrochlorothiazide (MICROZIDE) 12.5 MG capsule Take 1 capsule (12.5 mg total) by mouth daily.   naproxen (NAPROSYN) 500 MG tablet Take 1 tablet (500 mg total) by mouth 2 (two) times daily with a meal. For 1-2 weeks then as needed   rosuvastatin (CRESTOR) 5 MG tablet TAKE 1 TABLET(5 MG) BY MOUTH AT BEDTIME   Immunization History  Administered Date(s) Administered   Fluad Quad(high Dose 65+) 01/17/2019, 02/10/2020, 12/21/2020   Influenza, High Dose Seasonal PF 12/27/2016, 01/16/2018   PFIZER(Purple Top)SARS-COV-2 Vaccination 05/01/2019, 05/27/2019, 01/03/2020       Objective:   Physical Exam BP 138/84 (BP Location: Left Arm, Cuff Size: Normal)   Pulse 73   Temp 98.1 F (36.7 C)   Ht 5\' 1"  (1.549 m)   Wt 182 lb 9.6 oz (82.8 kg)   SpO2 98%   BMI 34.50 kg/m  GENERAL: Obese woman, no  acute distress, fully ambulatory, no conversational dyspnea. HEAD: Normocephalic, atraumatic.  EYES: Pupils equal, round, reactive to light.  No scleral icterus.  MOUTH: Poor dentition, oral mucosa moist.  No thrush. NECK: Supple. No thyromegaly. Trachea midline. No JVD.  No adenopathy. PULMONARY: Good air entry bilaterally.  Coarse, otherwise, no adventitious sounds. CARDIOVASCULAR: S1 and S2. Regular rate and rhythm.  ABDOMEN: Obese, otherwise benign. MUSCULOSKELETAL: No joint deformity, no clubbing, no edema.  NEUROLOGIC: No overt focal deficit, no gait disturbance, speech is fluent. SKIN: Intact,warm,dry. PSYCH: Mood and  behavior normal.        Assessment & Plan:     ICD-10-CM   1. Lung nodule -  9 mm LUL  R91.1    Left upper lobe nodule Steady growth since 08/2019 Mild FDG activity Suspect indolent carcinoma    2. History of adenocarcinoma of lung  Z85.118    Status post right upper lobectomy Adenocarcinoma of the lung previously    3. Tobacco dependence due to cigarettes  F17.210    Patient counseled regards to discontinuation of smoking  Total counseling time 3 to 5 minutes Patient not interested in quitting     I discussed the findings of the chest CT and PET/CT with the patient.  Also discussed the concern with regards to steady growth of the lesion noted since 2021.  This could indicate an indolent growing tumor.  Described that the lesion would need to be approached with robotic bronchoscopy and that she would require general anesthesia for this procedure.  We described the potential benefits, limitations and complications of the procedure.  The patient stated that she would rather go to empiric SBRT without a definitive diagnosis.  She would not state her concerns with regards to the procedure.  I will have her see Dr. Baruch Gouty again with regards to empiric SBRT to this area.  Should she change her mind she is welcome to return for scheduling of robotic procedure if she  desires.  She is to return as needed.  Renold Don, MD Advanced Bronchoscopy PCCM [facility]    *This note was dictated using voice recognition software/Dragon.  Despite best efforts to proofread, errors can occur which can change the meaning. Any transcriptional errors that result from this process are unintentional and may not be fully corrected at the time of dictation.

## 2021-12-15 ENCOUNTER — Other Ambulatory Visit: Payer: Self-pay | Admitting: Family Medicine

## 2021-12-15 DIAGNOSIS — Z1231 Encounter for screening mammogram for malignant neoplasm of breast: Secondary | ICD-10-CM

## 2021-12-19 ENCOUNTER — Ambulatory Visit
Admission: RE | Admit: 2021-12-19 | Discharge: 2021-12-19 | Disposition: A | Discharge status: Home / Self Care | Payer: Medicare Other | Source: Ambulatory Visit | Attending: Radiation Oncology | Admitting: Radiation Oncology

## 2021-12-19 VITALS — BP 184/86 | HR 63 | Temp 97.0°F | Resp 18 | Ht 62.0 in | Wt 181.4 lb

## 2021-12-19 DIAGNOSIS — C3412 Malignant neoplasm of upper lobe, left bronchus or lung: Secondary | ICD-10-CM | POA: Diagnosis not present

## 2021-12-19 DIAGNOSIS — C3491 Malignant neoplasm of unspecified part of right bronchus or lung: Secondary | ICD-10-CM | POA: Diagnosis not present

## 2021-12-19 DIAGNOSIS — F17210 Nicotine dependence, cigarettes, uncomplicated: Secondary | ICD-10-CM | POA: Diagnosis not present

## 2021-12-19 NOTE — Progress Notes (Signed)
Radiation Oncology Follow up Note  Name: Joanna Morris   Date:   12/19/2021 MRN:  [id number] DOB: [birth year]    This 71 y.o. female presents to the clinic today for follow-up of a probable stage I non-small cell lung cancer left upper lobe and patient status post right upper lobectomy in 2020.  REFERRING PROVIDER: Nobie Putnam *  HPI: Patient is a 71 year old female followed for a hypermetabolic lesion of the left upper lobe consistent with a probable stage I non-small cell lung cancer with been following and is progressing in size.  She is status post right upper lobectomy in 2020 for 2.5 cm adenocarcinoma.  I referred her to Dr. Patsey Berthold who declined bronchoscopy patient on the patient's poor overall lung function and patient did not want to risk potential side effects of bronchoscopy and biopsy.  She seen today can consider empiric radiation therapy using SBRT to this lesion.  She continues to be asymptomatic specifically [place] cough hemoptysis or chest tightness..  COMPLICATIONS OF TREATMENT: none  FOLLOW UP COMPLIANCE: keeps appointments   PHYSICAL EXAM:  BP (!) 184/86   Pulse 63   Temp (!) 97 F (36.1 C)   Resp 18   Ht 5\' 2"  (1.575 m)   Wt 181 lb 6.4 oz (82.3 kg)   BMI 33.18 kg/m  Well-developed well-nourished patient in NAD. HEENT reveals PERLA, EOMI, discs not visualized.  Oral cavity is clear. No oral mucosal lesions are identified. Neck is clear without evidence of cervical or supraclavicular adenopathy. Lungs are clear to A&P. Cardiac examination is essentially unremarkable with regular rate and rhythm without murmur rub or thrill. Abdomen is benign with no organomegaly or masses noted. Motor sensory and DTR levels are equal and symmetric in the upper and lower extremities. Cranial nerves II through XII are grossly intact. Proprioception is intact. No peripheral adenopathy or edema is identified. No motor or sensory levels are noted. Crude visual fields are within  normal range.  RADIOLOGY RESULTS: PET CT and CT scans reviewed compatible with above-stated findings  PLAN: At this time we will offer SBRT to her left upper lobe lesion.  Is in close proximity to the heart we may have to alter our total dose constraints.  I would plan on delivering 60 Gray in 5 fractions using SBRT.  We also use for dimensional treatment planning as well as motion restriction.  Risks and benefits of treatment including low side effect profile which may include slight cough after completion of treatment were reviewed with the patient.  She has comprehends my treatment plan well.  I personally set up and ordered CT simulation for early next week.  I would like to take this opportunity to thank you for allowing me to participate in the care of your patient.Noreene Filbert, MD

## 2021-12-21 DIAGNOSIS — R911 Solitary pulmonary nodule: Secondary | ICD-10-CM | POA: Diagnosis not present

## 2021-12-21 DIAGNOSIS — F17210 Nicotine dependence, cigarettes, uncomplicated: Secondary | ICD-10-CM | POA: Diagnosis not present

## 2021-12-26 ENCOUNTER — Ambulatory Visit
Admission: RE | Admit: 2021-12-26 | Discharge: 2021-12-26 | Disposition: A | Discharge status: Home / Self Care | Payer: Medicare Other | Source: Ambulatory Visit | Attending: Radiation Oncology | Admitting: Radiation Oncology

## 2021-12-26 DIAGNOSIS — R911 Solitary pulmonary nodule: Secondary | ICD-10-CM | POA: Diagnosis not present

## 2021-12-26 DIAGNOSIS — F17210 Nicotine dependence, cigarettes, uncomplicated: Secondary | ICD-10-CM | POA: Diagnosis not present

## 2021-12-26 DIAGNOSIS — C3491 Malignant neoplasm of unspecified part of right bronchus or lung: Secondary | ICD-10-CM | POA: Diagnosis not present

## 2021-12-29 NOTE — Progress Notes (Signed)
Patient's treatment prescription was changed to 60 Gray in 10 fractions from [place] in 5 fractions secondary to dose constraints that could not be met secondary to to close proximity to the heart.

## 2021-12-29 NOTE — Addendum Note (Signed)
Encounter addended by: Noreene Filbert, MD on: 12/29/2021 2:02 PM  Actions taken: Clinical Note Signed

## 2022-01-03 ENCOUNTER — Ambulatory Visit: Payer: Medicare Other

## 2022-01-04 ENCOUNTER — Ambulatory Visit: Payer: Medicare Other

## 2022-01-05 ENCOUNTER — Ambulatory Visit: Payer: Medicare Other

## 2022-01-06 ENCOUNTER — Ambulatory Visit: Payer: Medicare Other

## 2022-01-06 ENCOUNTER — Other Ambulatory Visit: Payer: Self-pay | Admitting: *Deleted

## 2022-01-09 ENCOUNTER — Ambulatory Visit: Payer: Medicare Other

## 2022-01-10 ENCOUNTER — Ambulatory Visit: Payer: Medicare Other

## 2022-01-10 ENCOUNTER — Telehealth: Payer: Self-pay | Admitting: Pulmonary Disease

## 2022-01-10 NOTE — Telephone Encounter (Signed)
Rodena Piety, can you help with this?

## 2022-01-10 NOTE — Telephone Encounter (Signed)
Margie we haven't order any new CTs

## 2022-01-10 NOTE — Telephone Encounter (Signed)
Spoke to Joanna Morris with [facility] in reference to this message. He stated that this message could be disregarded, as this is typically addressed via mail. Reference number [id number]. Nothing further needed.

## 2022-01-11 ENCOUNTER — Ambulatory Visit: Payer: Medicare Other

## 2022-01-12 ENCOUNTER — Ambulatory Visit: Payer: Medicare Other

## 2022-01-12 DIAGNOSIS — R911 Solitary pulmonary nodule: Secondary | ICD-10-CM | POA: Diagnosis not present

## 2022-01-12 DIAGNOSIS — F17210 Nicotine dependence, cigarettes, uncomplicated: Secondary | ICD-10-CM | POA: Diagnosis not present

## 2022-01-12 DIAGNOSIS — C3491 Malignant neoplasm of unspecified part of right bronchus or lung: Secondary | ICD-10-CM | POA: Diagnosis not present

## 2022-01-13 ENCOUNTER — Ambulatory Visit: Payer: Medicare Other

## 2022-01-16 ENCOUNTER — Ambulatory Visit: Payer: Medicare Other

## 2022-01-16 ENCOUNTER — Other Ambulatory Visit: Payer: Self-pay

## 2022-01-16 ENCOUNTER — Ambulatory Visit
Admission: RE | Admit: 2022-01-16 | Discharge: 2022-01-16 | Disposition: A | Discharge status: Home / Self Care | Payer: Medicare Other | Source: Ambulatory Visit | Attending: Radiation Oncology | Admitting: Radiation Oncology

## 2022-01-16 DIAGNOSIS — C3491 Malignant neoplasm of unspecified part of right bronchus or lung: Secondary | ICD-10-CM | POA: Diagnosis not present

## 2022-01-16 DIAGNOSIS — F17210 Nicotine dependence, cigarettes, uncomplicated: Secondary | ICD-10-CM | POA: Diagnosis not present

## 2022-01-16 DIAGNOSIS — Z510 Encounter for antineoplastic radiation therapy: Secondary | ICD-10-CM | POA: Diagnosis not present

## 2022-01-16 DIAGNOSIS — R911 Solitary pulmonary nodule: Secondary | ICD-10-CM | POA: Diagnosis not present

## 2022-01-16 LAB — RAD ONC ARIA SESSION SUMMARY
Course Elapsed Days: 0
Plan Fractions Treated to Date: 1
Plan Prescribed Dose Per Fraction: 5 Gy
Plan Total Fractions Prescribed: 10
Plan Total Prescribed Dose: 50 Gy
Reference Point Dosage Given to Date: 5 Gy
Reference Point Session Dosage Given: 5 Gy
Session Number: 1

## 2022-01-17 ENCOUNTER — Ambulatory Visit
Admission: RE | Admit: 2022-01-17 | Discharge: 2022-01-17 | Disposition: A | Discharge status: Home / Self Care | Payer: Medicare Other | Source: Ambulatory Visit | Attending: Radiation Oncology | Admitting: Radiation Oncology

## 2022-01-17 ENCOUNTER — Ambulatory Visit: Payer: Medicare Other

## 2022-01-17 ENCOUNTER — Other Ambulatory Visit: Payer: Self-pay

## 2022-01-17 DIAGNOSIS — Z510 Encounter for antineoplastic radiation therapy: Secondary | ICD-10-CM | POA: Diagnosis not present

## 2022-01-17 DIAGNOSIS — M2392 Unspecified internal derangement of left knee: Secondary | ICD-10-CM | POA: Diagnosis not present

## 2022-01-17 DIAGNOSIS — F17210 Nicotine dependence, cigarettes, uncomplicated: Secondary | ICD-10-CM | POA: Diagnosis not present

## 2022-01-17 DIAGNOSIS — C3491 Malignant neoplasm of unspecified part of right bronchus or lung: Secondary | ICD-10-CM | POA: Diagnosis not present

## 2022-01-17 DIAGNOSIS — M25562 Pain in left knee: Secondary | ICD-10-CM | POA: Diagnosis not present

## 2022-01-17 DIAGNOSIS — R911 Solitary pulmonary nodule: Secondary | ICD-10-CM | POA: Diagnosis not present

## 2022-01-17 DIAGNOSIS — M25561 Pain in right knee: Secondary | ICD-10-CM | POA: Diagnosis not present

## 2022-01-17 LAB — RAD ONC ARIA SESSION SUMMARY
Course Elapsed Days: 1
Plan Fractions Treated to Date: 2
Plan Prescribed Dose Per Fraction: 5 Gy
Plan Total Fractions Prescribed: 10
Plan Total Prescribed Dose: 50 Gy
Reference Point Dosage Given to Date: 10 Gy
Reference Point Session Dosage Given: 5 Gy
Session Number: 2

## 2022-01-18 ENCOUNTER — Other Ambulatory Visit: Payer: Self-pay

## 2022-01-18 ENCOUNTER — Ambulatory Visit
Admission: RE | Admit: 2022-01-18 | Discharge: 2022-01-18 | Disposition: A | Discharge status: Home / Self Care | Payer: Medicare Other | Source: Ambulatory Visit | Attending: Radiation Oncology | Admitting: Radiation Oncology

## 2022-01-18 DIAGNOSIS — Z510 Encounter for antineoplastic radiation therapy: Secondary | ICD-10-CM | POA: Diagnosis not present

## 2022-01-18 DIAGNOSIS — F17210 Nicotine dependence, cigarettes, uncomplicated: Secondary | ICD-10-CM | POA: Diagnosis not present

## 2022-01-18 DIAGNOSIS — C3491 Malignant neoplasm of unspecified part of right bronchus or lung: Secondary | ICD-10-CM | POA: Diagnosis not present

## 2022-01-18 DIAGNOSIS — R911 Solitary pulmonary nodule: Secondary | ICD-10-CM | POA: Diagnosis not present

## 2022-01-18 LAB — RAD ONC ARIA SESSION SUMMARY
Course Elapsed Days: 2
Plan Fractions Treated to Date: 3
Plan Prescribed Dose Per Fraction: 5 Gy
Plan Total Fractions Prescribed: 10
Plan Total Prescribed Dose: 50 Gy
Reference Point Dosage Given to Date: 15 Gy
Reference Point Session Dosage Given: 5 Gy
Session Number: 3

## 2022-01-19 ENCOUNTER — Ambulatory Visit: Payer: Medicare Other

## 2022-01-19 ENCOUNTER — Other Ambulatory Visit: Payer: Self-pay

## 2022-01-19 ENCOUNTER — Ambulatory Visit
Admission: RE | Admit: 2022-01-19 | Discharge: 2022-01-19 | Disposition: A | Discharge status: Home / Self Care | Payer: Medicare Other | Source: Ambulatory Visit | Attending: Radiation Oncology | Admitting: Radiation Oncology

## 2022-01-19 DIAGNOSIS — F17210 Nicotine dependence, cigarettes, uncomplicated: Secondary | ICD-10-CM | POA: Diagnosis not present

## 2022-01-19 DIAGNOSIS — R911 Solitary pulmonary nodule: Secondary | ICD-10-CM | POA: Diagnosis not present

## 2022-01-19 DIAGNOSIS — Z510 Encounter for antineoplastic radiation therapy: Secondary | ICD-10-CM | POA: Diagnosis not present

## 2022-01-19 DIAGNOSIS — C3491 Malignant neoplasm of unspecified part of right bronchus or lung: Secondary | ICD-10-CM | POA: Diagnosis not present

## 2022-01-19 LAB — RAD ONC ARIA SESSION SUMMARY
Course Elapsed Days: 3
Plan Fractions Treated to Date: 4
Plan Prescribed Dose Per Fraction: 5 Gy
Plan Total Fractions Prescribed: 10
Plan Total Prescribed Dose: 50 Gy
Reference Point Dosage Given to Date: 20 Gy
Reference Point Session Dosage Given: 5 Gy
Session Number: 4

## 2022-01-20 ENCOUNTER — Other Ambulatory Visit: Payer: Self-pay

## 2022-01-20 ENCOUNTER — Ambulatory Visit
Admission: RE | Admit: 2022-01-20 | Discharge: 2022-01-20 | Disposition: A | Discharge status: Home / Self Care | Payer: Medicare Other | Source: Ambulatory Visit | Attending: Radiation Oncology | Admitting: Radiation Oncology

## 2022-01-20 ENCOUNTER — Ambulatory Visit: Payer: Medicare Other

## 2022-01-20 DIAGNOSIS — F17210 Nicotine dependence, cigarettes, uncomplicated: Secondary | ICD-10-CM | POA: Diagnosis not present

## 2022-01-20 DIAGNOSIS — R911 Solitary pulmonary nodule: Secondary | ICD-10-CM | POA: Diagnosis not present

## 2022-01-20 DIAGNOSIS — C3491 Malignant neoplasm of unspecified part of right bronchus or lung: Secondary | ICD-10-CM | POA: Diagnosis not present

## 2022-01-20 DIAGNOSIS — Z510 Encounter for antineoplastic radiation therapy: Secondary | ICD-10-CM | POA: Diagnosis not present

## 2022-01-20 LAB — RAD ONC ARIA SESSION SUMMARY
Course Elapsed Days: 4
Plan Fractions Treated to Date: 5
Plan Prescribed Dose Per Fraction: 5 Gy
Plan Total Fractions Prescribed: 10
Plan Total Prescribed Dose: 50 Gy
Reference Point Dosage Given to Date: 25 Gy
Reference Point Session Dosage Given: 5 Gy
Session Number: 5

## 2022-01-23 ENCOUNTER — Other Ambulatory Visit: Payer: Self-pay

## 2022-01-23 ENCOUNTER — Ambulatory Visit: Payer: Medicare Other

## 2022-01-23 ENCOUNTER — Ambulatory Visit
Admission: RE | Admit: 2022-01-23 | Discharge: 2022-01-23 | Disposition: A | Discharge status: Home / Self Care | Payer: Medicare Other | Source: Ambulatory Visit | Attending: Radiation Oncology | Admitting: Radiation Oncology

## 2022-01-23 DIAGNOSIS — C3491 Malignant neoplasm of unspecified part of right bronchus or lung: Secondary | ICD-10-CM | POA: Diagnosis not present

## 2022-01-23 DIAGNOSIS — Z510 Encounter for antineoplastic radiation therapy: Secondary | ICD-10-CM | POA: Diagnosis not present

## 2022-01-23 DIAGNOSIS — R911 Solitary pulmonary nodule: Secondary | ICD-10-CM | POA: Diagnosis not present

## 2022-01-23 DIAGNOSIS — F17210 Nicotine dependence, cigarettes, uncomplicated: Secondary | ICD-10-CM | POA: Diagnosis not present

## 2022-01-23 LAB — RAD ONC ARIA SESSION SUMMARY
Course Elapsed Days: 7
Plan Fractions Treated to Date: 6
Plan Prescribed Dose Per Fraction: 5 Gy
Plan Total Fractions Prescribed: 10
Plan Total Prescribed Dose: 50 Gy
Reference Point Dosage Given to Date: 30 Gy
Reference Point Session Dosage Given: 5 Gy
Session Number: 6

## 2022-01-24 ENCOUNTER — Ambulatory Visit
Admission: RE | Admit: 2022-01-24 | Discharge: 2022-01-24 | Disposition: A | Discharge status: Home / Self Care | Payer: Medicare Other | Source: Ambulatory Visit | Attending: Radiation Oncology | Admitting: Radiation Oncology

## 2022-01-24 ENCOUNTER — Other Ambulatory Visit: Payer: Self-pay

## 2022-01-24 ENCOUNTER — Ambulatory Visit: Payer: Medicare Other

## 2022-01-24 DIAGNOSIS — R911 Solitary pulmonary nodule: Secondary | ICD-10-CM | POA: Diagnosis not present

## 2022-01-24 DIAGNOSIS — F17210 Nicotine dependence, cigarettes, uncomplicated: Secondary | ICD-10-CM | POA: Diagnosis not present

## 2022-01-24 DIAGNOSIS — Z510 Encounter for antineoplastic radiation therapy: Secondary | ICD-10-CM | POA: Diagnosis not present

## 2022-01-24 DIAGNOSIS — C3491 Malignant neoplasm of unspecified part of right bronchus or lung: Secondary | ICD-10-CM | POA: Diagnosis not present

## 2022-01-24 LAB — RAD ONC ARIA SESSION SUMMARY
Course Elapsed Days: 8
Plan Fractions Treated to Date: 7
Plan Prescribed Dose Per Fraction: 5 Gy
Plan Total Fractions Prescribed: 10
Plan Total Prescribed Dose: 50 Gy
Reference Point Dosage Given to Date: 35 Gy
Reference Point Session Dosage Given: 5 Gy
Session Number: 7

## 2022-01-25 ENCOUNTER — Ambulatory Visit
Admission: RE | Admit: 2022-01-25 | Discharge: 2022-01-25 | Disposition: A | Discharge status: Home / Self Care | Payer: Medicare Other | Source: Ambulatory Visit | Attending: Radiation Oncology | Admitting: Radiation Oncology

## 2022-01-25 ENCOUNTER — Other Ambulatory Visit: Payer: Self-pay

## 2022-01-25 ENCOUNTER — Ambulatory Visit: Payer: Medicare Other

## 2022-01-25 DIAGNOSIS — F17210 Nicotine dependence, cigarettes, uncomplicated: Secondary | ICD-10-CM | POA: Diagnosis not present

## 2022-01-25 DIAGNOSIS — C3491 Malignant neoplasm of unspecified part of right bronchus or lung: Secondary | ICD-10-CM | POA: Diagnosis not present

## 2022-01-25 DIAGNOSIS — R911 Solitary pulmonary nodule: Secondary | ICD-10-CM | POA: Diagnosis not present

## 2022-01-25 DIAGNOSIS — Z510 Encounter for antineoplastic radiation therapy: Secondary | ICD-10-CM | POA: Diagnosis not present

## 2022-01-25 LAB — RAD ONC ARIA SESSION SUMMARY
Course Elapsed Days: 9
Plan Fractions Treated to Date: 8
Plan Prescribed Dose Per Fraction: 5 Gy
Plan Total Fractions Prescribed: 10
Plan Total Prescribed Dose: 50 Gy
Reference Point Dosage Given to Date: 40 Gy
Reference Point Session Dosage Given: 5 Gy
Session Number: 8

## 2022-01-30 ENCOUNTER — Other Ambulatory Visit: Payer: Self-pay

## 2022-01-30 ENCOUNTER — Ambulatory Visit
Admission: RE | Admit: 2022-01-30 | Discharge: 2022-01-30 | Disposition: A | Discharge status: Home / Self Care | Payer: Medicare Other | Source: Ambulatory Visit | Attending: Radiation Oncology | Admitting: Radiation Oncology

## 2022-01-30 DIAGNOSIS — Z510 Encounter for antineoplastic radiation therapy: Secondary | ICD-10-CM | POA: Diagnosis not present

## 2022-01-30 DIAGNOSIS — R911 Solitary pulmonary nodule: Secondary | ICD-10-CM | POA: Diagnosis not present

## 2022-01-30 DIAGNOSIS — C3491 Malignant neoplasm of unspecified part of right bronchus or lung: Secondary | ICD-10-CM | POA: Diagnosis not present

## 2022-01-30 DIAGNOSIS — F17210 Nicotine dependence, cigarettes, uncomplicated: Secondary | ICD-10-CM | POA: Diagnosis not present

## 2022-01-30 LAB — RAD ONC ARIA SESSION SUMMARY
Course Elapsed Days: 14
Plan Fractions Treated to Date: 9
Plan Prescribed Dose Per Fraction: 5 Gy
Plan Total Fractions Prescribed: 10
Plan Total Prescribed Dose: 50 Gy
Reference Point Dosage Given to Date: 45 Gy
Reference Point Session Dosage Given: 5 Gy
Session Number: 9

## 2022-01-31 ENCOUNTER — Ambulatory Visit
Admission: RE | Admit: 2022-01-31 | Discharge: 2022-01-31 | Disposition: A | Discharge status: Home / Self Care | Payer: Medicare Other | Source: Ambulatory Visit | Attending: Radiation Oncology | Admitting: Radiation Oncology

## 2022-01-31 ENCOUNTER — Ambulatory Visit: Payer: Medicare Other

## 2022-01-31 ENCOUNTER — Other Ambulatory Visit: Payer: Self-pay

## 2022-01-31 DIAGNOSIS — Z510 Encounter for antineoplastic radiation therapy: Secondary | ICD-10-CM | POA: Diagnosis not present

## 2022-01-31 DIAGNOSIS — F17210 Nicotine dependence, cigarettes, uncomplicated: Secondary | ICD-10-CM | POA: Diagnosis not present

## 2022-01-31 DIAGNOSIS — C3491 Malignant neoplasm of unspecified part of right bronchus or lung: Secondary | ICD-10-CM | POA: Diagnosis not present

## 2022-01-31 DIAGNOSIS — R911 Solitary pulmonary nodule: Secondary | ICD-10-CM | POA: Diagnosis not present

## 2022-01-31 LAB — RAD ONC ARIA SESSION SUMMARY
Course Elapsed Days: 15
Plan Fractions Treated to Date: 10
Plan Prescribed Dose Per Fraction: 5 Gy
Plan Total Fractions Prescribed: 10
Plan Total Prescribed Dose: 50 Gy
Reference Point Dosage Given to Date: 50 Gy
Reference Point Session Dosage Given: 5 Gy
Session Number: 10

## 2022-02-02 ENCOUNTER — Ambulatory Visit: Payer: Medicare Other

## 2022-02-07 ENCOUNTER — Ambulatory Visit: Payer: Medicare Other

## 2022-02-15 ENCOUNTER — Ambulatory Visit: Payer: Medicare Other | Admitting: Pharmacist

## 2022-02-15 ENCOUNTER — Other Ambulatory Visit: Payer: Self-pay | Admitting: Family Medicine

## 2022-02-15 DIAGNOSIS — I10 Essential (primary) hypertension: Secondary | ICD-10-CM

## 2022-02-15 MED ORDER — HYDROCHLOROTHIAZIDE 12.5 MG PO CAPS: 12.5000 mg | ORAL_CAPSULE | Freq: Every day | ORAL | 3 refills | Status: DC

## 2022-02-15 NOTE — Chronic Care Management (AMB) (Signed)
Chronic Care Management   Outreach Note  02/15/2022 Name: Joanna Morris MRN: [id number] DOB: [birth year]  I connected with Phylliss Blakes Sullenger on 02/15/22 by telephone outreach and verified that I am speaking with the correct person using two identifiers.  Patient appearing on report for [place] Metric Hypertension Control due to previously documented ambulatory blood pressure of 184/86 on 12/19/2021. No next appointment with PCP currently scheduled    Outreached patient to discuss hypertension control and medication management.   Outpatient Encounter Medications as of 02/15/2022  Medication Sig Note   acetaminophen (TYLENOL) 325 MG tablet Take 650 mg by mouth every 6 (six) hours as needed.    amLODipine (NORVASC) 5 MG tablet Take 1 tablet (5 mg total) by mouth daily.    aspirin EC 81 MG tablet Take 1 tablet (81 mg total) by mouth daily.    Calcium Carbonate-Vitamin D (CALCIUM 600/VITAMIN D PO) Take 1 tablet by mouth daily. 03/18/2019: Calcium only   cholecalciferol (VITAMIN D3) 25 MCG (1000 UNIT) tablet Take 1,000 Units by mouth daily.    diclofenac Sodium (VOLTAREN ARTHRITIS PAIN) 1 % GEL Apply topically 4 (four) times daily.    gabapentin (NEURONTIN) 100 MG capsule Start 1 capsule daily, increase by 1 cap every 2-3 days as tolerated up to 3 times a day, or may take 3 at once in evening.    hydrochlorothiazide (MICROZIDE) 12.5 MG capsule Take 1 capsule (12.5 mg total) by mouth daily.    naproxen (NAPROSYN) 500 MG tablet Take 1 tablet (500 mg total) by mouth 2 (two) times daily with a meal. For 1-2 weeks then as needed    rosuvastatin (CRESTOR) 5 MG tablet TAKE 1 TABLET(5 MG) BY MOUTH AT BEDTIME    No facility-administered encounter medications on file as of 02/15/2022.    Lab Results  Component Value Date   CREATININE 0.80 09/21/2021   BUN 17 09/21/2021   NA 138 09/21/2021   K 3.7 09/21/2021   CL 105 09/21/2021   CO2 26 09/21/2021    BP Readings from Last 3 Encounters:   12/19/21 (!) 184/86  12/13/21 138/84  11/01/21 128/83    Pulse Readings from Last 3 Encounters:  12/19/21 63  12/13/21 73  11/01/21 65   Note patient recently underwent radiation for lung nodule in November. Reports plans to follow up with Dr. Baruch Gouty on 03/08/2022.  Current medications:  HCTZ 12.5 mg daily Amlodipine 5 mg daily  Again, question patient's adherence to medications. Per review of dispensing history in chart, amlodipine 5 mg Rx last filled 06/28/2921 for 90 day supply and HCTZ 12.5 mg Rx last filled 03/17/2021 for 90 day supply   Home Monitoring: Patient reports has an automated upper arm home BP machine  Denies having checked home blood pressure recently Again encourage patient to restart checking home blood pressure twice/week   Denies adding salt to her food.    Tobacco Use: Reports smokes ~1/2 pack/day - Reports has reduced smoking by using nicotine lozenges Reports interested in quitting, but not ready to set a quit date Motivation: lung cancer risk Triggers: work stress Strategies: listening to music   Assessment/Plan: - Reviewed goal blood pressure <130/80 - Counseled on long term microvascular and macrovascular complications of uncontrolled hypertension - Reviewed appropriate home BP monitoring technique (avoid caffeine, smoking, and exercise for 30 minutes before checking, rest for at least 5 minutes before taking BP, sit with feet flat on the floor and back against a hard surface, uncross legs, and  rest arm on flat surface) - Reviewed to check blood pressure twice weekly, document, and provide at next provider visit - Discussed dietary modifications, such as reduced salt intake, focus on whole grains, vegetables, lean proteins - Provided motivational interviewing to assess tobacco use and strategies for reduction - Provided information on 1 800 QUIT NOW support program - Counsel patient on importance of medication adherence. Encourage patient to pick  up refills of blood pressure medications and take consistently Collaborate with PCP to request renewal of HCTZ Rx be sent to pharmacy as current Rx expired Request [facility] refill amlodipine Rx for patient Follow up with patient who reports that she will pick up these prescriptions from her pharmacy  Follow Up Plan: CM Pharmacist will outreach to patient by telephone again within the next 30 days  Wallace Cullens, PharmD, [place] [phone]

## 2022-02-15 NOTE — Patient Instructions (Signed)
Check your blood pressure twice weekly, and any time you have concerning symptoms like headache, chest pain, dizziness, shortness of breath, or vision changes.   Our goal is less than 130/80.  To appropriately check your blood pressure, make sure you do the following:  1) Avoid caffeine, exercise, or tobacco products for 30 minutes before checking. Empty your bladder. 2) Sit with your back supported in a flat-backed chair. Rest your arm on something flat (arm of the chair, table, etc). 3) Sit still with your feet flat on the floor, resting, for at least 5 minutes.  4) Check your blood pressure. Take 1-2 readings.  5) Write down these readings and bring with you to any provider appointments.  Bring your home blood pressure machine with you to a provider's office for accuracy comparison at least once a year.   Make sure you take your blood pressure medications before you come to any office visit, even if you were asked to fast for labs.  Wallace Cullens, PharmD, [place], CPP Clinical Pharmacist [facility] [phone]

## 2022-02-20 DIAGNOSIS — M2392 Unspecified internal derangement of left knee: Secondary | ICD-10-CM | POA: Diagnosis not present

## 2022-02-22 ENCOUNTER — Other Ambulatory Visit: Payer: Self-pay | Admitting: Physician Assistant

## 2022-02-22 DIAGNOSIS — M2392 Unspecified internal derangement of left knee: Secondary | ICD-10-CM

## 2022-03-01 ENCOUNTER — Ambulatory Visit: Payer: Medicare Other | Admitting: Pharmacist

## 2022-03-01 DIAGNOSIS — I10 Essential (primary) hypertension: Secondary | ICD-10-CM

## 2022-03-01 NOTE — Patient Instructions (Signed)
Check your blood pressure twice weekly, and any time you have concerning symptoms like headache, chest pain, dizziness, shortness of breath, or vision changes.   Our goal is less than 130/80.  To appropriately check your blood pressure, make sure you do the following:  1) Avoid caffeine, exercise, or tobacco products for 30 minutes before checking. Empty your bladder. 2) Sit with your back supported in a flat-backed chair. Rest your arm on something flat (arm of the chair, table, etc). 3) Sit still with your feet flat on the floor, resting, for at least 5 minutes.  4) Check your blood pressure. Take 1-2 readings.  5) Write down these readings and bring with you to any provider appointments.  Bring your home blood pressure machine with you to a provider's office for accuracy comparison at least once a year.   Make sure you take your blood pressure medications before you come to any office visit, even if you were asked to fast for labs.  Wallace Cullens, PharmD, [place], CPP Clinical Pharmacist [facility] [phone]

## 2022-03-01 NOTE — Chronic Care Management (AMB) (Signed)
Chronic Care Management   Outreach Note  03/01/2022 Name: Joanna Morris MRN: [id number] DOB: 08/13/1950  I connected with Joanna Morris on 03/01/22 by telephone outreach and verified that I am speaking with the correct person using two identifiers.  Patient appearing on report for [place] Metric Hypertension Control due to previously documented ambulatory blood pressure of 184/86 on 12/19/2021. No next appointment with PCP currently scheduled   Outreached patient to discuss hypertension control and medication management.   Outpatient Encounter Medications as of 03/01/2022  Medication Sig Note   amLODipine (NORVASC) 5 MG tablet Take 1 tablet (5 mg total) by mouth daily.    hydrochlorothiazide (MICROZIDE) 12.5 MG capsule Take 1 capsule (12.5 mg total) by mouth daily.    acetaminophen (TYLENOL) 325 MG tablet Take 650 mg by mouth every 6 (six) hours as needed.    aspirin EC 81 MG tablet Take 1 tablet (81 mg total) by mouth daily.    Calcium Carbonate-Vitamin D (CALCIUM 600/VITAMIN D PO) Take 1 tablet by mouth daily. 03/18/2019: Calcium only   cholecalciferol (VITAMIN D3) 25 MCG (1000 UNIT) tablet Take 1,000 Units by mouth daily.    diclofenac Sodium (VOLTAREN ARTHRITIS PAIN) 1 % GEL Apply topically 4 (four) times daily.    gabapentin (NEURONTIN) 100 MG capsule Start 1 capsule daily, increase by 1 cap every 2-3 days as tolerated up to 3 times a day, or may take 3 at once in evening.    naproxen (NAPROSYN) 500 MG tablet Take 1 tablet (500 mg total) by mouth 2 (two) times daily with a meal. For 1-2 weeks then as needed    rosuvastatin (CRESTOR) 5 MG tablet TAKE 1 TABLET(5 MG) BY MOUTH AT BEDTIME    No facility-administered encounter medications on file as of 03/01/2022.    Lab Results  Component Value Date   CREATININE 0.80 09/21/2021   BUN 17 09/21/2021   NA 138 09/21/2021   K 3.7 09/21/2021   CL 105 09/21/2021   CO2 26 09/21/2021    BP Readings from Last 3 Encounters:   12/19/21 (!) 184/86  12/13/21 138/84  11/01/21 128/83    Pulse Readings from Last 3 Encounters:  12/19/21 63  12/13/21 73  11/01/21 65    Current medications:  HCTZ 12.5 mg daily Amlodipine 5 mg daily   During our last telephone call on 12/13 identified per review of dispensing history in chart, amlodipine 5 mg Rx last filled 06/28/2921 for 90 day supply and HCTZ 12.5 mg Rx last filled 03/17/2021 for 90 day supply Collaborated with PCP to request renewal of HCTZ Rx and requested [place] refill amlodipine Rx for patient  Today patient reports picked up and taking both her HCTZ 12.5 mg daily and amlodipine 5 mg daily  Note patient takes medications directly from pill bottles; denies missed doses - Denies interest in using weekly pillbox or daily phone alarm at this time - Reports occasionally takes her medications when she returns home if has an early appointment     Home Monitoring: Patient reports has an automated upper arm home BP machine  Denies having checked home blood pressure recently  Denies symptoms of hypotension such as dizziness or lightheadedness   Denies adding salt to her food.    Tobacco Use: Reports smokes ~1/2 pack/day - Reported has reduced smoking by using nicotine lozenges Reported interested in quitting, but not ready to set a quit date Motivation: lung cancer risk Triggers: work stress Strategies: listening to music  Assessment/Plan: - Reviewed goal blood pressure <130/80 - Have counseled on long term microvascular and macrovascular complications of uncontrolled hypertension - Have reviewed appropriate home BP monitoring technique (avoid caffeine, smoking, and exercise for 30 minutes before checking, rest for at least 5 minutes before taking BP, sit with feet flat on the floor and back against a hard surface, uncross legs, and rest arm on flat surface) - Reviewed to check blood pressure twice weekly, document, and provide at next  provider visit - Discussed dietary modifications, such as reduced salt intake, focus on whole grains, vegetables, lean proteins - Counsel patient on importance of medication adherence/strategies to aid with adherence Advise patient to take her blood pressure medications before any office visit, even if asked to fast for labs.    Follow Up Plan: CM Pharmacist will outreach to patient by telephone again next month.   Wallace Cullens, PharmD, [place] [phone]

## 2022-03-02 ENCOUNTER — Ambulatory Visit
Admission: RE | Admit: 2022-03-02 | Discharge: 2022-03-02 | Disposition: A | Discharge status: Home / Self Care | Payer: Medicare Other | Source: Ambulatory Visit | Attending: Physician Assistant | Admitting: Physician Assistant

## 2022-03-02 DIAGNOSIS — M660 Rupture of popliteal cyst: Secondary | ICD-10-CM | POA: Diagnosis not present

## 2022-03-02 DIAGNOSIS — M25462 Effusion, left knee: Secondary | ICD-10-CM | POA: Diagnosis not present

## 2022-03-02 DIAGNOSIS — M2392 Unspecified internal derangement of left knee: Secondary | ICD-10-CM | POA: Insufficient documentation

## 2022-03-02 DIAGNOSIS — S82142A Displaced bicondylar fracture of left tibia, initial encounter for closed fracture: Secondary | ICD-10-CM | POA: Diagnosis not present

## 2022-03-02 DIAGNOSIS — M23222 Derangement of posterior horn of medial meniscus due to old tear or injury, left knee: Secondary | ICD-10-CM | POA: Diagnosis not present

## 2022-03-08 ENCOUNTER — Encounter: Payer: Self-pay | Admitting: Radiation Oncology

## 2022-03-08 ENCOUNTER — Ambulatory Visit
Admission: RE | Admit: 2022-03-08 | Discharge: 2022-03-08 | Disposition: A | Discharge status: Home / Self Care | Payer: Medicare Other | Source: Ambulatory Visit | Attending: Radiation Oncology | Admitting: Radiation Oncology

## 2022-03-08 ENCOUNTER — Other Ambulatory Visit: Payer: Self-pay | Admitting: *Deleted

## 2022-03-08 VITALS — BP 139/93 | HR 86 | Temp 99.0°F | Resp 16 | Ht 61.0 in | Wt 173.0 lb

## 2022-03-08 DIAGNOSIS — Z923 Personal history of irradiation: Secondary | ICD-10-CM | POA: Insufficient documentation

## 2022-03-08 DIAGNOSIS — C3491 Malignant neoplasm of unspecified part of right bronchus or lung: Secondary | ICD-10-CM

## 2022-03-08 DIAGNOSIS — R911 Solitary pulmonary nodule: Secondary | ICD-10-CM | POA: Insufficient documentation

## 2022-03-08 NOTE — Progress Notes (Signed)
Radiation Oncology Follow up Note  Name: HAYDON KALMAR   Date:   03/08/2022 MRN:  [id number] DOB: 1951/02/04    This 72 y.o. female presents to the clinic today for 1 month follow-up status post ultra hypofractionated radiation therapy to her stage I non-small cell lung cancer of the left upper lobe.  REFERRING PROVIDER: Nobie Putnam *  HPI: Patient is a 72 year old female now out 1 month having completed ultra hypofractionated radiation therapy to her left upper lobe for presumed stage I non-small cell lung cancer.  Patient is status post right upper lobectomy in 2020 for non-small cell lung cancer.  She is seen today in routine follow-up is doing well specifically denies cough hemoptysis chest tightness or any change in her pulmonary status.  COMPLICATIONS OF TREATMENT: none  FOLLOW UP COMPLIANCE: keeps appointments   PHYSICAL EXAM:  BP (!) 139/93 Comment: Patient states she forgot to take her medication this morning  Pulse 86   Temp 99 F (37.2 C) (Tympanic)   Resp 16   Ht 5\' 1"  (1.549 m)   Wt 173 lb (78.5 kg)   BMI 32.69 kg/m  Well-developed well-nourished patient in NAD. HEENT reveals PERLA, EOMI, discs not visualized.  Oral cavity is clear. No oral mucosal lesions are identified. Neck is clear without evidence of cervical or supraclavicular adenopathy. Lungs are clear to A&P. Cardiac examination is essentially unremarkable with regular rate and rhythm without murmur rub or thrill. Abdomen is benign with no organomegaly or masses noted. Motor sensory and DTR levels are equal and symmetric in the upper and lower extremities. Cranial nerves II through XII are grossly intact. Proprioception is intact. No peripheral adenopathy or edema is identified. No motor or sensory levels are noted. Crude visual fields are within normal range.  RADIOLOGY RESULTS: CT scan of the chest ordered  PLAN: Present time patient is doing well no significant side effects from her SBRT.  I am  pleased with her overall progress have asked to see her back in 3 months with a repeat CT scan of the chest prior to that visit.  Patient is to call with any concerns.  I would like to take this opportunity to thank you for allowing me to participate in the care of your patient.Noreene Filbert, MD

## 2022-03-13 ENCOUNTER — Ambulatory Visit
Admission: RE | Admit: 2022-03-13 | Discharge: 2022-03-13 | Disposition: A | Discharge status: Home / Self Care | Payer: Medicare Other | Source: Ambulatory Visit | Attending: Family Medicine | Admitting: Family Medicine

## 2022-03-13 DIAGNOSIS — Z1231 Encounter for screening mammogram for malignant neoplasm of breast: Secondary | ICD-10-CM

## 2022-04-05 ENCOUNTER — Telehealth: Payer: 59

## 2022-04-21 ENCOUNTER — Ambulatory Visit (INDEPENDENT_AMBULATORY_CARE_PROVIDER_SITE_OTHER): Payer: 59

## 2022-04-21 VITALS — BP 124/76 | Ht 61.0 in | Wt 178.2 lb

## 2022-04-21 DIAGNOSIS — Z0000 Encounter for general adult medical examination without abnormal findings: Secondary | ICD-10-CM

## 2022-04-21 NOTE — Patient Instructions (Signed)
Joanna Morris , Thank you for taking time to come for your Medicare Wellness Visit. I appreciate your ongoing commitment to your health goals. Please review the following plan we discussed and let me know if I can assist you in the future.   These are the goals we discussed:  Goals      DIET - EAT MORE FRUITS AND VEGETABLES     DIET - INCREASE WATER INTAKE     Exercise 150 minutes per week (moderate activity)     Recommend gradually increasing distance walked from 1 mile to 2 miles     Patient Stated     03/23/2020, no goals     Quit Smoking     Pt would like to stop smoking completely.        This is a list of the screening recommended for you and due dates:  Health Maintenance  Topic Date Due   Pneumonia Vaccine (1 of 2 - PCV) Never done   DTaP/Tdap/Td vaccine (1 - Tdap) Never done   Zoster (Shingles) Vaccine (1 of 2) Never done   Stool Blood Test  12/21/2019   Flu Shot  10/04/2021   COVID-19 Vaccine (4 - 2023-24 season) 11/04/2021   Medicare Annual Wellness Visit  04/22/2023   Mammogram  03/13/2024   Colon Cancer Screening  12/09/2025   DEXA scan (bone density measurement)  Completed   Hepatitis C Screening: USPSTF Recommendation to screen - Ages 9-79 yo.  Completed   HPV Vaccine  Aged Out    Advanced directives: no  Conditions/risks identified: none  Next appointment: Follow up in one year for your annual wellness visit 04/27/23 @ 8:15 am in person   Preventive Care 65 Years and Older, Female Preventive care refers to lifestyle choices and visits with your health care provider that can promote health and wellness. What does preventive care include? A yearly physical exam. This is also called an annual well check. Dental exams once or twice a year. Routine eye exams. Ask your health care provider how often you should have your eyes checked. Personal lifestyle choices, including: Daily care of your teeth and gums. Regular physical activity. Eating a healthy  diet. Avoiding tobacco and drug use. Limiting alcohol use. Practicing safe sex. Taking low-dose aspirin every day. Taking vitamin and mineral supplements as recommended by your health care provider. What happens during an annual well check? The services and screenings done by your health care provider during your annual well check will depend on your age, overall health, lifestyle risk factors, and family history of disease. Counseling  Your health care provider may ask you questions about your: Alcohol use. Tobacco use. Drug use. Emotional well-being. Home and relationship well-being. Sexual activity. Eating habits. History of falls. Memory and ability to understand (cognition). Work and work Statistician. Reproductive health. Screening  You may have the following tests or measurements: Height, weight, and BMI. Blood pressure. Lipid and cholesterol levels. These may be checked every 5 years, or more frequently if you are over 88 years old. Skin check. Lung cancer screening. You may have this screening every year starting at age 69 if you have a 30-pack-year history of smoking and currently smoke or have quit within the past 15 years. Fecal occult blood test (FOBT) of the stool. You may have this test every year starting at age 57. Flexible sigmoidoscopy or colonoscopy. You may have a sigmoidoscopy every 5 years or a colonoscopy every 10 years starting at age 54. Hepatitis C blood  test. Hepatitis B blood test. Sexually transmitted disease (STD) testing. Diabetes screening. This is done by checking your blood sugar (glucose) after you have not eaten for a while (fasting). You may have this done every 1-3 years. Bone density scan. This is done to screen for osteoporosis. You may have this done starting at age 99. Mammogram. This may be done every 1-2 years. Talk to your health care provider about how often you should have regular mammograms. Talk with your health care provider about  your test results, treatment options, and if necessary, the need for more tests. Vaccines  Your health care provider may recommend certain vaccines, such as: Influenza vaccine. This is recommended every year. Tetanus, diphtheria, and acellular pertussis (Tdap, Td) vaccine. You may need a Td booster every 10 years. Zoster vaccine. You may need this after age 52. Pneumococcal 13-valent conjugate (PCV13) vaccine. One dose is recommended after age 57. Pneumococcal polysaccharide (PPSV23) vaccine. One dose is recommended after age 81. Talk to your health care provider about which screenings and vaccines you need and how often you need them. This information is not intended to replace advice given to you by your health care provider. Make sure you discuss any questions you have with your health care provider. Document Released: 03/19/2015 Document Revised: 11/10/2015 Document Reviewed: 12/22/2014 Elsevier Interactive Patient Education  2017 [place] Prevention in the Home Falls can cause injuries. They can happen to people of all ages. There are many things you can do to make your home safe and to help prevent falls. What can I do on the outside of my home? Regularly fix the edges of walkways and driveways and fix any cracks. Remove anything that might make you trip as you walk through a door, such as a raised step or threshold. Trim any bushes or trees on the path to your home. Use bright outdoor lighting. Clear any walking paths of anything that might make someone trip, such as rocks or tools. Regularly check to see if handrails are loose or broken. Make sure that both sides of any steps have handrails. Any raised decks and porches should have guardrails on the edges. Have any leaves, snow, or ice cleared regularly. Use sand or salt on walking paths during winter. Clean up any spills in your garage right away. This includes oil or grease spills. What can I do in the bathroom? Use  night lights. Install grab bars by the toilet and in the tub and shower. Do not use towel bars as grab bars. Use non-skid mats or decals in the tub or shower. If you need to sit down in the shower, use a plastic, non-slip stool. Keep the floor dry. Clean up any water that spills on the floor as soon as it happens. Remove soap buildup in the tub or shower regularly. Attach bath mats securely with double-sided non-slip rug tape. Do not have throw rugs and other things on the floor that can make you trip. What can I do in the bedroom? Use night lights. Make sure that you have a light by your bed that is easy to reach. Do not use any sheets or blankets that are too big for your bed. They should not hang down onto the floor. Have a firm chair that has side arms. You can use this for support while you get dressed. Do not have throw rugs and other things on the floor that can make you trip. What can I do in the kitchen? Clean  up any spills right away. Avoid walking on wet floors. Keep items that you use a lot in easy-to-reach places. If you need to reach something above you, use a strong step stool that has a grab bar. Keep electrical cords out of the way. Do not use floor polish or wax that makes floors slippery. If you must use wax, use non-skid floor wax. Do not have throw rugs and other things on the floor that can make you trip. What can I do with my stairs? Do not leave any items on the stairs. Make sure that there are handrails on both sides of the stairs and use them. Fix handrails that are broken or loose. Make sure that handrails are as long as the stairways. Check any carpeting to make sure that it is firmly attached to the stairs. Fix any carpet that is loose or worn. Avoid having throw rugs at the top or bottom of the stairs. If you do have throw rugs, attach them to the floor with carpet tape. Make sure that you have a light switch at the top of the stairs and the bottom of the  stairs. If you do not have them, ask someone to add them for you. What else can I do to help prevent falls? Wear shoes that: Do not have high heels. Have rubber bottoms. Are comfortable and fit you well. Are closed at the toe. Do not wear sandals. If you use a stepladder: Make sure that it is fully opened. Do not climb a closed stepladder. Make sure that both sides of the stepladder are locked into place. Ask someone to hold it for you, if possible. Clearly mark and make sure that you can see: Any grab bars or handrails. First and last steps. Where the edge of each step is. Use tools that help you move around (mobility aids) if they are needed. These include: Canes. Walkers. Scooters. Crutches. Turn on the lights when you go into a dark area. Replace any light bulbs as soon as they burn out. Set up your furniture so you have a clear path. Avoid moving your furniture around. If any of your floors are uneven, fix them. If there are any pets around you, be aware of where they are. Review your medicines with your doctor. Some medicines can make you feel dizzy. This can increase your chance of falling. Ask your doctor what other things that you can do to help prevent falls. This information is not intended to replace advice given to you by your health care provider. Make sure you discuss any questions you have with your health care provider. Document Released: 12/17/2008 Document Revised: 07/29/2015 Document Reviewed: 03/27/2014 Elsevier Interactive Patient Education  2017 [employer].

## 2022-04-21 NOTE — Progress Notes (Signed)
Subjective:   Joanna Morris is a 72 y.o. female who presents for Medicare Annual (Subsequent) preventive examination.  Review of Systems     Cardiac Risk Factors include: advanced age (>29men, >59 women);smoking/ tobacco exposure;hypertension;obesity (BMI >30kg/m2)     Objective:    Today's Vitals   04/21/22 0822  BP: 124/76  Weight: 178 lb 3.2 oz (80.8 kg)  Height: 5\' 1"  (1.549 m)   Body mass index is 33.67 kg/m.     04/21/2022    8:27 AM 03/08/2022   10:41 AM 10/10/2021    9:34 AM 09/21/2021    9:01 AM 04/15/2021    8:29 AM 03/25/2021    8:37 AM 09/23/2020    9:47 AM  Advanced Directives  Does Patient Have a Medical Advance Directive? No No No No No No No  Would patient like information on creating a medical advance directive? No - Patient declined No - Patient declined No - Patient declined No - Patient declined No - Patient declined No - Patient declined No - Patient declined    Current Medications (verified) Outpatient Encounter Medications as of 04/21/2022  Medication Sig   acetaminophen (TYLENOL) 325 MG tablet Take 650 mg by mouth every 6 (six) hours as needed.   aspirin EC 81 MG tablet Take 1 tablet (81 mg total) by mouth daily.   cholecalciferol (VITAMIN D3) 25 MCG (1000 UNIT) tablet Take 1,000 Units by mouth daily.   hydrochlorothiazide (MICROZIDE) 12.5 MG capsule Take 1 capsule (12.5 mg total) by mouth daily.   naproxen (NAPROSYN) 500 MG tablet Take 1 tablet (500 mg total) by mouth 2 (two) times daily with a meal. For 1-2 weeks then as needed   rosuvastatin (CRESTOR) 5 MG tablet TAKE 1 TABLET(5 MG) BY MOUTH AT BEDTIME   amLODipine (NORVASC) 5 MG tablet Take 1 tablet (5 mg total) by mouth daily. (Patient not taking: Reported on 04/21/2022)   Calcium Carbonate-Vitamin D (CALCIUM 600/VITAMIN D PO) Take 1 tablet by mouth daily.   diclofenac Sodium (VOLTAREN ARTHRITIS PAIN) 1 % GEL Apply topically 4 (four) times daily. (Patient not taking: Reported on 04/21/2022)    gabapentin (NEURONTIN) 100 MG capsule Start 1 capsule daily, increase by 1 cap every 2-3 days as tolerated up to 3 times a day, or may take 3 at once in evening. (Patient not taking: Reported on 03/08/2022)   No facility-administered encounter medications on file as of 04/21/2022.    Allergies (verified) Tramadol   History: Past Medical History:  Diagnosis Date   Allergy    Cataract    Genital warts    GERD (gastroesophageal reflux disease)    H/O   History of kidney stones    Hyperlipidemia    Hypertension    Lung nodule    Primary cancer of right lung (HCC)    Tobacco dependency    Past Surgical History:  Procedure Laterality Date   ABDOMINAL HYSTERECTOMY     CESAREAN SECTION     X2   KNEE ARTHROSCOPY WITH MEDIAL MENISECTOMY Right 05/22/2016   Procedure: KNEE ARTHROSCOPY WITH PARTIAL MEDIAL MENISECTOMY;  Surgeon: Dereck Leep, MD;  Location: [facility] ORS;  Service: Orthopedics;  Laterality: Right;   OOPHORECTOMY     THORACOTOMY Right 04/02/2018   Procedure: THORACOTOMY AND LUNG RESECTION WITH PREOP BRONCH;  Surgeon: Nestor Lewandowsky, MD;  Location: [facility] ORS;  Service: General;  Laterality: Right;   Family History  Problem Relation Age of Onset   Heart disease Mother 5  MI   Heart attack Mother 38   Diabetes Mother    Pancreatic cancer Sister    Ovarian cancer Sister    Breast cancer Neg Hx    Social History   Socioeconomic History   Marital status: Divorced    Spouse name: Not on file   Number of children: 2   Years of education: High School   Highest education level: Not on file  Occupational History   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 pt using nicorette gum     0.5 PPD 12/13/2021  Vaping Use   Vaping Use: Never used  Substance  and Sexual Activity   Alcohol use: Not Currently    Alcohol/week: 1.0 - 3.0 standard drink of alcohol    Types: 1 - 3 Standard drinks or equivalent per week    Comment: half a pint of liquor every week   Drug use: No   Sexual activity: Not Currently    Birth control/protection: Surgical  Other Topics Concern   Not on file  Social History Narrative   Lives at home by herself, independent at baseline   Social Determinants of Health   Financial Resource Strain: Low Risk  (04/21/2022)   Overall Financial Resource Strain (CARDIA)    Difficulty of Paying Living Expenses: Not very hard  Food Insecurity: No Food Insecurity (04/21/2022)   Hunger Vital Sign    Worried About Running Out of Food in the Last Year: Never true    Ran Out of Food in the Last Year: Never true  Transportation Needs: No Transportation Needs (04/21/2022)   PRAPARE - Hydrologist (Medical): No    Lack of Transportation (Non-Medical): No  Physical Activity: Sufficiently Active (04/21/2022)   Exercise Vital Sign    Days of Exercise per Week: 3 days    Minutes of Exercise per Session: 70 min  Stress: No Stress Concern Present (04/21/2022)   [place]    Feeling of Stress : Not at all  Social Connections: Moderately Isolated (04/21/2022)   Social Connection and Isolation Panel [NHANES]    Frequency of Communication with Friends and Family: More than three times a week    Frequency of Social Gatherings with Friends and Family: More than three times a week    Attends Religious Services: More than 4 times per year    Active Member of [employer] or Organizations: No    Attends Archivist Meetings: Never    Marital Status: Divorced    Tobacco Counseling Ready to quit: Not Answered Counseling given: Not Answered Tobacco comments: Quit once for 2 years. pt using nicorette gum  0.5 PPD 12/13/2021   Clinical  Intake:  Pre-visit preparation completed: Yes  Pain : No/denies pain     Nutritional Risks: None Diabetes: No  How often do you need to have someone help you when you read instructions, pamphlets, or other written materials from your doctor or pharmacy?: 1 - Never  Diabetic?no  Interpreter Needed?: No  Information entered by :: Kirke Shaggy, LPN   Activities of Daily Living    04/21/2022  8:29 AM 10/03/2021    9:46 AM  In your present state of health, do you have any difficulty performing the following activities:  Hearing? 0 0  Vision? 0 0  Difficulty concentrating or making decisions? 0 0  Walking or climbing stairs? 0 0  Dressing or bathing? 0 1  Comment  due to back pain  Doing errands, shopping? 0 0  Preparing Food and eating ? N   Using the Toilet? N   In the past six months, have you accidently leaked urine? N   Do you have problems with loss of bowel control? N   Managing your Medications? N   Managing your Finances? N   Housekeeping or managing your Housekeeping? N     Patient Care Team: Olin Hauser, DO as PCP - General (Family Medicine) Marry Guan, Laurice Record, MD as Consulting Physician (Orthopedic Surgery) Telford Nab, RN as Registered Nurse Curley Spice, Virl Diamond, RPH-CPP as Pharmacist (Pharmacist)  Indicate any recent Medical Services you may have received from other than [facility] providers in the past year (date may be approximate).     Assessment:   This is a routine wellness examination for [place].  Hearing/Vision screen Hearing Screening - Comments:: No aids Vision Screening - Comments:: No glasses  Dietary issues and exercise activities discussed: Current Exercise Habits: Home exercise routine, Type of exercise: walking, Time (Minutes): > 60, Frequency (Times/Week): >7, Weekly Exercise (Minutes/Week): 0, Intensity: Mild   Goals Addressed             This Visit's Progress    DIET - INCREASE WATER INTAKE         Depression  Screen    04/21/2022    8:25 AM 10/03/2021    9:46 AM 06/28/2021    8:35 AM 04/15/2021    8:27 AM 12/21/2020    8:27 AM 03/23/2020    8:27 AM 06/23/2019    8:24 AM  PHQ 2/9 Scores  PHQ - 2 Score 0 0 0 0 0 0 0  PHQ- 9 Score 0 0 0  0      Fall Risk    04/21/2022    8:27 AM 10/03/2021    9:46 AM 06/28/2021    8:25 AM 04/15/2021    8:30 AM 12/21/2020    8:26 AM  Fall Risk   Falls in the past year? 1 1 0 0 0  Number falls in past yr: 0 0 0 0 0  Injury with Fall? 0 0 0 0 0  Risk for fall due to : History of fall(s) History of fall(s) No Fall Risks No Fall Risks   Follow up Falls evaluation completed;Falls prevention discussed Falls evaluation completed Falls evaluation completed Falls evaluation completed Falls evaluation completed    FALL RISK PREVENTION PERTAINING TO THE HOME:  Any stairs in or around the home? No  If so, are there any without handrails? No  Home free of loose throw rugs in walkways, pet beds, electrical cords, etc? Yes  Adequate lighting in your home to reduce risk of falls? Yes   ASSISTIVE DEVICES UTILIZED TO PREVENT FALLS:  Life alert? No  Use of a cane, walker or w/c? No  Grab bars in the bathroom? No  Shower chair or bench in shower? No  Elevated toilet seat or a handicapped toilet? No   TIMED UP AND GO:  Was the test performed? Yes .  Length of time to ambulate 10 feet: 4 sec.   Gait steady and fast without use  of assistive device  Cognitive Function:        04/21/2022    8:35 AM 03/23/2020    8:28 AM 03/18/2019    8:44 AM 03/13/2018    8:36 AM 12/05/2016    9:34 AM  6CIT Screen  What Year? 0 points 0 points 0 points 0 points 0 points  What month? 0 points 0 points 0 points 0 points 0 points  What time? 0 points 0 points 0 points 0 points 0 points  Count back from 20 2 points 0 points 0 points 0 points 0 points  Months in reverse 0 points 0 points 0 points 0 points 0 points  Repeat phrase 4 points 0 points 0 points 2 points 2 points  Total Score  6 points 0 points 0 points 2 points 2 points    Immunizations Immunization History  Administered Date(s) Administered   Fluad Quad(high Dose 65+) 01/17/2019, 02/10/2020, 12/21/2020   Influenza, High Dose Seasonal PF 12/27/2016, 01/16/2018   PFIZER(Purple Top)SARS-COV-2 Vaccination 05/01/2019, 05/27/2019, 01/03/2020    TDAP status: Due, Education has been provided regarding the importance of this vaccine. Advised may receive this vaccine at local pharmacy or Health Dept. Aware to provide a copy of the vaccination record if obtained from local pharmacy or Health Dept. Verbalized acceptance and understanding.  Flu Vaccine status: Up to date  Pneumococcal vaccine status: Declined,  Education has been provided regarding the importance of this vaccine but patient still declined. Advised may receive this vaccine at local pharmacy or Health Dept. Aware to provide a copy of the vaccination record if obtained from local pharmacy or Health Dept. Verbalized acceptance and understanding.   Covid-19 vaccine status: Completed vaccines  Qualifies for Shingles Vaccine? Yes   Zostavax completed No   Shingrix Completed?: No.    Education has been provided regarding the importance of this vaccine. Patient has been advised to call insurance company to determine out of pocket expense if they have not yet received this vaccine. Advised may also receive vaccine at local pharmacy or Health Dept. Verbalized acceptance and understanding.  Screening Tests Health Maintenance  Topic Date Due   Pneumonia Vaccine 82+ Years old (1 of 2 - PCV) Never done   DTaP/Tdap/Td (1 - Tdap) Never done   Zoster Vaccines- Shingrix (1 of 2) Never done   COLON CANCER SCREENING ANNUAL FOBT  12/21/2019   INFLUENZA VACCINE  10/04/2021   COVID-19 Vaccine (4 - 2023-24 season) 11/04/2021   Medicare Annual Wellness (AWV)  04/22/2023   MAMMOGRAM  03/13/2024   COLONOSCOPY (Pts 45-5yrs Insurance coverage will need to be confirmed)   12/09/2025   DEXA SCAN  Completed   Hepatitis C Screening  Completed   HPV VACCINES  Aged Out    Health Maintenance  Health Maintenance Due  Topic Date Due   Pneumonia Vaccine 33+ Years old (1 of 2 - PCV) Never done   DTaP/Tdap/Td (1 - Tdap) Never done   Zoster Vaccines- Shingrix (1 of 2) Never done   COLON CANCER SCREENING ANNUAL FOBT  12/21/2019   INFLUENZA VACCINE  10/04/2021   COVID-19 Vaccine (4 - 2023-24 season) 11/04/2021    Colorectal cancer screening: Type of screening: FOBT/FIT. Completed 12/10/15. Repeat every 10 years  Mammogram status: Completed 03/13/22. Repeat every year  Bone Density status: Completed 01/08/17. Results reflect: Bone density results: OSTEOPENIA. Repeat every 5 years.- declined referral-   Lung Cancer Screening: (Low Dose CT Chest recommended if Age 68-80 years, 30 pack-year currently smoking OR  have quit w/in 15years.) does qualify.   Lung Cancer Screening Referral: gets scan every year, lung cancer, has appt in April  Additional Screening:  Hepatitis C Screening: does qualify; Completed 12/21/20  Vision Screening: Recommended annual ophthalmology exams for early detection of glaucoma and other disorders of the eye. Is the patient up to date with their annual eye exam?  Yes  Who is the provider or what is the name of the office in which the patient attends annual eye exams? [facility] If pt is not established with a provider, would they like to be referred to a provider to establish care? No .   Dental Screening: Recommended annual dental exams for proper oral hygiene  Community Resource Referral / Chronic Care Management: CRR required this visit?  No   CCM required this visit?  No      Plan:     I have personally reviewed and noted the following in the patient's chart:   Medical and social history Use of alcohol, tobacco or illicit drugs  Current medications and supplements including opioid prescriptions. Patient is not currently  taking opioid prescriptions. Functional ability and status Nutritional status Physical activity Advanced directives List of other physicians Hospitalizations, surgeries, and ER visits in previous 12 months Vitals Screenings to include cognitive, depression, and falls Referrals and appointments  In addition, I have reviewed and discussed with patient certain preventive protocols, quality metrics, and best practice recommendations. A written personalized care plan for preventive services as well as general preventive health recommendations were provided to patient.     Dionisio David, LPN   [id number]   Nurse Notes: none

## 2022-06-05 ENCOUNTER — Ambulatory Visit
Admission: RE | Admit: 2022-06-05 | Discharge: 2022-06-05 | Disposition: A | Discharge status: Home / Self Care | Payer: 59 | Source: Ambulatory Visit | Attending: Radiation Oncology | Admitting: Radiation Oncology

## 2022-06-05 DIAGNOSIS — K760 Fatty (change of) liver, not elsewhere classified: Secondary | ICD-10-CM | POA: Diagnosis not present

## 2022-06-05 DIAGNOSIS — C3491 Malignant neoplasm of unspecified part of right bronchus or lung: Secondary | ICD-10-CM | POA: Diagnosis not present

## 2022-06-05 DIAGNOSIS — C3490 Malignant neoplasm of unspecified part of unspecified bronchus or lung: Secondary | ICD-10-CM | POA: Diagnosis not present

## 2022-06-05 DIAGNOSIS — J432 Centrilobular emphysema: Secondary | ICD-10-CM | POA: Diagnosis not present

## 2022-06-05 MED ORDER — IOHEXOL 300 MG/ML  SOLN
75.0000 mL | Freq: Once | INTRAMUSCULAR | Status: AC | PRN
  Administered 2022-06-05: 75 mL via INTRAVENOUS

## 2022-06-08 ENCOUNTER — Ambulatory Visit
Admission: RE | Admit: 2022-06-08 | Discharge: 2022-06-08 | Disposition: A | Discharge status: Home / Self Care | Payer: 59 | Source: Ambulatory Visit | Attending: Radiation Oncology | Admitting: Radiation Oncology

## 2022-06-08 ENCOUNTER — Encounter: Payer: Self-pay | Admitting: Radiation Oncology

## 2022-06-08 VITALS — BP 155/90 | HR 77 | Temp 97.7°F | Resp 20 | Ht 61.0 in | Wt 175.3 lb

## 2022-06-08 DIAGNOSIS — C3491 Malignant neoplasm of unspecified part of right bronchus or lung: Secondary | ICD-10-CM | POA: Diagnosis not present

## 2022-06-08 DIAGNOSIS — R911 Solitary pulmonary nodule: Secondary | ICD-10-CM | POA: Diagnosis not present

## 2022-06-08 DIAGNOSIS — C3412 Malignant neoplasm of upper lobe, left bronchus or lung: Secondary | ICD-10-CM | POA: Diagnosis not present

## 2022-06-08 DIAGNOSIS — Z923 Personal history of irradiation: Secondary | ICD-10-CM | POA: Diagnosis not present

## 2022-06-08 DIAGNOSIS — F17210 Nicotine dependence, cigarettes, uncomplicated: Secondary | ICD-10-CM | POA: Diagnosis not present

## 2022-06-08 NOTE — Progress Notes (Signed)
Radiation Oncology Follow up Note  Name: Joanna Morris   Date:   06/08/2022 MRN:  [id number] DOB: Jul 19, 1950    This 72 y.o. female presents to the clinic today for 68-month follow-up status post ultra hypofractionated radiation therapy to a stage I non-small cell lung cancer of the left upper lobe  REFERRING PROVIDER: Nobie Putnam *  HPI: Patient is a 72 year old female now out for months having completed SBRT to her left upper lobe for stage I non-small cell lung cancer seen today in routine follow-up she is doing well.  She specifically denies cough hemoptysis chest tightness or any change in pulmonary status..  She had a recent CT scan showing response to therapy the central left upper lobe pulmonary nodule.  She is status post right upper lobectomy no findings of recurrent or metastatic disease.  COMPLICATIONS OF TREATMENT: none  FOLLOW UP COMPLIANCE: keeps appointments   PHYSICAL EXAM:  BP (!) 155/90 Comment: Recheck patient will monitor at home and contact PCP if BP remains elevated  Pulse 77   Temp 97.7 F (36.5 C) (Tympanic)   Resp 20   Ht 5\' 1"  (1.549 m)   Wt 175 lb 4.8 oz (79.5 kg)   BMI 33.12 kg/m  Well-developed well-nourished patient in NAD. HEENT reveals PERLA, EOMI, discs not visualized.  Oral cavity is clear. No oral mucosal lesions are identified. Neck is clear without evidence of cervical or supraclavicular adenopathy. Lungs are clear to A&P. Cardiac examination is essentially unremarkable with regular rate and rhythm without murmur rub or thrill. Abdomen is benign with no organomegaly or masses noted. Motor sensory and DTR levels are equal and symmetric in the upper and lower extremities. Cranial nerves II through XII are grossly intact. Proprioception is intact. No peripheral adenopathy or edema is identified. No motor or sensory levels are noted. Crude visual fields are within normal range.  RADIOLOGY RESULTS: CT scan reviewed compatible with above-stated  findings  PLAN: Present time patient is doing well excellent response to SBRT treatment with very low side effect profile.  I will see her back in 6 months for follow-up with repeat CT scan of the chest at that time.  Patient is to call with any concerns.  I would like to take this opportunity to thank you for allowing me to participate in the care of your patient.Noreene Filbert, MD

## 2022-07-04 DIAGNOSIS — M1711 Unilateral primary osteoarthritis, right knee: Secondary | ICD-10-CM | POA: Diagnosis not present

## 2022-07-04 DIAGNOSIS — G8929 Other chronic pain: Secondary | ICD-10-CM | POA: Diagnosis not present

## 2022-12-08 ENCOUNTER — Ambulatory Visit
Admission: RE | Admit: 2022-12-08 | Discharge: 2022-12-08 | Disposition: A | Discharge status: Home / Self Care | Payer: 59 | Source: Ambulatory Visit | Attending: Radiation Oncology | Admitting: Radiation Oncology

## 2022-12-08 DIAGNOSIS — K449 Diaphragmatic hernia without obstruction or gangrene: Secondary | ICD-10-CM | POA: Diagnosis not present

## 2022-12-08 DIAGNOSIS — C3491 Malignant neoplasm of unspecified part of right bronchus or lung: Secondary | ICD-10-CM | POA: Insufficient documentation

## 2022-12-08 DIAGNOSIS — C3490 Malignant neoplasm of unspecified part of unspecified bronchus or lung: Secondary | ICD-10-CM | POA: Diagnosis not present

## 2022-12-08 MED ORDER — IOHEXOL 300 MG/ML  SOLN
75.0000 mL | Freq: Once | INTRAMUSCULAR | Status: AC | PRN
  Administered 2022-12-08: 75 mL via INTRAVENOUS

## 2022-12-13 ENCOUNTER — Ambulatory Visit: Payer: 59 | Attending: Radiation Oncology | Admitting: Radiation Oncology

## 2023-01-11 ENCOUNTER — Ambulatory Visit
Admission: RE | Admit: 2023-01-11 | Discharge: 2023-01-11 | Disposition: A | Discharge status: Home / Self Care | Payer: 59 | Source: Ambulatory Visit | Attending: Radiation Oncology | Admitting: Radiation Oncology

## 2023-01-11 ENCOUNTER — Encounter: Payer: Self-pay | Admitting: Radiation Oncology

## 2023-01-11 VITALS — HR 80 | Temp 97.0°F | Resp 16 | Ht 61.0 in | Wt 173.0 lb

## 2023-01-11 DIAGNOSIS — C3491 Malignant neoplasm of unspecified part of right bronchus or lung: Secondary | ICD-10-CM | POA: Insufficient documentation

## 2023-01-11 DIAGNOSIS — F17210 Nicotine dependence, cigarettes, uncomplicated: Secondary | ICD-10-CM | POA: Diagnosis not present

## 2023-01-11 DIAGNOSIS — C3412 Malignant neoplasm of upper lobe, left bronchus or lung: Secondary | ICD-10-CM | POA: Diagnosis not present

## 2023-01-11 NOTE — Progress Notes (Signed)
Radiation Oncology Follow up Note  Name: Joanna Morris   Date:   01/11/2023 MRN:  [id number] DOB: Mar 30, 1950    This 72 y.o. female presents to the clinic today for 25-month follow-up status post ultra hypofractionated radiation therapy for stage I non-small cell lung cancer of the left upper lobe.  REFERRING PROVIDER: Saralyn Pilar *  HPI: 72 year old female now 10 months having completed left upper lobe.  Seen today in routine follow-up she is doing well.  She specifically has cough, this is chest tightness or any change in her pulmonary status..  She had a recent CT scan showing status post right upper lobectomy patchy lingular opacity favoring radiation changes with no evidence of metastatic disease  COMPLICATIONS OF TREATMENT: none  FOLLOW UP COMPLIANCE: keeps appointments   PHYSICAL EXAM:  Pulse 80   Temp (!) 97 F (36.1 C)   Resp 16   Ht 5\' 1"  (1.549 m)   Wt 173 lb (78.5 kg)   BMI 32.69 kg/m  Well-developed well-nourished patient in NAD. HEENT reveals PERLA, EOMI, discs not visualized.  Oral cavity is clear. No oral mucosal lesions are identified. Neck is clear without evidence of cervical or supraclavicular adenopathy. Lungs are clear to A&P. Cardiac examination is essentially unremarkable with regular rate and rhythm without murmur rub or thrill. Abdomen is benign with no organomegaly or masses noted. Motor sensory and DTR levels are equal and symmetric in the upper and lower extremities. Cranial nerves II through XII are grossly intact. Proprioception is intact. No peripheral adenopathy or edema is identified. No motor or sensory levels are noted. Crude visual fields are within normal range.  RADIOLOGY RESULTS: CT scans reviewed compatible with above-stated findings  PLAN: At the present time patient is doing well with no evidence of disease by CT criteria and pleased with her overall progress.  I have asked to see her back in 6 months with a repeat CT scan at that  time and then will go to once year follow-up visits.  Patient knows to call with any concerns at any time.  I would like to take this opportunity to thank you for allowing me to participate in the care of your patient.Carmina Miller, MD

## 2023-02-12 ENCOUNTER — Encounter: Payer: Self-pay | Admitting: Family Medicine

## 2023-02-12 DIAGNOSIS — Z1231 Encounter for screening mammogram for malignant neoplasm of breast: Secondary | ICD-10-CM

## 2023-03-21 DIAGNOSIS — M2392 Unspecified internal derangement of left knee: Secondary | ICD-10-CM | POA: Diagnosis not present

## 2023-03-29 ENCOUNTER — Ambulatory Visit: Payer: Self-pay

## 2023-03-29 NOTE — Telephone Encounter (Signed)
     Chief Complaint: Left foot pain Symptoms: Pain Frequency: 3 days ago Pertinent Negatives: Patient denies  Disposition: [] ED /[] Urgent Care (no appt availability in office) / [x] Appointment(In office/virtual)/ []  [facility]/ [] Home Care/ [] Refused Recommended Disposition /[] [facility]/ []  Follow-up with PCP Additional Notes: Agrees with appointment.  Reason for Disposition  [1] Swollen foot AND [2] no fever  (Exceptions: localized bump from bunions, calluses, insect bite, sting)  Answer Assessment - Initial Assessment Questions 1. ONSET: "When did the pain start?"      2 days ago 2. LOCATION: "Where is the pain located?"      Left 3. PAIN: "How bad is the pain?"    (Scale 1-10; or mild, moderate, severe)  - MILD (1-3): doesn't interfere with normal activities.   - MODERATE (4-7): interferes with normal activities (e.g., work or school) or awakens from sleep, limping.   - SEVERE (8-10): excruciating pain, unable to do any normal activities, unable to walk.      10 4. WORK OR EXERCISE: "Has there been any recent work or exercise that involved this part of the body?"      No 5. CAUSE: "What do you think is causing the foot pain?"     Gout 6. OTHER SYMPTOMS: "Do you have any other symptoms?" (e.g., leg pain, rash, fever, numbness)     No 7. PREGNANCY: "Is there any chance you are pregnant?" "When was your last menstrual period?"     No  Protocols used: Foot Pain-A-AH

## 2023-03-30 ENCOUNTER — Encounter: Payer: Self-pay | Admitting: Family Medicine

## 2023-03-30 ENCOUNTER — Ambulatory Visit (INDEPENDENT_AMBULATORY_CARE_PROVIDER_SITE_OTHER): Payer: 59 | Admitting: Family Medicine

## 2023-03-30 ENCOUNTER — Other Ambulatory Visit: Payer: Self-pay | Admitting: Family Medicine

## 2023-03-30 VITALS — BP 164/100 | HR 67 | Ht 61.0 in | Wt 170.0 lb

## 2023-03-30 DIAGNOSIS — M109 Gout, unspecified: Secondary | ICD-10-CM

## 2023-03-30 DIAGNOSIS — M5441 Lumbago with sciatica, right side: Secondary | ICD-10-CM

## 2023-03-30 DIAGNOSIS — E7800 Pure hypercholesterolemia, unspecified: Secondary | ICD-10-CM

## 2023-03-30 DIAGNOSIS — I10 Essential (primary) hypertension: Secondary | ICD-10-CM

## 2023-03-30 MED ORDER — ROSUVASTATIN CALCIUM 5 MG PO TABS: 5.0000 mg | ORAL_TABLET | Freq: Every day | ORAL | 1 refills | Status: DC

## 2023-03-30 MED ORDER — VALSARTAN 80 MG PO TABS: 80.0000 mg | ORAL_TABLET | Freq: Every day | ORAL | 1 refills | Status: DC

## 2023-03-30 MED ORDER — NAPROXEN 500 MG PO TABS: 500.0000 mg | ORAL_TABLET | Freq: Two times a day (BID) | ORAL | 2 refills | Status: DC

## 2023-03-30 MED ORDER — GABAPENTIN 100 MG PO CAPS: 100.0000 mg | ORAL_CAPSULE | Freq: Every day | ORAL | 1 refills | Status: DC

## 2023-03-30 MED ORDER — PREDNISONE 20 MG PO TABS: ORAL_TABLET | ORAL | 0 refills | Status: DC

## 2023-03-30 MED ORDER — AMLODIPINE BESYLATE 5 MG PO TABS: 5.0000 mg | ORAL_TABLET | Freq: Every day | ORAL | 1 refills | Status: DC

## 2023-03-30 NOTE — Patient Instructions (Addendum)
Thank you for coming to the office today.  Refilled meds  NEW BP med Valsartan 80mg  daily, instead of hydrochlorothiazide previous BP pill.  Plan on labs next time  Your Left Ankle pain is most likely caused an Acute Gout Flare - Gout is a chronic problem that will have episodic flare ups with pain, redness, swelling of a joint, most common spots are big toe, foot and ankle, knee or sometimes hands or wrists. It is caused by small crystals made of Uric Acid that form in the joint causing pain and swelling.  Likely gout  Start Prednisone taper over 7 days, do not mix with ibuprofen advil aleve naproxen  After 7 days and finish prednisone, okay to take Naproxen if needed for flares.  Gout flares can repeat again soon after they resolve in the same spot or other joints, and may need repeat treatment.  Our goal is to prevent future gout flares. Try to avoid dietary triggers that are the most common causes of gout flares. - Avoid the following foods/drinks: - Red meat, organ meat (liver) - Alcohol (especially beer, also wine, liquor) - Processed foods / carbs (white bread, white rice, pasta, sugar) - Sugary drinks (sweet tea, soda) - Shellfish, shrimp / lobster  - Foods that are preferred to eat: - Beans, Lentils, Whole grains, Quinoa - Fruits, Vegetables - Dairy, Cheese, Yogurt - Soy based protein   Please schedule a Follow-up Appointment to: Return in about 4 weeks (around 04/27/2023).  If you have any other questions or concerns, please feel free to call the office or send a message through MyChart. You may also schedule an earlier appointment if necessary.  Additionally, you may be receiving a survey about your experience at our office within a few days to 1 week by e-mail or mail. We value your feedback.  Joanna Pilar, DO [facility], [place]

## 2023-03-30 NOTE — Progress Notes (Signed)
Subjective:    Patient ID: Joanna Morris, female    DOB: 1950/11/08, 73 y.o.   MRN: [id number]  Joanna Morris is a 73 y.o. female presenting on 03/30/2023 for Ankle Pain  Patient presents for a same day appointment.   HPI  Discussed the use of AI scribe software for clinical note transcription with the patient, who gave verbal consent to proceed.  History of Present Illness    Acute Gout Flare Left Ankle / Foot  The patient, with a history of hypertension and hyperlipidemia, presents with acute onset of left foot pain, specifically in the heel and under the ankle, which started a few days prior. The pain is severe enough to prevent the patient from wearing a shoe or putting weight on the foot, and even light touch exacerbates the discomfort. The patient denies any recent injury, fall, or trauma to the foot. The foot is described as warm and slightly swollen, but there is no pain in the toe or outside of the ankle. The patient's brother suggested it might be gout and gave the patient a gout medication, which provided some relief and allowed the patient to sleep.  The patient also mentions a history of back pain, for which she had been taking gabapentin, but she ran out of this medication along with her hypertension and hyperlipidemia medications. The patient reports that the gabapentin was helpful for her back pain  Admits some dietary intake of fresh sausage could have provoked the gout flare.  No prior uric acid level  Last visit with me 2023.  Hypertension Elevated BP Not checking BP at home Off Amlodipine 5mg  and hydrochlorothiazide 12.5mg  daily needs re order     04/21/2022    8:25 AM 10/03/2021    9:46 AM 06/28/2021    8:35 AM  Depression screen PHQ 2/9  Decreased Interest 0 0 0  Down, Depressed, Hopeless 0 0 0  PHQ - 2 Score 0 0 0  Altered sleeping 0 0 0  Tired, decreased energy 0 0 0  Change in appetite 0 0 0  Feeling bad or failure about yourself  0 0 0   Trouble concentrating 0 0 0  Moving slowly or fidgety/restless 0 0 0  Suicidal thoughts 0 0 0  PHQ-9 Score 0 0 0  Difficult doing work/chores Not difficult at all Not difficult at all Not difficult at all       10/03/2021    9:46 AM 06/28/2021    8:35 AM 12/21/2020    8:27 AM  GAD 7 : Generalized Anxiety Score  Nervous, Anxious, on Edge 0 0 0  Control/stop worrying 0 0 0  Worry too much - different things 0 0 0  Trouble relaxing 0 0 0  Restless 0 0 0  Easily annoyed or irritable 0 0 0  Afraid - awful might happen 0 0 0  Total GAD 7 Score 0 0 0  Anxiety Difficulty Not difficult at all Not difficult at all Not difficult at all    Social History   Tobacco Use   Smoking status: Every Day    Current packs/day: 1.00    Average packs/day: 1 pack/day for 51.0 years (51.0 ttl pk-yrs)    Types: Cigarettes   Smokeless tobacco: Never   Tobacco comments:    Quit once for 2 years. pt using nicorette gum     0.5 PPD 12/13/2021  Vaping Use   Vaping status: Never Used  Substance Use Topics   Alcohol  use: Not Currently    Alcohol/week: 1.0 - 3.0 standard drink of alcohol    Types: 1 - 3 Standard drinks or equivalent per week    Comment: half a pint of liquor every week   Drug use: No    Review of Systems Per HPI unless specifically indicated above     Objective:    BP (!) 164/100 (BP Location: Left Arm, Cuff Size: Normal)   Pulse 67   Ht 5\' 1"  (1.549 m)   Wt 170 lb (77.1 kg)   SpO2 94%   BMI 32.12 kg/m   Wt Readings from Last 3 Encounters:  03/30/23 170 lb (77.1 kg)  01/11/23 173 lb (78.5 kg)  06/08/22 175 lb 4.8 oz (79.5 kg)    Physical Exam Vitals and nursing note reviewed.  Constitutional:      General: She is not in acute distress.    Appearance: Normal appearance. She is well-developed. She is not diaphoretic.     Comments: Well-appearing, comfortable, cooperative  HENT:     Head: Normocephalic and atraumatic.  Eyes:     General:        Right eye: No  discharge.        Left eye: No discharge.     Conjunctiva/sclera: Conjunctivae normal.  Cardiovascular:     Rate and Rhythm: Normal rate.  Pulmonary:     Effort: Pulmonary effort is normal.  Skin:    General: Skin is warm and dry.     Findings: No erythema or rash.  Neurological:     Mental Status: She is alert and oriented to person, place, and time.  Psychiatric:        Mood and Affect: Mood normal.        Behavior: Behavior normal.        Thought Content: Thought content normal.     Comments: Well groomed, good eye contact, normal speech and thoughts     Results for orders placed or performed in visit on 01/31/22  Rad Onc Aria Session Summary   Collection Time: 01/31/22 10:29 AM  Result Value Ref Range   Course ID [email or web address]    Course Intent Curative    Course Start Date 12/26/2021 10:17 AM    Session Number 10    Course First Treatment Date 01/16/2022 10:20 AM    Course Last Treatment Date 01/31/2022 10:28 AM    Course Elapsed Days 15    Reference Point ID Lt Lung DP    Reference Point Dosage Given to Date 50 Gy   Reference Point Session Dosage Given 5 Gy   Plan ID [email or web address]    Plan Fractions Treated to Date 10    Plan Total Fractions Prescribed 10    Plan Prescribed Dose Per Fraction 5 Gy   Plan Total Prescribed Dose 50.000000 Gy   Plan Primary Reference Point Lt Lung DP       Assessment & Plan:   Problem List Items Addressed This Visit     Elevated LDL cholesterol level   Essential hypertension   Relevant Medications   valsartan (DIOVAN) 80 MG tablet   Other Visit Diagnoses       Acute gout of left ankle, unspecified cause    -  Primary   Relevant Medications   predniSONE (DELTASONE) 20 MG tablet   naproxen (NAPROSYN) 500 MG tablet     Acute right-sided low back pain with right-sided sciatica       Relevant Medications   predniSONE (DELTASONE) 20  MG tablet   naproxen (NAPROSYN) 500 MG tablet        Acute Gout Left Ankle Sudden onset of pain in  the left foot, specifically the Achilles tendon area, with no prior injury or trauma. The area is warm, red, and sensitive to touch. The patient reported relief from a gout medication given by a family member.  Suspect Gout attack acute  -Start Prednisone taper over 7 days. -After 7 days, patient may take Naproxen as needed for flares. -Plan to check gout level and chemistry at next visit. Dietary restrictions to avoid future flare  Hypertension Uncontrolled off meds Blood pressure elevated with pain as well -Refill all current medications. Amlodipine 5mg  daily -Replace Hydrochlorothiazide with Valsartan 80mg  daily for gout benefit of ARB instead of risk provoking with HCTZ -Plan to check chemistry at next visit.  Follow-up appointment scheduled for 04/27/2023 to assess response to new blood pressure medication and confirm gout diagnosis.        No orders of the defined types were placed in this encounter.   Meds ordered this encounter  Medications   predniSONE (DELTASONE) 20 MG tablet    Sig: Take daily with food. Start with 60mg  (3 pills) x 2 days, then reduce to 40mg  (2 pills) x 2 days, then 20mg  (1 pill) x 3 days    Dispense:  13 tablet    Refill:  0   valsartan (DIOVAN) 80 MG tablet    Sig: Take 1 tablet (80 mg total) by mouth daily.    Dispense:  90 tablet    Refill:  1   naproxen (NAPROSYN) 500 MG tablet    Sig: Take 1 tablet (500 mg total) by mouth 2 (two) times daily with a meal. For 1-2 weeks then as needed    Dispense:  60 tablet    Refill:  2    Follow up plan: Return in about 4 weeks (around 04/27/2023).  Future labs at next apt, will be ordered for Uric Acid and Chemistry.   Saralyn Pilar, DO [facility] 03/30/2023, 11:15 AM

## 2023-04-27 ENCOUNTER — Ambulatory Visit: Payer: 59

## 2023-04-27 ENCOUNTER — Ambulatory Visit: Payer: 59 | Admitting: Family Medicine

## 2023-04-27 DIAGNOSIS — Z1231 Encounter for screening mammogram for malignant neoplasm of breast: Secondary | ICD-10-CM

## 2023-04-27 DIAGNOSIS — Z780 Asymptomatic menopausal state: Secondary | ICD-10-CM | POA: Diagnosis not present

## 2023-04-27 DIAGNOSIS — Z0000 Encounter for general adult medical examination without abnormal findings: Secondary | ICD-10-CM

## 2023-04-27 NOTE — Progress Notes (Signed)
Subjective:   Joanna Morris is a 73 y.o. female who presents for Medicare Annual (Subsequent) preventive examination.  Visit Complete: Virtual I connected with  Joanna Morris on 04/27/23 by a audio enabled telemedicine application and verified that I am speaking with the correct person using two identifiers.  This patient declined Interactive audio and Acupuncturist. Therefore the visit was completed with audio only.   Patient Location: Home  Provider Location: Office/Clinic  I discussed the limitations of evaluation and management by telemedicine. The patient expressed understanding and agreed to proceed.  Vital Signs: Because this visit was a virtual/telehealth visit, some criteria may be missing or patient reported. Any vitals not documented were not able to be obtained and vitals that have been documented are patient reported.  Cardiac Risk Factors include: advanced age (>57men, >45 women);hypertension;obesity (BMI >30kg/m2)     Objective:    Today's Vitals   04/27/23 0814  PainSc: 4    There is no height or weight on file to calculate BMI.     04/27/2023    8:20 AM 06/08/2022   11:04 AM 04/21/2022    8:27 AM 03/08/2022   10:41 AM 10/10/2021    9:34 AM 09/21/2021    9:01 AM 04/15/2021    8:29 AM  Advanced Directives  Does Patient Have a Medical Advance Directive? No No No No No No No  Would patient like information on creating a medical advance directive? No - Patient declined No - Patient declined No - Patient declined No - Patient declined No - Patient declined No - Patient declined No - Patient declined    Current Medications (verified) Outpatient Encounter Medications as of 04/27/2023  Medication Sig   acetaminophen (TYLENOL) 325 MG tablet Take 650 mg by mouth every 6 (six) hours as needed.   amLODipine (NORVASC) 5 MG tablet Take 1 tablet (5 mg total) by mouth daily.   aspirin EC 81 MG tablet Take 1 tablet (81 mg total) by mouth daily.   Calcium  Carbonate-Vitamin D (CALCIUM 600/VITAMIN D PO) Take 1 tablet by mouth daily.   cholecalciferol (VITAMIN D3) 25 MCG (1000 UNIT) tablet Take 1,000 Units by mouth daily.   diclofenac Sodium (VOLTAREN ARTHRITIS PAIN) 1 % GEL Apply topically 4 (four) times daily.   gabapentin (NEURONTIN) 100 MG capsule Take 1 capsule (100 mg total) by mouth at bedtime.   naproxen (NAPROSYN) 500 MG tablet Take 1 tablet (500 mg total) by mouth 2 (two) times daily with a meal. For 1-2 weeks then as needed   rosuvastatin (CRESTOR) 5 MG tablet Take 1 tablet (5 mg total) by mouth daily.   valsartan (DIOVAN) 80 MG tablet Take 1 tablet (80 mg total) by mouth daily.   predniSONE (DELTASONE) 20 MG tablet Take daily with food. Start with 60mg  (3 pills) x 2 days, then reduce to 40mg  (2 pills) x 2 days, then 20mg  (1 pill) x 3 days (Patient not taking: Reported on 04/27/2023)   No facility-administered encounter medications on file as of 04/27/2023.    Allergies (verified) Tramadol   History: Past Medical History:  Diagnosis Date   Allergy    Cataract    Genital warts    GERD (gastroesophageal reflux disease)    H/O   History of kidney stones    Hyperlipidemia    Hypertension    Lung nodule    Primary cancer of right lung (HCC)    Tobacco dependency    Past Surgical History:  Procedure Laterality  Date   ABDOMINAL HYSTERECTOMY     CESAREAN SECTION     X2   KNEE ARTHROSCOPY WITH MEDIAL MENISECTOMY Right 05/22/2016   Procedure: KNEE ARTHROSCOPY WITH PARTIAL MEDIAL MENISECTOMY;  Surgeon: Donato Heinz, MD;  Location: [facility] ORS;  Service: Orthopedics;  Laterality: Right;   OOPHORECTOMY     THORACOTOMY Right 04/02/2018   Procedure: THORACOTOMY AND LUNG RESECTION WITH PREOP BRONCH;  Surgeon: Hulda Marin, MD;  Location: [facility] ORS;  Service: General;  Laterality: Right;   Family History  Problem Relation Age of Onset   Heart disease Mother 87       MI   Heart attack Mother 59   Diabetes Mother    Pancreatic cancer  Sister    Ovarian cancer Sister    Breast cancer Neg Hx    Social History   Socioeconomic History   Marital status: Divorced    Spouse name: Not on file   Number of children: 2   Years of education: High School   Highest education level: Not on file  Occupational History   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 pt using nicorette gum     0.5 PPD 12/13/2021  Vaping Use   Vaping status: Never Used  Substance and Sexual Activity   Alcohol use: Not Currently    Alcohol/week: 1.0 - 3.0 standard drink of alcohol    Types: 1 - 3 Standard drinks or equivalent per week    Comment: half a pint of liquor every week   Drug use: No   Sexual activity: Not Currently    Birth control/protection: Surgical  Other Topics Concern   Not on file  Social History Narrative   Lives at home by herself, independent at baseline   Social Drivers of Health   Financial Resource Strain: Low Risk  (04/27/2023)   Overall Financial Resource Strain (CARDIA)    Difficulty of Paying Living Expenses: Not hard at all  Food Insecurity: No Food Insecurity (04/27/2023)   Hunger Vital Sign    Worried About Running Out of Food in the Last Year: Never true    Ran Out of Food in the Last Year: Never true  Transportation Needs: No Transportation Needs (04/27/2023)   PRAPARE - Administrator, Civil Service (Medical): No    Lack of Transportation (Non-Medical): No  Physical Activity: Sufficiently Active (04/27/2023)   Exercise Vital Sign    Days of Exercise per Week: 5 days    Minutes of Exercise per Session: 120 min  Stress: No Stress Concern Present (04/27/2023)   [employer] of Occupational Health - Occupational Stress Questionnaire    Feeling of  Stress : Not at all  Social Connections: Moderately Isolated (04/27/2023)   Social Connection and Isolation Panel [NHANES]    Frequency of Communication with Friends and Family: More than three times a week    Frequency of Social Gatherings with Friends and Family: More than three times a week    Attends Religious Services: More than 4 times per year    Active Member of [employer] or Organizations: No    Attends Banker Meetings: Never  Marital Status: Divorced    Tobacco Counseling Ready to quit: Not Answered Counseling given: Not Answered Tobacco comments: Quit once for 2 years. pt using nicorette gum  0.5 PPD 12/13/2021   Clinical Intake:  Pre-visit preparation completed: Yes  Pain : 0-10 Pain Score: 4  Pain Type: Chronic pain Pain Location: Knee Pain Orientation: Right Pain Descriptors / Indicators: Aching Pain Onset: More than a month ago Pain Frequency: Constant     BMI - recorded: 32.1 Nutritional Status: BMI > 30  Obese Nutritional Risks: None Diabetes: No  How often do you need to have someone help you when you read instructions, pamphlets, or other written materials from your doctor or pharmacy?: 1 - Never  Interpreter Needed?: No  Information entered by :: Kennedy Bucker, LPN   Activities of Daily Living    04/27/2023    8:21 AM  In your present state of health, do you have any difficulty performing the following activities:  Hearing? 0  Vision? 0  Difficulty concentrating or making decisions? 0  Walking or climbing stairs? 0  Dressing or bathing? 0  Doing errands, shopping? 0  Preparing Food and eating ? N  Using the Toilet? N  In the past six months, have you accidently leaked urine? N  Do you have problems with loss of bowel control? N  Managing your Medications? N  Managing your Finances? N  Housekeeping or managing your Housekeeping? N    Patient Care Team: Smitty Cords, DO as PCP - General (Family Medicine) Ernest Pine,  Illene Labrador, MD as Consulting Physician (Orthopedic Surgery) Glory Buff, RN as Registered Nurse Pa, [facility] (Optometry)  Indicate any recent Medical Services you may have received from other than [facility] providers in the past year (date may be approximate).     Assessment:   This is a routine wellness examination for Joanna Morris.  Hearing/Vision screen Hearing Screening - Comments:: NO AIDS Vision Screening - Comments:: NO GLASSES-[facility]   Goals Addressed             This Visit's Progress    Cut out extra servings         Depression Screen    04/27/2023    8:18 AM 04/21/2022    8:25 AM 10/03/2021    9:46 AM 06/28/2021    8:35 AM 04/15/2021    8:27 AM 12/21/2020    8:27 AM 03/23/2020    8:27 AM  PHQ 2/9 Scores  PHQ - 2 Score 0 0 0 0 0 0 0  PHQ- 9 Score 0 0 0 0  0     Fall Risk    04/27/2023    8:20 AM 04/21/2022    8:27 AM 10/03/2021    9:46 AM 06/28/2021    8:25 AM 04/15/2021    8:30 AM  Fall Risk   Falls in the past year? 0 1 1 0 0  Number falls in past yr: 0 0 0 0 0  Injury with Fall? 0 0 0 0 0  Risk for fall due to : No Fall Risks History of fall(s) History of fall(s) No Fall Risks No Fall Risks  Follow up Falls prevention discussed;Falls evaluation completed Falls evaluation completed;Falls prevention discussed Falls evaluation completed Falls evaluation completed Falls evaluation completed    MEDICARE RISK AT HOME: Medicare Risk at Home Any stairs in or around the home?: No If so, are there any without handrails?: No Home free of loose throw rugs in walkways, pet beds, electrical  cords, etc?: Yes Adequate lighting in your home to reduce risk of falls?: Yes Life alert?: No Use of a cane, walker or w/c?: No Grab bars in the bathroom?: Yes Shower chair or bench in shower?: No Elevated toilet seat or a handicapped toilet?: No  TIMED UP AND GO:  Was the test performed?  No    Cognitive Function:        04/27/2023    8:22 AM 04/21/2022    8:35 AM  03/23/2020    8:28 AM 03/18/2019    8:44 AM 03/13/2018    8:36 AM  6CIT Screen  What Year? 0 points 0 points 0 points 0 points 0 points  What month? 0 points 0 points 0 points 0 points 0 points  What time? 0 points 0 points 0 points 0 points 0 points  Count back from 20 0 points 2 points 0 points 0 points 0 points  Months in reverse 0 points 0 points 0 points 0 points 0 points  Repeat phrase 2 points 4 points 0 points 0 points 2 points  Total Score 2 points 6 points 0 points 0 points 2 points    Immunizations Immunization History  Administered Date(s) Administered   Fluad Quad(high Dose 65+) 01/17/2019, 02/10/2020, 12/21/2020   Influenza, High Dose Seasonal PF 12/27/2016, 01/16/2018   Influenza-Unspecified 01/17/2023   PFIZER(Purple Top)SARS-COV-2 Vaccination 05/01/2019, 05/27/2019, 01/03/2020   Zoster, Unspecified 01/17/2023    TDAP status: Due, Education has been provided regarding the importance of this vaccine. Advised may receive this vaccine at local pharmacy or Health Dept. Aware to provide a copy of the vaccination record if obtained from local pharmacy or Health Dept. Verbalized acceptance and understanding.  Flu Vaccine status: Up to date  Pneumococcal vaccine status: Declined,  Education has been provided regarding the importance of this vaccine but patient still declined. Advised may receive this vaccine at local pharmacy or Health Dept. Aware to provide a copy of the vaccination record if obtained from local pharmacy or Health Dept. Verbalized acceptance and understanding.   Covid-19 vaccine status: Completed vaccines  Qualifies for Shingles Vaccine? Yes   Zostavax completed Yes   Shingrix Completed?: No.    Education has been provided regarding the importance of this vaccine. Patient has been advised to call insurance company to determine out of pocket expense if they have not yet received this vaccine. Advised may also receive vaccine at local pharmacy or Health Dept.  Verbalized acceptance and understanding.  Screening Tests Health Maintenance  Topic Date Due   Pneumonia Vaccine 60+ Years old (1 of 2 - PCV) Never done   DTaP/Tdap/Td (1 - Tdap) Never done   Zoster Vaccines- Shingrix (1 of 2) 05/17/1967   COLON CANCER SCREENING ANNUAL FOBT  12/21/2019   COVID-19 Vaccine (4 - 2024-25 season) 11/05/2022   MAMMOGRAM  03/13/2024   Medicare Annual Wellness (AWV)  04/26/2024   Colonoscopy  12/09/2025   INFLUENZA VACCINE  Completed   DEXA SCAN  Completed   Hepatitis C Screening  Completed   HPV VACCINES  Aged Out    Health Maintenance  Health Maintenance Due  Topic Date Due   Pneumonia Vaccine 29+ Years old (1 of 2 - PCV) Never done   DTaP/Tdap/Td (1 - Tdap) Never done   Zoster Vaccines- Shingrix (1 of 2) 05/17/1967   COLON CANCER SCREENING ANNUAL FOBT  12/21/2019   COVID-19 Vaccine (4 - 2024-25 season) 11/05/2022    Colorectal cancer screening: Type of screening: Colonoscopy. Completed 12/10/15.  Repeat every 10 years  Mammogram status: Ordered 04/27/23. Pt provided with contact info and advised to call to schedule appt.   Bone Density status: Completed 01/08/17. Results reflect: Bone density results: OSTEOPENIA. Repeat every 5 years.  Lung Cancer Screening: (Low Dose CT Chest recommended if Age 29-80 years, 20 pack-year currently smoking OR have quit w/in 15years.) does qualify.   Lung Cancer Screening Referral: HAS CT SCAN APPOINTMENT 07/09/23  Additional Screening:  Hepatitis C Screening: does qualify; Completed 12/21/20  Vision Screening: Recommended annual ophthalmology exams for early detection of glaucoma and other disorders of the eye. Is the patient up to date with their annual eye exam?  Yes  Who is the provider or what is the name of the office in which the patient attends annual eye exams? [facility] If pt is not established with a provider, would they like to be referred to a provider to establish care? No .   Dental Screening:  Recommended annual dental exams for proper oral hygiene   Community Resource Referral / Chronic Care Management: CRR required this visit?  No   CCM required this visit?  No     Plan:     I have personally reviewed and noted the following in the patient's chart:   Medical and social history Use of alcohol, tobacco or illicit drugs  Current medications and supplements including opioid prescriptions. Patient is not currently taking opioid prescriptions. Functional ability and status Nutritional status Physical activity Advanced directives List of other physicians Hospitalizations, surgeries, and ER visits in previous 12 months Vitals Screenings to include cognitive, depression, and falls Referrals and appointments  In addition, I have reviewed and discussed with patient certain preventive protocols, quality metrics, and best practice recommendations. A written personalized care plan for preventive services as well as general preventive health recommendations were provided to patient.     Hal Hope, LPN   [id number]   After Visit Summary: (MyChart) Due to this being a telephonic visit, the after visit summary with patients personalized plan was offered to patient via MyChart   Nurse Notes: REFERRALS SENT FOR MAMMOGRAM AND BDS

## 2023-04-27 NOTE — Patient Instructions (Addendum)
Joanna Morris , Thank you for taking time to come for your Medicare Wellness Visit. I appreciate your ongoing commitment to your health goals. Please review the following plan we discussed and let me know if I can assist you in the future.   Referrals/Orders/Follow-Ups/Clinician Recommendations: REFERRALS SENT FOR MAMMOGRAM AND BONE DENSITY SCAN  You have an order for:  []   2D Mammogram  [x]   3D Mammogram  [x]   Bone Density     Please call for appointment:  [facility]  [address] [place] [address] [phone] [facility] [address] [place], [place] [address] [phone] [facility] [address] [place], [place] [address] [phone]   Make sure to wear two-piece clothing.  No lotions, powders, or deodorants the day of the appointment. Make sure to bring picture ID and insurance card.  Bring list of medications you are currently taking including any supplements.   Schedule your [place] screening mammogram through MyChart!   Log into your MyChart account.  Go to 'Visit' (or 'Appointments' if on mobile App) --> Schedule an Appointment  Under 'Select a Reason for Visit' choose the Mammogram Screening option.  Complete the pre-visit questions and select the time and place that best fits your schedule.   This is a list of the screening recommended for you and due dates:  Health Maintenance  Topic Date Due   Pneumonia Vaccine (1 of 2 - PCV) Never done   DTaP/Tdap/Td vaccine (1 - Tdap) Never done   Zoster (Shingles) Vaccine (1 of 2) 05/17/1967   Stool Blood Test  12/21/2019   COVID-19 Vaccine (4 - 2024-25 season) 11/05/2022   Mammogram  03/13/2024   Medicare Annual Wellness Visit  04/26/2024   Colon Cancer Screening  12/09/2025   Flu Shot  Completed   DEXA scan (bone density measurement)  Completed   Hepatitis C Screening   Completed   HPV Vaccine  Aged Out    Advanced directives: (ACP Link)Information on Advanced Care Planning can be found at [facility] [place] Advance Health Care Directives Advance Health Care Directives ([email or web address]   Next Medicare Annual Wellness Visit scheduled for next year: Yes   05/02/24 @ 8:10 AM BY PHONE

## 2023-05-07 ENCOUNTER — Ambulatory Visit (INDEPENDENT_AMBULATORY_CARE_PROVIDER_SITE_OTHER): Payer: 59 | Admitting: Family Medicine

## 2023-05-07 ENCOUNTER — Encounter: Payer: Self-pay | Admitting: Family Medicine

## 2023-05-07 VITALS — BP 144/84 | HR 85 | Ht 61.0 in | Wt 172.0 lb

## 2023-05-07 DIAGNOSIS — I10 Essential (primary) hypertension: Secondary | ICD-10-CM | POA: Diagnosis not present

## 2023-05-07 DIAGNOSIS — R7309 Other abnormal glucose: Secondary | ICD-10-CM

## 2023-05-07 DIAGNOSIS — Z8739 Personal history of other diseases of the musculoskeletal system and connective tissue: Secondary | ICD-10-CM | POA: Diagnosis not present

## 2023-05-07 DIAGNOSIS — E7800 Pure hypercholesterolemia, unspecified: Secondary | ICD-10-CM | POA: Diagnosis not present

## 2023-05-07 DIAGNOSIS — Z1211 Encounter for screening for malignant neoplasm of colon: Secondary | ICD-10-CM | POA: Diagnosis not present

## 2023-05-07 NOTE — Progress Notes (Signed)
 Subjective:    Patient ID: Joanna Morris, female    DOB: 1950/07/28, 74 y.o.   MRN: [id number]  Joanna Morris is a 73 y.o. female presenting on 05/07/2023 for Gout and Hypertension   HPI  Discussed the use of AI scribe software for clinical note transcription with the patient, who gave verbal consent to proceed.  History of Present Illness    Joanna Morris is a 73 year old female with gout and hypertension who presents for a follow-up visit.  She has been managing her recent new acute gout flare with prednisone, which effectively resolved her symptoms after approximately three doses. She has additional prednisone available for any future flares. Due for Uric Acid level on labs today  Regarding her hypertension, she did not take her blood pressure medication today due to running late. She owns a home blood pressure cuff but has not recently checked her blood pressure at home. Her last recorded blood pressure was 142/90, and she acknowledges that her blood pressure might be elevated today due to missing her medication.  She has not had any recent blood work since 2023 and is scheduled to have a comprehensive panel today, including uric acid, chemistry, cholesterol, blood counts, and sugar levels.           05/07/2023    9:38 AM 04/27/2023    8:18 AM 04/21/2022    8:25 AM  Depression screen PHQ 2/9  Decreased Interest 0 0 0  Down, Depressed, Hopeless 0 0 0  PHQ - 2 Score 0 0 0  Altered sleeping 0 0 0  Tired, decreased energy 0 0 0  Change in appetite 0 0 0  Feeling bad or failure about yourself  0 0 0  Trouble concentrating 0 0 0  Moving slowly or fidgety/restless 0 0 0  Suicidal thoughts 0 0 0  PHQ-9 Score 0 0 0  Difficult doing work/chores Not difficult at all Not difficult at all Not difficult at all       05/07/2023    9:39 AM 10/03/2021    9:46 AM 06/28/2021    8:35 AM 12/21/2020    8:27 AM  GAD 7 : Generalized Anxiety Score  Nervous, Anxious, on Edge 0 0 0 0   Control/stop worrying 0 0 0 0  Worry too much - different things 0 0 0 0  Trouble relaxing 0 0 0 0  Restless 0 0 0 0  Easily annoyed or irritable 0 0 0 0  Afraid - awful might happen 0 0 0 0  Total GAD 7 Score 0 0 0 0  Anxiety Difficulty Not difficult at all Not difficult at all Not difficult at all Not difficult at all    Social History   Tobacco Use   Smoking status: Every Day    Current packs/day: 1.00    Average packs/day: 1 pack/day for 51.0 years (51.0 ttl pk-yrs)    Types: Cigarettes   Smokeless tobacco: Never   Tobacco comments:    Quit once for 2 years. pt using nicorette gum     0.5 PPD 12/13/2021  Vaping Use   Vaping status: Never Used  Substance Use Topics   Alcohol use: Not Currently    Alcohol/week: 1.0 - 3.0 standard drink of alcohol    Types: 1 - 3 Standard drinks or equivalent per week    Comment: half a pint of liquor every week   Drug use: No    Review of Systems Per  HPI unless specifically indicated above     Objective:    BP (!) 144/84 (BP Location: Left Arm, Cuff Size: Normal)   Pulse 85   Ht 5\' 1"  (1.549 m)   Wt 172 lb (78 kg)   SpO2 94%   BMI 32.50 kg/m   Wt Readings from Last 3 Encounters:  05/07/23 172 lb (78 kg)  03/30/23 170 lb (77.1 kg)  01/11/23 173 lb (78.5 kg)    Physical Exam Vitals and nursing note reviewed.  Constitutional:      General: She is not in acute distress.    Appearance: Normal appearance. She is well-developed. She is obese. She is not diaphoretic.     Comments: Well-appearing, comfortable, cooperative  HENT:     Head: Normocephalic and atraumatic.  Eyes:     General:        Right eye: No discharge.        Left eye: No discharge.     Conjunctiva/sclera: Conjunctivae normal.  Cardiovascular:     Rate and Rhythm: Normal rate.  Pulmonary:     Effort: Pulmonary effort is normal.  Musculoskeletal:     Right lower leg: No edema.     Left lower leg: No edema.  Skin:    General: Skin is warm and dry.      Findings: No erythema or rash.  Neurological:     Mental Status: She is alert and oriented to person, place, and time.  Psychiatric:        Mood and Affect: Mood normal.        Behavior: Behavior normal.        Thought Content: Thought content normal.     Comments: Well groomed, good eye contact, normal speech and thoughts     Results for orders placed or performed in visit on 01/31/22  Rad Onc Aria Session Summary   Collection Time: 01/31/22 10:29 AM  Result Value Ref Range   Course ID C1_Chest    Course Intent Curative    Course Start Date 12/26/2021 10:17 AM    Session Number 10    Course First Treatment Date 01/16/2022 10:20 AM    Course Last Treatment Date 01/31/2022 10:28 AM    Course Elapsed Days 15    Reference Point ID Lt Lung DP    Reference Point Dosage Given to Date 50 Gy   Reference Point Session Dosage Given 5 Gy   Plan ID Lung_L_UHRT    Plan Fractions Treated to Date 10    Plan Total Fractions Prescribed 10    Plan Prescribed Dose Per Fraction 5 Gy   Plan Total Prescribed Dose 50.000000 Gy   Plan Primary Reference Point Lt Lung DP       Assessment & Plan:   Problem List Items Addressed This Visit     Elevated hemoglobin A1c   Relevant Orders   Hemoglobin A1c   Elevated LDL cholesterol level   Relevant Orders   TSH   Lipid panel   Essential hypertension - Primary   Relevant Orders   COMPLETE METABOLIC PANEL WITH GFR   CBC with Differential/Platelet   Other Visit Diagnoses       History of gout       Relevant Orders   Uric acid     Screening for colon cancer       Relevant Orders   Cologuard        History of Gout Resolved with prednisone. No current flare. Uric acid level to  be checked today -Continue current management plan. -Keep remaining prednisone for potential future flares.  Hypertension Elevated blood pressure readings in office. Patient did not take antihypertensive medication prior to visit. -Encourage patient to monitor  blood pressure at home and record readings. -If readings consistently >140/90, patient to contact office for potential medication adjustment.  Colon Cancer Screening Last screening in 2023 or 2024 FOBT. Patient agrees to Cologuard home test. -Order Cologuard home test.  General Health Maintenance -Comprehensive lab work to be done today, including cholesterol, blood counts, chemistry, sugar, and uric acid levels. -Schedule follow-up appointment in six months (September 2025).       Orders Placed This Encounter  Procedures   TSH   Lipid panel    Has the patient fasted?:   Yes   Hemoglobin A1c   COMPLETE METABOLIC PANEL WITH GFR   CBC with Differential/Platelet   Uric acid   Cologuard    No orders of the defined types were placed in this encounter.   Follow up plan: Return in about 6 months (around 11/07/2023) for 6 month PreDM A1c, HTN.  Saralyn Pilar, DO [facility] 05/07/2023, 9:07 AM

## 2023-05-07 NOTE — Patient Instructions (Addendum)
 Thank you for coming to the office today.  Goal is to keep track of BP at home after you take medications for the day, goal < 140 / 90 If consistently elevated, please call us back and we can adjust medication  If need re orders let me know.  Next time we can check sugar and BP  Labs today  Colon Cancer Screening:  Ordered the Cologuard (home kit) test for colon cancer screening. Stay tuned for further updates.  It will be shipped to you directly. If not received in 2-4 weeks, call us or the company.   If you send it back and no results are received in 2-4 weeks, call us or the company as well!   Colon Cancer Screening: - For all adults age 22+ routine colon cancer screening is highly recommended.     - Recent guidelines from American Cancer Society recommend starting age of 24 - Early detection of colon cancer is important, because often there are no warning signs or symptoms, also if found early usually it can be cured. Late stage is hard to treat.   - If Cologuard is NEGATIVE, then it is good for 3 years before next due - If Cologuard is POSITIVE, then it is strongly advised to get a Colonoscopy, which allows the GI doctor to locate the source of the cancer or polyp (even very early stage) and treat it by removing it. ------------------------- Follow instructions to collect sample, you may call the company for any help or questions, 24/7 telephone support at [phone].   Please schedule a Follow-up Appointment to: Return in about 6 months (around 11/07/2023) for 6 month PreDM A1c, HTN.  If you have any other questions or concerns, please feel free to call the office or send a message through MyChart. You may also schedule an earlier appointment if necessary.  Additionally, you may be receiving a survey about your experience at our office within a few days to 1 week by e-mail or mail. We value your feedback.  Saralyn Pilar, DO [facility], [place]

## 2023-05-08 DIAGNOSIS — M1711 Unilateral primary osteoarthritis, right knee: Secondary | ICD-10-CM | POA: Diagnosis not present

## 2023-05-08 LAB — CBC WITH DIFFERENTIAL/PLATELET
Absolute Lymphocytes: 1434 {cells}/uL (ref 850–3900)
Absolute Monocytes: 333 {cells}/uL (ref 200–950)
Basophils Absolute: 19 {cells}/uL (ref 0–200)
Basophils Relative: 0.6 %
Eosinophils Absolute: 29 {cells}/uL (ref 15–500)
Eosinophils Relative: 0.9 %
HCT: 48 % — ABNORMAL HIGH (ref 35.0–45.0)
Hemoglobin: 15.8 g/dL — ABNORMAL HIGH (ref 11.7–15.5)
MCH: 30.3 pg (ref 27.0–33.0)
MCHC: 32.9 g/dL (ref 32.0–36.0)
MCV: 92 fL (ref 80.0–100.0)
MPV: 12.1 fL (ref 7.5–12.5)
Monocytes Relative: 10.4 %
Neutro Abs: 1386 {cells}/uL — ABNORMAL LOW (ref 1500–7800)
Neutrophils Relative %: 43.3 %
Platelets: 128 10*3/uL — ABNORMAL LOW (ref 140–400)
RBC: 5.22 10*6/uL — ABNORMAL HIGH (ref 3.80–5.10)
RDW: 12.3 % (ref 11.0–15.0)
Total Lymphocyte: 44.8 %
WBC: 3.2 10*3/uL — ABNORMAL LOW (ref 3.8–10.8)

## 2023-05-08 LAB — HEMOGLOBIN A1C
Hgb A1c MFr Bld: 5.7 %{Hb} — ABNORMAL HIGH (ref <5.7)
Mean Plasma Glucose: 117 mg/dL
eAG (mmol/L): 6.5 mmol/L

## 2023-05-08 LAB — COMPLETE METABOLIC PANEL WITH GFR
AG Ratio: 1.2 (calc) (ref 1.0–2.5)
ALT: 115 U/L — ABNORMAL HIGH (ref 6–29)
AST: 127 U/L — ABNORMAL HIGH (ref 10–35)
Albumin: 3.8 g/dL (ref 3.6–5.1)
Alkaline phosphatase (APISO): 79 U/L (ref 37–153)
BUN: 11 mg/dL (ref 7–25)
CO2: 35 mmol/L — ABNORMAL HIGH (ref 20–32)
Calcium: 9 mg/dL (ref 8.6–10.4)
Chloride: 100 mmol/L (ref 98–110)
Creat: 0.65 mg/dL (ref 0.60–1.00)
Globulin: 3.3 g/dL (ref 1.9–3.7)
Glucose, Bld: 93 mg/dL (ref 65–99)
Potassium: 3.7 mmol/L (ref 3.5–5.3)
Sodium: 143 mmol/L (ref 135–146)
Total Bilirubin: 0.6 mg/dL (ref 0.2–1.2)
Total Protein: 7.1 g/dL (ref 6.1–8.1)
eGFR: 92 mL/min/{1.73_m2} (ref >=60)

## 2023-05-08 LAB — LIPID PANEL
Cholesterol: 147 mg/dL (ref <200)
HDL: 56 mg/dL (ref >=50)
LDL Cholesterol (Calc): 74 mg/dL
Non-HDL Cholesterol (Calc): 91 mg/dL (ref <130)
Total CHOL/HDL Ratio: 2.6 (calc) (ref <5.0)
Triglycerides: 89 mg/dL (ref <150)

## 2023-05-08 LAB — URIC ACID: Uric Acid, Serum: 6 mg/dL (ref 2.5–7.0)

## 2023-05-08 LAB — TSH: TSH: 1.97 m[IU]/L (ref 0.40–4.50)

## 2023-07-06 ENCOUNTER — Other Ambulatory Visit: Payer: Self-pay | Admitting: Family Medicine

## 2023-07-06 DIAGNOSIS — M5441 Lumbago with sciatica, right side: Secondary | ICD-10-CM

## 2023-07-06 NOTE — Telephone Encounter (Signed)
 Copied from CRM [id number]. Topic: Clinical - Medication Refill >> Jul 06, 2023  2:37 PM Elle L wrote: Most Recent Primary Care Visit:  Provider: Raina Bunting  Department: [facility]  Visit Type: OFFICE VISIT  Date: 05/07/2023  Medication: naproxen  (NAPROSYN ) 500 MG tablet  Has the patient contacted their pharmacy? Yes  Is this the correct pharmacy for this prescription? Yes  This is the patient's preferred pharmacy:  [facility] [address]), [place] - [address] [place]) [place] [address] Phone: [phone] Fax: [phone]   Has the prescription been filled recently? No  Is the patient out of the medication? Yes  Has the patient been seen for an appointment in the last year OR does the patient have an upcoming appointment? Yes  Can we respond through MyChart? No  Agent: Please be advised that Rx refills may take up to 3 business days. We ask that you follow-up with your pharmacy.

## 2023-07-10 MED ORDER — NAPROXEN 500 MG PO TABS: 500.0000 mg | ORAL_TABLET | Freq: Two times a day (BID) | ORAL | 2 refills | Status: DC

## 2023-07-10 NOTE — Telephone Encounter (Signed)
 Requested Prescriptions  Pending Prescriptions Disp Refills   naproxen  (NAPROSYN ) 500 MG tablet 60 tablet 2    Sig: Take 1 tablet (500 mg total) by mouth 2 (two) times daily with a meal. For 1-2 weeks then as needed     Analgesics:  NSAIDS Failed - 07/10/2023  8:11 AM      Failed - Manual Review: Labs are only required if the patient has taken medication for more than 8 weeks.      Failed - HGB in normal range and within 360 days    Hemoglobin  Date Value Ref Range Status  05/07/2023 15.8 (H) 11.7 - 15.5 g/dL Final         Failed - PLT in normal range and within 360 days    Platelets  Date Value Ref Range Status  05/07/2023 128 (L) 140 - 400 Thousand/uL Final         Failed - HCT in normal range and within 360 days    HCT  Date Value Ref Range Status  05/07/2023 48.0 (H) 35.0 - 45.0 % Final         Passed - Cr in normal range and within 360 days    Creat  Date Value Ref Range Status  05/07/2023 0.65 0.60 - 1.00 mg/dL Final         Passed - eGFR is 30 or above and within 360 days    GFR, Est African American  Date Value Ref Range Status  01/13/2019 83 > OR = 60 mL/min/1.35m2 Final   GFR, Est Non African American  Date Value Ref Range Status  01/13/2019 71 > OR = 60 mL/min/1.60m2 Final   GFR, Estimated  Date Value Ref Range Status  09/21/2021 >60 >60 mL/min Final    Comment:    (NOTE) Calculated using the CKD-EPI Creatinine Equation (2021)    eGFR  Date Value Ref Range Status  05/07/2023 92 > OR = 60 mL/min/1.56m2 Final         Passed - Patient is not pregnant      Passed - Valid encounter within last 12 months    Recent Outpatient Visits           2 months ago Essential hypertension   [place] [facility], Kay[place], [place]

## 2023-07-11 ENCOUNTER — Ambulatory Visit
Admission: RE | Admit: 2023-07-11 | Discharge: 2023-07-11 | Disposition: A | Discharge status: Home / Self Care | Payer: 59 | Source: Ambulatory Visit | Attending: Radiation Oncology | Admitting: Radiation Oncology

## 2023-07-11 DIAGNOSIS — C3491 Malignant neoplasm of unspecified part of right bronchus or lung: Secondary | ICD-10-CM | POA: Diagnosis not present

## 2023-07-11 DIAGNOSIS — J929 Pleural plaque without asbestos: Secondary | ICD-10-CM | POA: Diagnosis not present

## 2023-07-11 DIAGNOSIS — R918 Other nonspecific abnormal finding of lung field: Secondary | ICD-10-CM | POA: Diagnosis not present

## 2023-07-11 DIAGNOSIS — E041 Nontoxic single thyroid nodule: Secondary | ICD-10-CM | POA: Diagnosis not present

## 2023-07-11 MED ORDER — IOHEXOL 300 MG/ML  SOLN
75.0000 mL | Freq: Once | INTRAMUSCULAR | Status: AC | PRN
  Administered 2023-07-11: 75 mL via INTRAVENOUS

## 2023-07-19 ENCOUNTER — Ambulatory Visit
Admission: RE | Admit: 2023-07-19 | Discharge: 2023-07-19 | Disposition: A | Discharge status: Home / Self Care | Payer: 59 | Source: Ambulatory Visit | Attending: Radiation Oncology | Admitting: Radiation Oncology

## 2023-07-19 ENCOUNTER — Encounter: Payer: Self-pay | Admitting: Radiation Oncology

## 2023-07-19 VITALS — BP 177/101 | HR 67 | Temp 97.0°F | Resp 16 | Ht 61.0 in | Wt 174.2 lb

## 2023-07-19 DIAGNOSIS — C3491 Malignant neoplasm of unspecified part of right bronchus or lung: Secondary | ICD-10-CM

## 2023-07-19 DIAGNOSIS — C3412 Malignant neoplasm of upper lobe, left bronchus or lung: Secondary | ICD-10-CM | POA: Diagnosis not present

## 2023-07-19 DIAGNOSIS — Z85118 Personal history of other malignant neoplasm of bronchus and lung: Secondary | ICD-10-CM | POA: Diagnosis not present

## 2023-07-19 DIAGNOSIS — Z923 Personal history of irradiation: Secondary | ICD-10-CM | POA: Diagnosis not present

## 2023-07-19 DIAGNOSIS — F17210 Nicotine dependence, cigarettes, uncomplicated: Secondary | ICD-10-CM | POA: Diagnosis not present

## 2023-07-19 NOTE — Progress Notes (Signed)
 Radiation Oncology Follow up Note  Name: Joanna Morris   Date:   07/19/2023 MRN:  [id number] DOB: February 18, 1951    This 73 y.o. 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  REFERRING PROVIDER: Domingo Friend *  HPI: Is a 73 year old female now out 16 months having completed ultra hypofractionated radiation therapy for stage I non-small cell lung cancer left upper lobe.  Seen today in routine follow-up she is doing well somewhat fatigued.  Specifically denies cough hemoptysis or chest tightness..  She had a recent CT scan of her chest showing improving opacity in the lingula with some residual thickening consistent with radiation changes.  No developing new masses or nodal enlargement.  She is status post right upper lobectomy.  COMPLICATIONS OF TREATMENT: none  FOLLOW UP COMPLIANCE: keeps appointments   PHYSICAL EXAM:  BP (!) 177/101 Comment: has not taken BP meds today  Pulse 67   Temp (!) 97 F (36.1 C)   Resp 16   Ht 5\' 1"  (1.549 m)   Wt 174 lb 3.2 oz (79 kg)   BMI 32.91 kg/m  Well-developed well-nourished patient in NAD. HEENT reveals PERLA, EOMI, discs not visualized.  Oral cavity is clear. No oral mucosal lesions are identified. Neck is clear without evidence of cervical or supraclavicular adenopathy. Lungs are clear to A&P. Cardiac examination is essentially unremarkable with regular rate and rhythm without murmur rub or thrill. Abdomen is benign with no organomegaly or masses noted. Motor sensory and DTR levels are equal and symmetric in the upper and lower extremities. Cranial nerves II through XII are grossly intact. Proprioception is intact. No peripheral adenopathy or edema is identified. No motor or sensory levels are noted. Crude visual fields are within normal range.  RADIOLOGY RESULTS: Scans reviewed compatible with above-stated  findings  PLAN: Present time patient is doing well no evidence of disease now out 16 months since for radiation therapy.  On pleased with her overall progress.  I have asked to see her back in 1 year for follow-up with a repeat CT scan at that time.  Patient knows to call with any concerns.  I would like to take this opportunity to thank you for allowing me to participate in the care of your patient.Glenis Langdon, MD

## 2023-08-02 ENCOUNTER — Other Ambulatory Visit: Payer: Self-pay | Admitting: Family Medicine

## 2023-08-02 ENCOUNTER — Telehealth: Payer: Self-pay | Admitting: Family Medicine

## 2023-08-02 ENCOUNTER — Ambulatory Visit: Payer: Self-pay

## 2023-08-02 DIAGNOSIS — M10471 Other secondary gout, right ankle and foot: Secondary | ICD-10-CM

## 2023-08-02 MED ORDER — PREDNISONE 20 MG PO TABS: ORAL_TABLET | ORAL | 0 refills | Status: DC

## 2023-08-02 NOTE — Telephone Encounter (Signed)
 Patient notified of all instructions with verbal understanding

## 2023-08-02 NOTE — Telephone Encounter (Signed)
 Copied from CRM [id number]. Topic: Clinical - Medication Refill >> Aug 02, 2023 10:29 AM Phil Braun wrote: Medication: predniSONE  (DELTASONE ) 20 MG tablet   Has the patient contacted their pharmacy? No   This is the patient's preferred pharmacy:  [facility] [address] ([address]), [place] - [address] [place]) [place] [address] Phone: [phone] Fax: [phone]  Is this the correct pharmacy for this prescription? Yes If no, delete pharmacy and type the correct one.   Has the prescription been filled recently? Yes  Is the patient out of the medication? Yes  Has the patient been seen for an appointment in the last year OR does the patient have an upcoming appointment? Yes  Can we respond through MyChart? No  Agent: Please be advised that Rx refills may take up to 3 business days. We ask that you follow-up with your pharmacy.

## 2023-08-02 NOTE — Telephone Encounter (Signed)
 Pt requested refill of prednisone  which is not on her current med list. See Nurse Triage encounter.

## 2023-08-02 NOTE — Telephone Encounter (Signed)
 Please call patient to notify her that I have agreed to send in Prednisone  taper 7 day course now to her [facility].  She should hold off on Naproxen   / ibuprofen  while she is on prednisone . I usually advise that it is important to treat gout flares ASAP, so I will order this now, if it is not resolving or she has any other concerns at all she can schedule follow-up within next 7-10 days for this problem.  Domingo Friend, DO [facility] 08/02/2023, 1:59 PM

## 2023-08-02 NOTE — Telephone Encounter (Signed)
 Chief Complaint: gout flare Symptoms: R foot pain, redness, swelling Frequency: since yesterday Pertinent Negatives: Patient denies fever, chills, SOB, CP, leg swelling, calf swelling/pain Disposition: [] ED /[] Urgent Care (no appt availability in office) / [x] Appointment(In office/virtual)/ []  [facility]/ [] Home Care/ [x] Refused Recommended Disposition /[] [facility]/ []  Follow-up with PCP Additional Notes: Pt requested a refill of prednisone  which is not on her current med list. Pt was prescribed prednisone  in January for gout. RN called the pt. Pt reports gout flare since yesterday to her R foot with 9/10 pain. Pt reports walking is difficult and states wearing a shoe is painful. Pt reports she has been eating red meat lately. Pt endorses some redness and swelling but none to her calf or leg. Pt denies fever. RN offered pt a same-day appt but pt stated she is unable to come into the office because she can't drive due to the pain. RN advised pt RN would relay symptoms to the office for follow-up. Pt would appreciate a refill of prednisone . RN advised the pt if she develops worsening she needs to go to the ED. Pt verbalized understanding.    Reason for Disposition  [1] SEVERE pain (e.g., excruciating, unable to do any normal activities) AND [2] not improved after 2 hours of pain medicine  Answer Assessment - Initial Assessment Questions 1. ONSET: "When did the pain start?"      Yesterday  2. LOCATION: "Where is the pain located?"      R foot  3. PAIN: "How bad is the pain?"    (Scale 1-10; or mild, moderate, severe)  - MILD (1-3): doesn't interfere with normal activities.   - MODERATE (4-7): interferes with normal activities (e.g., work or school) or awakens from sleep, limping.   - SEVERE (8-10): excruciating pain, unable to do any normal activities, unable to walk.      9/10 - constant; gabapentin  is not effective 4. WORK OR EXERCISE: "Has there been any recent work  or exercise that involved this part of the body?"      No - states she is eating a lot of red meat 5. CAUSE: "What do you think is causing the foot pain?"     gout 6. OTHER SYMPTOMS: "Do you have any other symptoms?" (e.g., leg pain, rash, fever, numbness)     Difficulty walking, pain with wearing a shoe. Endorses a little bit of swelling to the R foot, little bit of redness  Denies calf or leg swelling. Denies fever or chills  Taken pred in the past for gout  Protocols used: Foot Pain-A-AH

## 2023-08-06 ENCOUNTER — Other Ambulatory Visit: Payer: Self-pay | Admitting: Family Medicine

## 2023-08-06 DIAGNOSIS — E7800 Pure hypercholesterolemia, unspecified: Secondary | ICD-10-CM

## 2023-08-06 DIAGNOSIS — I10 Essential (primary) hypertension: Secondary | ICD-10-CM

## 2023-08-07 NOTE — Telephone Encounter (Signed)
 Requested Prescriptions  Pending Prescriptions Disp Refills   rosuvastatin  (CRESTOR ) 5 MG tablet [Pharmacy Med Name: Rosuvastatin  Calcium  5 MG Oral Tablet] 90 tablet 0    Sig: TAKE 1 TABLET BY MOUTH DAILY     Cardiovascular:  Antilipid - Statins 2 Failed - 08/07/2023  4:25 PM      Failed - Lipid Panel in normal range within the last 12 months    Cholesterol  Date Value Ref Range Status  05/07/2023 147 <200 mg/dL Final   LDL Cholesterol (Calc)  Date Value Ref Range Status  05/07/2023 74 mg/dL (calc) Final    Comment:    Reference range: <100 . Desirable range <100 mg/dL for primary prevention;   <70 mg/dL for patients with CHD or diabetic patients  with > or = 2 CHD risk factors. Aaron Aas LDL-C is now calculated using the Martin-Hopkins  calculation, which is a validated novel method providing  better accuracy than the Friedewald equation in the  estimation of LDL-C.  Melinda Sprawls et al. Erroll Heard. 4098;119(14): 2061-2068  (http://education.[employer].com/faq/FAQ164)    HDL  Date Value Ref Range Status  05/07/2023 56 > OR = 50 mg/dL Final   Triglycerides  Date Value Ref Range Status  05/07/2023 89 <150 mg/dL Final         Passed - Cr in normal range and within 360 days    Creat  Date Value Ref Range Status  05/07/2023 0.65 0.60 - 1.00 mg/dL Final         Passed - Patient is not pregnant      Passed - Valid encounter within last 12 months    Recent Outpatient Visits           3 months ago Essential hypertension   [facility] [place], Kayleen Party, DO               valsartan  (DIOVAN ) 80 MG tablet [Pharmacy Med Name: Valsartan  80 MG Oral Tablet] 90 tablet 0    Sig: TAKE 1 TABLET BY MOUTH DAILY     Cardiovascular:  Angiotensin Receptor Blockers Failed - 08/07/2023  4:25 PM      Failed - Last BP in normal range    BP Readings from Last 1 Encounters:  07/19/23 (!) 177/101         Passed - Cr in normal range and within 180 days     Creat  Date Value Ref Range Status  05/07/2023 0.65 0.60 - 1.00 mg/dL Final         Passed - K in normal range and within 180 days    Potassium  Date Value Ref Range Status  05/07/2023 3.7 3.5 - 5.3 mmol/L Final         Passed - Patient is not pregnant      Passed - Valid encounter within last 6 months    Recent Outpatient Visits           3 months ago Essential hypertension   [place] [facility] [place], Kayleen Party, DO               amLODipine  (NORVASC ) 5 MG tablet [Pharmacy Med Name: amLODIPine  Besylate 5 MG Oral Tablet] 90 tablet 0    Sig: TAKE 1 TABLET BY MOUTH DAILY     Cardiovascular: Calcium  Channel Blockers 2 Failed - 08/07/2023  4:25 PM      Failed - Last BP in normal range    BP Readings from Last 1 Encounters:  07/19/23 (!) 177/101         Passed - Last Heart Rate in normal range    Pulse Readings from Last 1 Encounters:  07/19/23 67         Passed - Valid encounter within last 6 months    Recent Outpatient Visits           3 months ago Essential hypertension   [place] [facility] [place], Kayleen Party, [place]

## 2023-08-23 DIAGNOSIS — M1711 Unilateral primary osteoarthritis, right knee: Secondary | ICD-10-CM | POA: Diagnosis not present

## 2023-09-06 ENCOUNTER — Other Ambulatory Visit: Payer: Self-pay | Admitting: Family Medicine

## 2023-09-06 DIAGNOSIS — M5441 Lumbago with sciatica, right side: Secondary | ICD-10-CM

## 2023-09-10 NOTE — Telephone Encounter (Signed)
 Requested Prescriptions  Pending Prescriptions Disp Refills   gabapentin  (NEURONTIN ) 100 MG capsule [Pharmacy Med Name: Gabapentin  100 MG Oral Capsule] 90 capsule 1    Sig: TAKE 1 CAPSULE BY MOUTH AT  BEDTIME     Neurology: Anticonvulsants - gabapentin  Passed - 09/10/2023  1:33 PM      Passed - Cr in normal range and within 360 days    Creat  Date Value Ref Range Status  05/07/2023 0.65 0.60 - 1.00 mg/dL Final         Passed - Completed PHQ-2 or PHQ-9 in the last 360 days      Passed - Valid encounter within last 12 months    Recent Outpatient Visits           4 months ago Essential hypertension   [facility] [place], Marsa PARAS, [place]

## 2023-09-18 DIAGNOSIS — M1711 Unilateral primary osteoarthritis, right knee: Secondary | ICD-10-CM | POA: Diagnosis not present

## 2023-09-24 ENCOUNTER — Telehealth: Payer: Self-pay

## 2023-09-24 NOTE — Telephone Encounter (Signed)
 No further changes needed. I will review the report that they send us  as well and discuss with patient at next opportunity  Marsa Officer, DO [facility] 09/24/2023, 1:16 PM

## 2023-09-24 NOTE — Telephone Encounter (Signed)
 Copied from CRM [id number]. Topic: Clinical - Medical Advice >> Sep 24, 2023 11:47 AM Turkey B wrote: Reason for CRM: [place], np from [facility] called in just to report, pt, ,quantiflo , right foot mild, 0.72 left foot is mod 0.43

## 2023-09-25 DIAGNOSIS — M1711 Unilateral primary osteoarthritis, right knee: Secondary | ICD-10-CM | POA: Diagnosis not present

## 2023-10-02 DIAGNOSIS — M1711 Unilateral primary osteoarthritis, right knee: Secondary | ICD-10-CM | POA: Diagnosis not present

## 2023-10-27 ENCOUNTER — Other Ambulatory Visit: Payer: Self-pay | Admitting: Family Medicine

## 2023-10-27 DIAGNOSIS — I10 Essential (primary) hypertension: Secondary | ICD-10-CM

## 2023-10-29 NOTE — Telephone Encounter (Signed)
 Requested Prescriptions  Pending Prescriptions Disp Refills   amLODipine  (NORVASC ) 5 MG tablet [Pharmacy Med Name: amLODIPine  Besylate 5 MG Oral Tablet] 100 tablet 0    Sig: TAKE 1 TABLET BY MOUTH DAILY     Cardiovascular: Calcium  Channel Blockers 2 Failed - 10/29/2023  4:10 PM      Failed - Last BP in normal range    BP Readings from Last 1 Encounters:  07/19/23 (!) 177/101         Passed - Last Heart Rate in normal range    Pulse Readings from Last 1 Encounters:  07/19/23 67         Passed - Valid encounter within last 6 months    Recent Outpatient Visits           5 months ago Essential hypertension   [place] [facility] [place], Marsa PARAS, [place]               valsartan  (DIOVAN ) 80 MG tablet [Pharmacy Med Name: Valsartan  80 MG Oral Tablet] 100 tablet 0    Sig: TAKE 1 TABLET BY MOUTH DAILY     Cardiovascular:  Angiotensin Receptor Blockers Failed - 10/29/2023  4:10 PM      Failed - Last BP in normal range    BP Readings from Last 1 Encounters:  07/19/23 (!) 177/101         Passed - Cr in normal range and within 180 days    Creat  Date Value Ref Range Status  05/07/2023 0.65 0.60 - 1.00 mg/dL Final         Passed - K in normal range and within 180 days    Potassium  Date Value Ref Range Status  05/07/2023 3.7 3.5 - 5.3 mmol/L Final         Passed - Patient is not pregnant      Passed - Valid encounter within last 6 months    Recent Outpatient Visits           5 months ago Essential hypertension   [facility] [place], Marsa PARAS, [place]

## 2023-11-07 ENCOUNTER — Encounter: Payer: Self-pay | Admitting: Family Medicine

## 2023-11-07 ENCOUNTER — Telehealth: Payer: Self-pay

## 2023-11-07 ENCOUNTER — Ambulatory Visit (INDEPENDENT_AMBULATORY_CARE_PROVIDER_SITE_OTHER): Admitting: Family Medicine

## 2023-11-07 VITALS — BP 134/80 | HR 73 | Ht 61.0 in | Wt 175.4 lb

## 2023-11-07 DIAGNOSIS — R7309 Other abnormal glucose: Secondary | ICD-10-CM

## 2023-11-07 DIAGNOSIS — E7800 Pure hypercholesterolemia, unspecified: Secondary | ICD-10-CM | POA: Diagnosis not present

## 2023-11-07 DIAGNOSIS — E66811 Obesity, class 1: Secondary | ICD-10-CM | POA: Diagnosis not present

## 2023-11-07 DIAGNOSIS — I10 Essential (primary) hypertension: Secondary | ICD-10-CM

## 2023-11-07 LAB — POCT GLYCOSYLATED HEMOGLOBIN (HGB A1C): Hemoglobin A1C: 5.1 % (ref 4.0–5.6)

## 2023-11-07 MED ORDER — ROSUVASTATIN CALCIUM 5 MG PO TABS
5.0000 mg | ORAL_TABLET | Freq: Every day | ORAL | 1 refills | Status: AC
Start: 2023-11-07 — End: ?

## 2023-11-07 MED ORDER — VALSARTAN 80 MG PO TABS: 80.0000 mg | ORAL_TABLET | Freq: Every day | ORAL | 1 refills | Status: AC

## 2023-11-07 NOTE — Patient Instructions (Addendum)
 Thank you for coming to the office today.    Please schedule a Follow-up Appointment to: Return if symptoms worsen or fail to improve.  If you have any other questions or concerns, please feel free to call the office or send a message through MyChart. You may also schedule an earlier appointment if necessary.  Additionally, you may be receiving a survey about your experience at our office within a few days to 1 week by e-mail or mail. We value your feedback.  Marsa Officer, DO [facility], [place]

## 2023-11-07 NOTE — Progress Notes (Signed)
 Subjective:    Patient ID: Joanna Morris, female    DOB: 1950/10/31, 73 y.o.   MRN: [id number]  Joanna Morris is a 73 y.o. female presenting on 11/07/2023 for Medical Management of Chronic Issues   HPI  Discussed the use of AI scribe software for clinical note transcription with the patient, who gave verbal consent to proceed.  History of Present Illness   Joanna Morris is a 73 year old female with asthma who presents with difficulty breathing.  Hypertension - Monitors blood pressure at home with readings between 130 and 140 mmHg - Takes valsartan  80 mg for blood pressure control, received via home delivery [facility] Needs refills Physically active with walking  Pre-Diabetes Glycemic control - Recent hemoglobin A1c is 5.1, improved from a previous level of 5.7 - Implementing lifestyle changes including diet and increased physical activity to manage blood glucose  Hyperlipidemia Weight management - Current weight is 175 pounds, increased from a previous weight of 170 pounds - Maintains focus on weight management through an active lifestyle - Takes medication for cholesterol          11/07/2023    9:12 AM 05/07/2023    9:38 AM 04/27/2023    8:18 AM  Depression screen PHQ 2/9  Decreased Interest 0 0 0  Down, Depressed, Hopeless 0 0 0  PHQ - 2 Score 0 0 0  Altered sleeping  0 0  Tired, decreased energy  0 0  Change in appetite  0 0  Feeling bad or failure about yourself   0 0  Trouble concentrating  0 0  Moving slowly or fidgety/restless  0 0  Suicidal thoughts  0 0  PHQ-9 Score  0 0  Difficult doing work/chores  Not difficult at all Not difficult at all       11/07/2023    9:12 AM 05/07/2023    9:39 AM 10/03/2021    9:46 AM 06/28/2021    8:35 AM  GAD 7 : Generalized Anxiety Score  Nervous, Anxious, on Edge 0 0 0 0  Control/stop worrying 0 0 0 0  Worry too much - different things 0 0 0 0  Trouble relaxing 0 0 0 0  Restless 0 0 0 0  Easily annoyed or irritable  0 0 0 0  Afraid - awful might happen 0 0 0 0  Total GAD 7 Score 0 0 0 0  Anxiety Difficulty  Not difficult at all Not difficult at all Not difficult at all    Social History   Tobacco Use   Smoking status: Every Day    Current packs/day: 1.00    Average packs/day: 1 pack/day for 51.0 years (51.0 ttl pk-yrs)    Types: Cigarettes   Smokeless tobacco: Never   Tobacco comments:    Quit once for 2 years. pt using nicorette gum     0.5 PPD 12/13/2021  Vaping Use   Vaping status: Never Used  Substance Use Topics   Alcohol use: Not Currently    Alcohol/week: 1.0 - 3.0 standard drink of alcohol    Types: 1 - 3 Standard drinks or equivalent per week    Comment: half a pint of liquor every week   Drug use: No    Review of Systems Per HPI unless specifically indicated above     Objective:    BP 134/80 Comment: home reading  Pulse 73   Ht 5' 1 (1.549 m)   Wt 175 lb 6 oz (79.5  kg)   SpO2 95%   BMI 33.14 kg/m   Wt Readings from Last 3 Encounters:  11/07/23 175 lb 6 oz (79.5 kg)  07/19/23 174 lb 3.2 oz (79 kg)  05/07/23 172 lb (78 kg)    Physical Exam Vitals and nursing note reviewed.  Constitutional:      General: She is not in acute distress.    Appearance: Normal appearance. She is well-developed. She is not diaphoretic.     Comments: Well-appearing, comfortable, cooperative  HENT:     Head: Normocephalic and atraumatic.  Eyes:     General:        Right eye: No discharge.        Left eye: No discharge.     Conjunctiva/sclera: Conjunctivae normal.  Cardiovascular:     Rate and Rhythm: Normal rate.  Pulmonary:     Effort: Pulmonary effort is normal.  Skin:    General: Skin is warm and dry.     Findings: No erythema or rash.  Neurological:     Mental Status: She is alert and oriented to person, place, and time.  Psychiatric:        Mood and Affect: Mood normal.        Behavior: Behavior normal.        Thought Content: Thought content normal.     Comments: Well  groomed, good eye contact, normal speech and thoughts     Results for orders placed or performed in visit on 11/07/23  POCT HgB A1C   Collection Time: 11/07/23  9:25 AM  Result Value Ref Range   Hemoglobin A1C 5.1 4.0 - 5.6 %   HbA1c POC (<> result, manual entry)     HbA1c, POC (prediabetic range)     HbA1c, POC (controlled diabetic range)        Assessment & Plan:   Problem List Items Addressed This Visit     Elevated hemoglobin A1c - Primary   Relevant Orders   POCT HgB A1C (Completed)   Elevated LDL cholesterol level   Relevant Medications   rosuvastatin  (CRESTOR ) 5 MG tablet   Essential hypertension   Relevant Medications   valsartan  (DIOVAN ) 80 MG tablet   rosuvastatin  (CRESTOR ) 5 MG tablet   Obesity (BMI 30.0-34.9)     Hypertension Blood pressure at 144/80 mmHg. Goal: 130-140 mmHg. Managed with medication. Home BP monitoring - Continue valsartan  80 mg. - Encourage active lifestyle.  Hyperlipidemia Managed with current medication regimen. Refill Rosuvastatin  5mg   Pre-Diabetes A1c improved to 5.1 from 5.7 with lifestyle intervention       Orders Placed This Encounter  Procedures   POCT HgB A1C    Meds ordered this encounter  Medications   valsartan  (DIOVAN ) 80 MG tablet    Sig: Take 1 tablet (80 mg total) by mouth daily.    Dispense:  100 tablet    Refill:  1    Please send a replace/new response with 100-Day Supply if appropriate to maximize member benefit. Requesting 1 year supply.   rosuvastatin  (CRESTOR ) 5 MG tablet    Sig: Take 1 tablet (5 mg total) by mouth daily.    Dispense:  100 tablet    Refill:  1    Please send a replace/new response with 100-Day Supply if appropriate to maximize member benefit. Requesting 1 year supply.    Follow up plan: Return in about 6 months (around 05/06/2024) for 6 month fasting lab > 1 week later Annual Physical.  Marsa Officer, DO [place]  [facility] 11/07/2023,  11:54 AM

## 2023-11-07 NOTE — Telephone Encounter (Signed)
 I have spoke with patient, she is ok with you calling her today to do her visit.

## 2023-11-07 NOTE — Telephone Encounter (Signed)
 Copied from CRM [id number]. Topic: General - Other >> Nov 07, 2023 12:10 PM Yolanda T wrote: Reason for CRM: patient said she was returning providers call but she was leaving for work at [employer] and does not get off until 4pm. Please f/u with patient

## 2023-11-07 NOTE — Telephone Encounter (Signed)
 Called patient back to finish visit from earlier today see note  Marsa Officer, DO [facility] 11/07/2023, 1:19 PM

## 2023-12-10 ENCOUNTER — Other Ambulatory Visit: Payer: Self-pay | Admitting: Family Medicine

## 2023-12-10 DIAGNOSIS — M5441 Lumbago with sciatica, right side: Secondary | ICD-10-CM

## 2023-12-10 NOTE — Telephone Encounter (Unsigned)
 Copied from CRM [id number]. Topic: Clinical - Medication Refill >> Dec 10, 2023  9:59 AM Alfonso ORN wrote: Medication:  gabapentin  (NEURONTIN ) 100 MG capsule   Has the patient contacted their pharmacy? No  This is the patient's preferred pharmacy:   [facility] [place] [place] - [address] [place]  [address] Phone: [phone] Fax: [phone]  Is this the correct pharmacy for this prescription? Yes If no, delete pharmacy and type the correct one.   Has the prescription been filled recently? No,  Is the patient out of the medication? Yes  Has the patient been seen for an appointment in the last year OR does the patient have an upcoming appointment? No  Can we respond through MyChart? No

## 2023-12-11 NOTE — Telephone Encounter (Signed)
 Too soon for refill.  Requested Prescriptions  Pending Prescriptions Disp Refills   gabapentin  (NEURONTIN ) 100 MG capsule 90 capsule 1    Sig: Take 1 capsule (100 mg total) by mouth at bedtime.     Neurology: Anticonvulsants - gabapentin  Passed - 12/11/2023  4:08 PM      Passed - Cr in normal range and within 360 days    Creat  Date Value Ref Range Status  05/07/2023 0.65 0.60 - 1.00 mg/dL Final         Passed - Completed PHQ-2 or PHQ-9 in the last 360 days      Passed - Valid encounter within last 12 months    Recent Outpatient Visits           1 month ago Elevated hemoglobin A1c   [place] [facility] [place] Marsa PARAS, [place]   7 months ago Essential hypertension   [place] [facility] [place], Marsa PARAS, [place]

## 2024-01-20 ENCOUNTER — Other Ambulatory Visit: Payer: Self-pay | Admitting: Family Medicine

## 2024-01-20 DIAGNOSIS — I10 Essential (primary) hypertension: Secondary | ICD-10-CM

## 2024-01-22 NOTE — Telephone Encounter (Signed)
 Requested Prescriptions  Pending Prescriptions Disp Refills   amLODipine  (NORVASC ) 5 MG tablet [Pharmacy Med Name: amLODIPine  Besylate 5 MG Oral Tablet] 100 tablet 0    Sig: TAKE 1 TABLET BY MOUTH DAILY     Cardiovascular: Calcium  Channel Blockers 2 Passed - 01/22/2024  4:09 PM      Passed - Last BP in normal range    BP Readings from Last 1 Encounters:  11/07/23 134/80         Passed - Last Heart Rate in normal range    Pulse Readings from Last 1 Encounters:  11/07/23 73         Passed - Valid encounter within last 6 months    Recent Outpatient Visits           2 months ago Elevated hemoglobin A1c   [place] [facility] [place], Marsa PARAS, [place]   8 months ago Essential hypertension   [place] [facility] [place], [place] PAR[place], [place]

## 2024-04-08 ENCOUNTER — Encounter: Payer: Self-pay | Admitting: Family Medicine

## 2024-04-08 ENCOUNTER — Ambulatory Visit: Admitting: Family Medicine

## 2024-04-08 VITALS — BP 124/82 | HR 104 | Ht 61.0 in | Wt 162.1 lb

## 2024-04-08 DIAGNOSIS — M5441 Lumbago with sciatica, right side: Secondary | ICD-10-CM

## 2024-04-08 DIAGNOSIS — M5136 Other intervertebral disc degeneration, lumbar region with discogenic back pain only: Secondary | ICD-10-CM | POA: Diagnosis not present

## 2024-04-08 DIAGNOSIS — G4486 Cervicogenic headache: Secondary | ICD-10-CM

## 2024-04-08 MED ORDER — CYCLOBENZAPRINE HCL 5 MG PO TABS: 5.0000 mg | ORAL_TABLET | Freq: Three times a day (TID) | ORAL | 1 refills | Status: AC | PRN

## 2024-04-08 MED ORDER — NAPROXEN 500 MG PO TABS: 500.0000 mg | ORAL_TABLET | Freq: Two times a day (BID) | ORAL | 1 refills | Status: AC

## 2024-04-08 MED ORDER — GABAPENTIN 300 MG PO CAPS: 300.0000 mg | ORAL_CAPSULE | Freq: Every day | ORAL | 1 refills | Status: AC

## 2024-05-02 ENCOUNTER — Ambulatory Visit: Payer: 59

## 2024-05-07 ENCOUNTER — Ambulatory Visit

## 2024-07-18 ENCOUNTER — Other Ambulatory Visit

## 2024-07-24 ENCOUNTER — Ambulatory Visit: Admitting: Radiation Oncology
